# Patient Record
Sex: Male | Born: 1945 | Race: Black or African American | Hispanic: No | Marital: Married | Smoking: Never smoker
Health system: Southern US, Community
[De-identification: ages and names within clinical notes are randomized; demographics above are authoritative.]

## PROBLEM LIST (undated history)

## (undated) DIAGNOSIS — E119 Type 2 diabetes mellitus without complications: Secondary | ICD-10-CM

## (undated) DIAGNOSIS — I10 Essential (primary) hypertension: Secondary | ICD-10-CM

---

## 2005-08-11 ENCOUNTER — Inpatient Hospital Stay (HOSPITAL_COMMUNITY): Admission: EM | Admit: 2005-08-11 | Discharge: 2005-08-15 | Payer: Self-pay | Admitting: Emergency Medicine

## 2006-04-10 ENCOUNTER — Emergency Department (HOSPITAL_COMMUNITY): Admission: EM | Admit: 2006-04-10 | Discharge: 2006-04-10 | Payer: Self-pay | Admitting: Emergency Medicine

## 2006-05-31 IMAGING — CR DG CHEST 2V
2 series · 2 of 2 positions shown · non-contrast
Comparison: No available priors.

CLINICAL DATA: Weight loss, weakness, and productive cough.  
CHEST - 2 VIEW:

[view not recorded (1 of 2)]
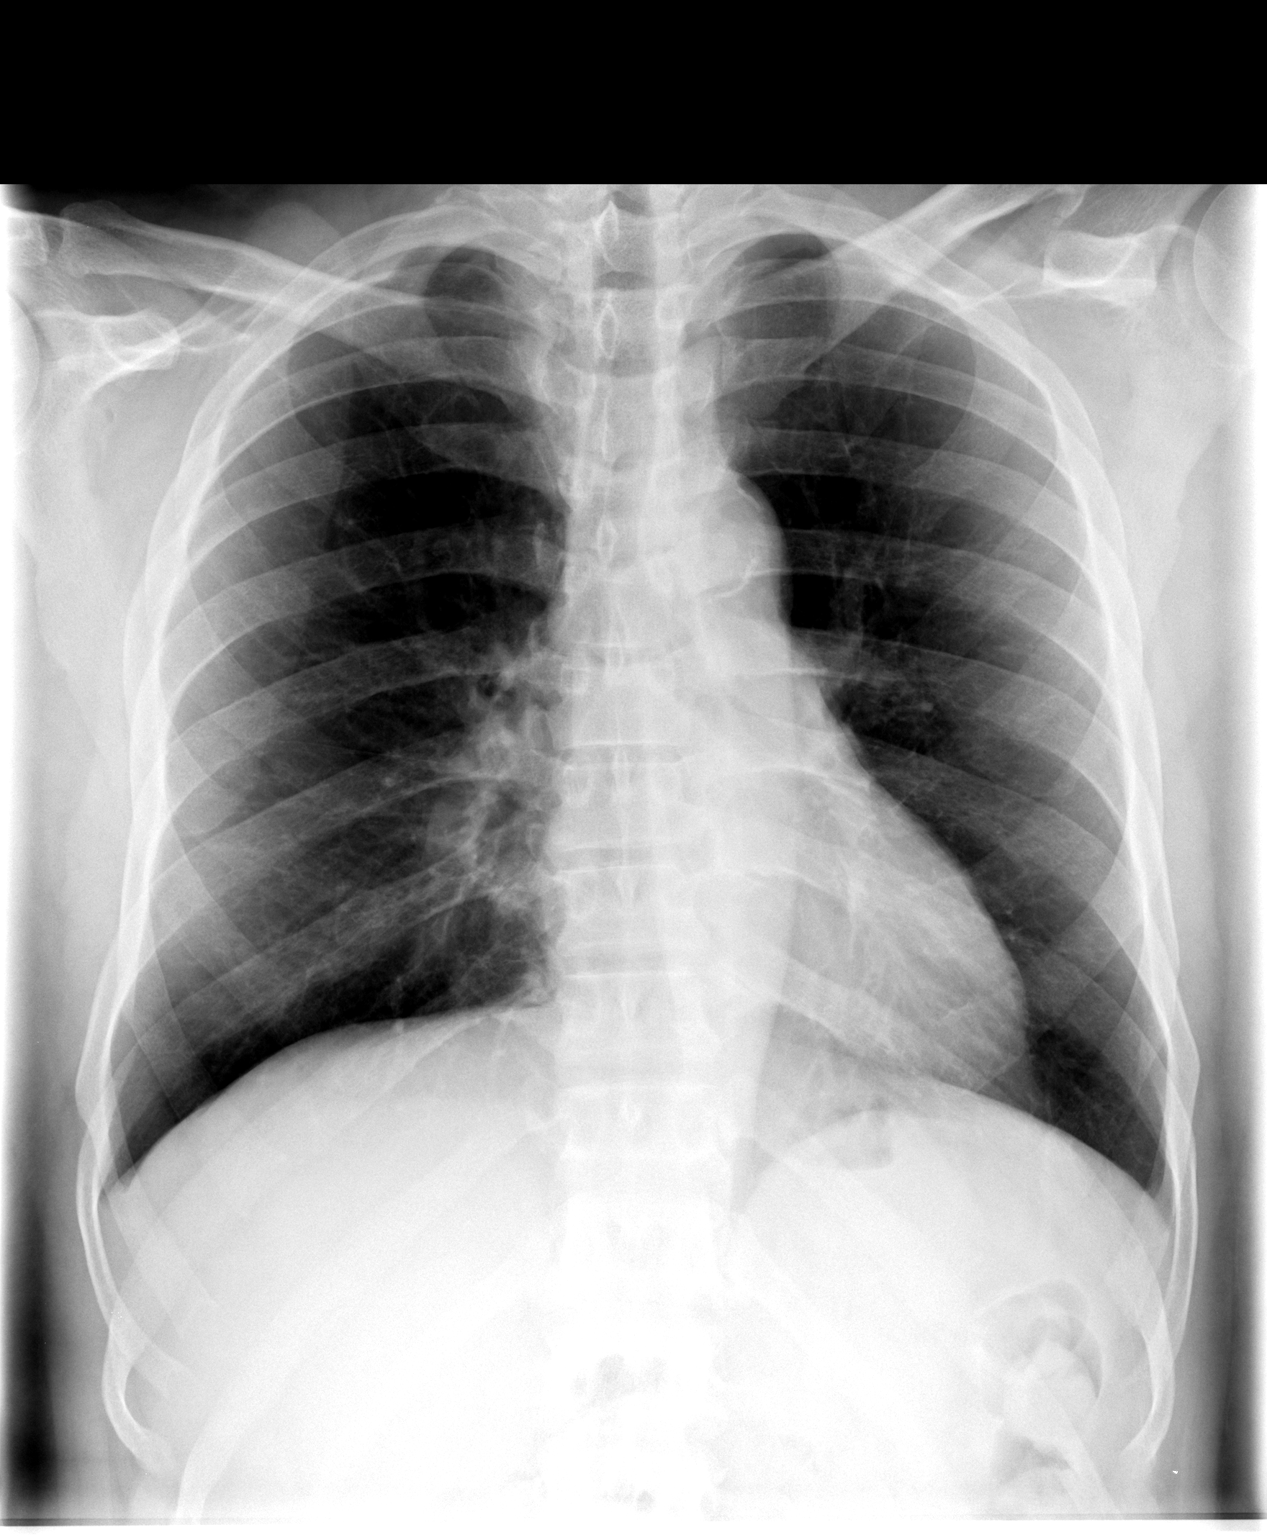

[view not recorded (2 of 2)]
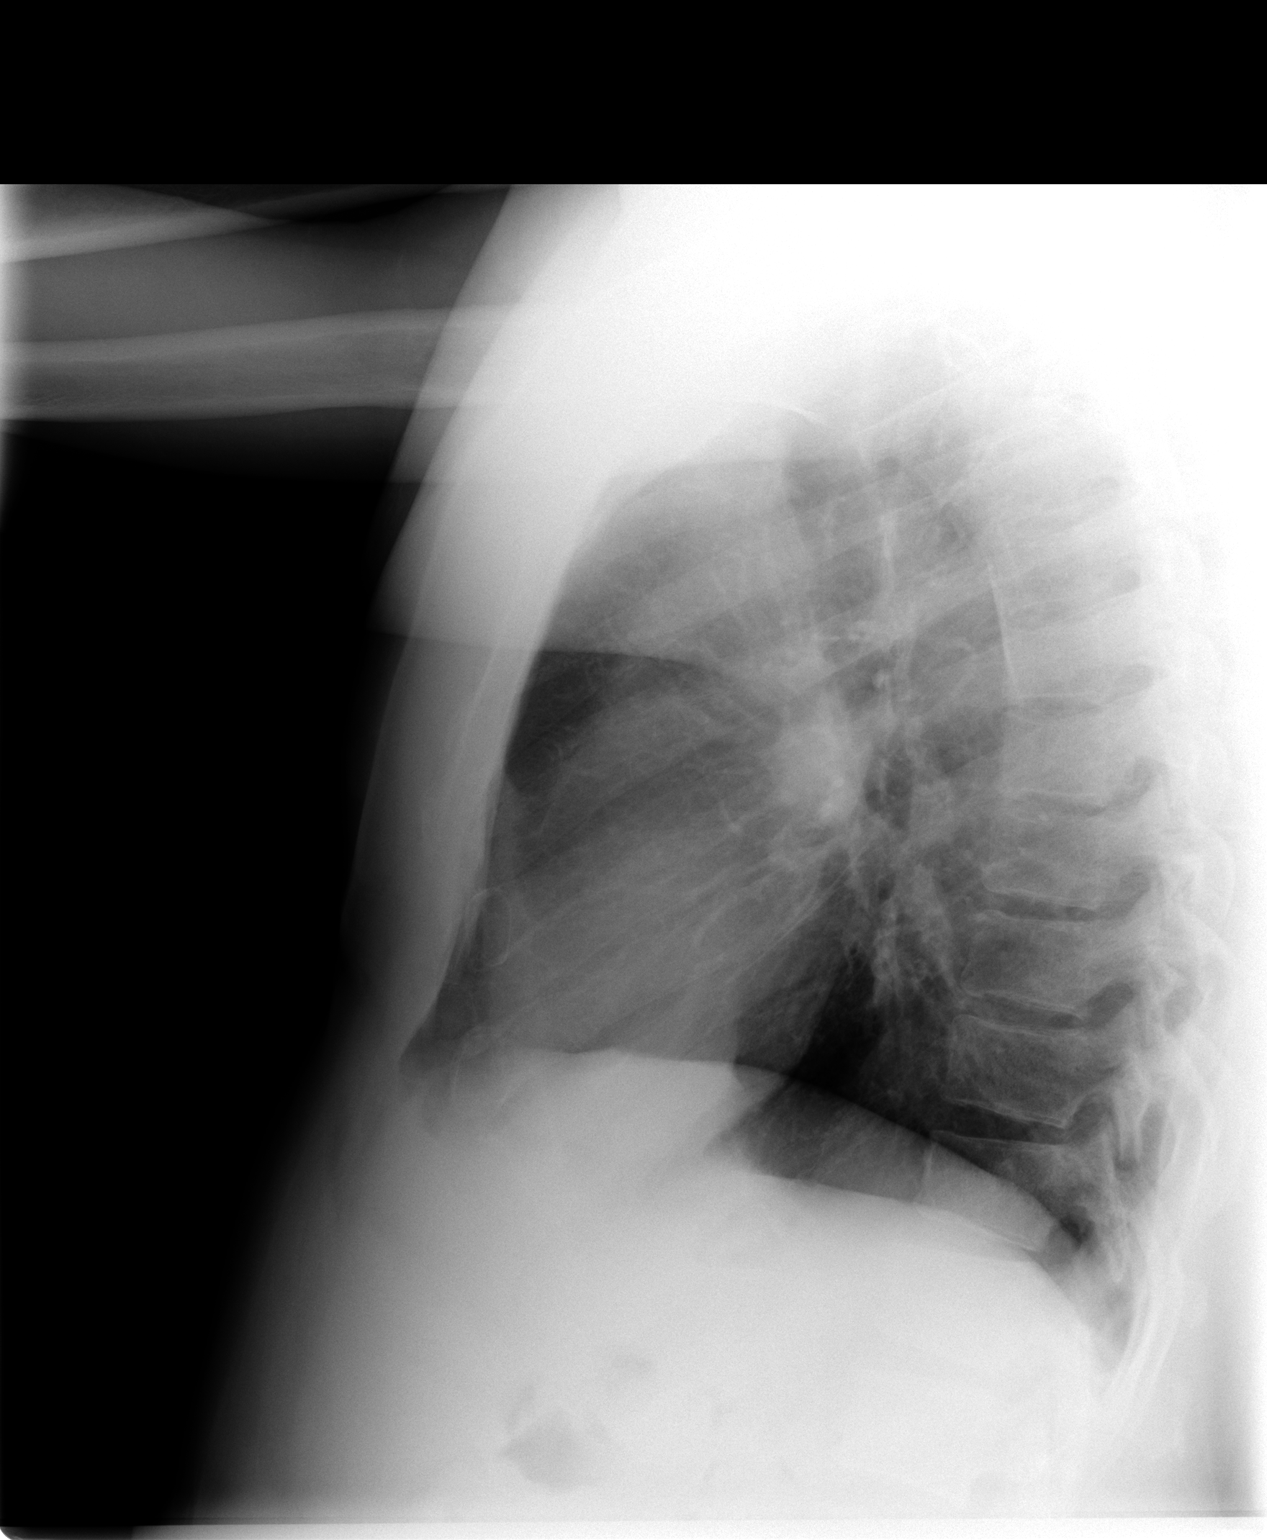

[2 of 2 positions shown; findings below may reference images not displayed]

FINDINGS: Trachea is midline.  Heart size is within normal limits.  Lungs are clear.  No pleural fluid.
IMPRESSION: No acute cardiopulmonary process.

## 2021-10-05 ENCOUNTER — Emergency Department (HOSPITAL_COMMUNITY)
Admission: EM | Admit: 2021-10-05 | Discharge: 2021-10-05 | Disposition: A | Discharge status: Home / Self Care | Payer: Medicare PPO | Attending: Emergency Medicine | Admitting: Emergency Medicine

## 2021-10-05 ENCOUNTER — Encounter (HOSPITAL_COMMUNITY): Payer: Self-pay | Admitting: *Deleted

## 2021-10-05 DIAGNOSIS — I10 Essential (primary) hypertension: Secondary | ICD-10-CM | POA: Diagnosis not present

## 2021-10-05 DIAGNOSIS — R2232 Localized swelling, mass and lump, left upper limb: Secondary | ICD-10-CM | POA: Insufficient documentation

## 2021-10-05 DIAGNOSIS — R030 Elevated blood-pressure reading, without diagnosis of hypertension: Secondary | ICD-10-CM | POA: Insufficient documentation

## 2021-10-05 HISTORY — DX: Essential (primary) hypertension: I10

## 2021-10-05 LAB — CBC WITH DIFFERENTIAL/PLATELET
Abs Immature Granulocytes: 0.04 10*3/uL (ref 0.00–0.07)
Basophils Absolute: 0 10*3/uL (ref 0.0–0.1)
Basophils Relative: 0 %
Eosinophils Absolute: 0.3 10*3/uL (ref 0.0–0.5)
Eosinophils Relative: 3 %
HCT: 36.1 % — ABNORMAL LOW (ref 39.0–52.0)
Hemoglobin: 11.8 g/dL — ABNORMAL LOW (ref 13.0–17.0)
Immature Granulocytes: 0 %
Lymphocytes Relative: 18 %
Lymphs Abs: 1.7 10*3/uL (ref 0.7–4.0)
MCH: 30.1 pg (ref 26.0–34.0)
MCHC: 32.7 g/dL (ref 30.0–36.0)
MCV: 92.1 fL (ref 80.0–100.0)
Monocytes Absolute: 0.9 10*3/uL (ref 0.1–1.0)
Monocytes Relative: 10 %
Neutro Abs: 6.8 10*3/uL (ref 1.7–7.7)
Neutrophils Relative %: 69 %
Platelets: 230 10*3/uL (ref 150–400)
RBC: 3.92 MIL/uL — ABNORMAL LOW (ref 4.22–5.81)
RDW: 14.4 % (ref 11.5–15.5)
WBC: 9.8 10*3/uL (ref 4.0–10.5)
nRBC: 0 % (ref 0.0–0.2)

## 2021-10-05 LAB — BASIC METABOLIC PANEL
Anion gap: 7 (ref 5–15)
BUN: 24 mg/dL — ABNORMAL HIGH (ref 8–23)
CO2: 26 mmol/L (ref 22–32)
Calcium: 9.8 mg/dL (ref 8.9–10.3)
Chloride: 103 mmol/L (ref 98–111)
Creatinine, Ser: 1.52 mg/dL — ABNORMAL HIGH (ref 0.61–1.24)
GFR, Estimated: 47 mL/min — ABNORMAL LOW (ref >60)
Glucose, Bld: 119 mg/dL — ABNORMAL HIGH (ref 70–99)
Potassium: 3.7 mmol/L (ref 3.5–5.1)
Sodium: 136 mmol/L (ref 135–145)

## 2021-10-05 MED ORDER — TRIAMCINOLONE ACETONIDE 0.025 % EX OINT: TOPICAL_OINTMENT | Freq: Two times a day (BID) | CUTANEOUS | 0 refills | Status: DC | PRN

## 2021-10-05 NOTE — ED Triage Notes (Signed)
States he had his blood pressure checked at work and it was up, states he does not have a lot of energy

## 2021-10-05 NOTE — Discharge Instructions (Addendum)
Exam was reassuring, your creatinine was slightly elevated, recommend continued stay hydrated, please continue with all medication as prescribed to you.  I have given a steroid cream please use as needed for the itchiness on your hand.  You may follow-up with your PCP for further evaluation of your blood pressure as well as your creatinine recommend follow-up in next 3 weeks time.  Come back to the emergency department if you develop chest pain, shortness of breath, severe abdominal pain, uncontrolled nausea, vomiting, diarrhea.

## 2021-10-05 NOTE — ED Triage Notes (Signed)
Patient has his blood pressure checked at the [employer] where he works and states it has been high at intervals. Recent change to blood pressure medication by PCP
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
## 2023-10-09 ENCOUNTER — Emergency Department (HOSPITAL_COMMUNITY): Payer: Medicare PPO

## 2023-10-09 ENCOUNTER — Other Ambulatory Visit: Payer: Self-pay

## 2023-10-09 ENCOUNTER — Emergency Department (HOSPITAL_COMMUNITY)
Admission: EM | Admit: 2023-10-09 | Discharge: 2023-10-09 | Disposition: A | Discharge status: Home / Self Care | Payer: Medicare PPO | Attending: Emergency Medicine | Admitting: Emergency Medicine

## 2023-10-09 ENCOUNTER — Encounter (HOSPITAL_COMMUNITY): Payer: Self-pay

## 2023-10-09 DIAGNOSIS — M79604 Pain in right leg: Secondary | ICD-10-CM | POA: Diagnosis present

## 2023-10-09 MED ORDER — IBUPROFEN 800 MG PO TABS: 800.0000 mg | ORAL_TABLET | Freq: Three times a day (TID) | ORAL | 0 refills | Status: DC | PRN

## 2023-10-09 MED ORDER — IBUPROFEN 800 MG PO TABS
800.0000 mg | ORAL_TABLET | Freq: Once | ORAL | Status: AC
  Administered 2023-10-09: 800 mg via ORAL
  Filled 2023-10-09: qty 1

## 2023-10-09 NOTE — ED Provider Notes (Signed)
EMERGENCY DEPARTMENT AT [facility] Provider Note   CSN: [id number] Arrival date & time: 10/09/23  0848     History  Chief Complaint  Patient presents with   Leg Pain    Manuel Winters is a 77 y.o. male.  Patient complains of right lower leg pain for the last couple weeks.  Patient has no medical problems  The history is provided by the patient and medical records.  Leg Pain Location:  Leg Injury: no   Leg location:  R leg Pain details:    Quality:  Aching   Radiates to:  Does not radiate   Severity:  Moderate   Onset quality:  Sudden   Timing:  Constant   Progression:  Worsening Chronicity:  New Foreign body present:  No foreign bodies Associated symptoms: no back pain and no fatigue        Home Medications Prior to Admission medications   Medication Sig Start Date End Date Taking? Authorizing Provider  ibuprofen (ADVIL) 800 MG tablet Take 1 tablet (800 mg total) by mouth every 8 (eight) hours as needed. 10/09/23  Yes Bethann Berkshire, MD  triamcinolone oint 0.025%-Cerave equivalent cream 1:1 mixture Apply topically 2 (two) times daily as needed for itching. 10/05/21   Carroll Sage, PA-C      Allergies    Patient has no known allergies.    Review of Systems   Review of Systems  Constitutional:  Negative for appetite change and fatigue.  HENT:  Negative for congestion, ear discharge and sinus pressure.   Eyes:  Negative for discharge.  Respiratory:  Negative for cough.   Cardiovascular:  Negative for chest pain.  Gastrointestinal:  Negative for abdominal pain and diarrhea.  Genitourinary:  Negative for frequency and hematuria.  Musculoskeletal:  Negative for back pain.       Right lower leg pain  Skin:  Negative for rash.  Neurological:  Negative for seizures and headaches.  Psychiatric/Behavioral:  Negative for hallucinations.     Physical Exam Updated Vital Signs BP (!) 164/83   Pulse 68   Temp 98.3 F (36.8 C) (Oral)    Resp 18   Ht 5\' 8"  (1.727 m)   Wt 73.5 kg   SpO2 99%   BMI 24.63 kg/m  Physical Exam Vitals and nursing note reviewed.  Constitutional:      Appearance: He is well-developed.  HENT:     Head: Normocephalic.     Nose: Nose normal.  Eyes:     General: No scleral icterus.    Conjunctiva/sclera: Conjunctivae normal.  Neck:     Thyroid: No thyromegaly.  Cardiovascular:     Rate and Rhythm: Normal rate and regular rhythm.     Heart sounds: No murmur heard.    No friction rub. No gallop.  Pulmonary:     Breath sounds: No stridor. No wheezing or rales.  Chest:     Chest wall: No tenderness.  Abdominal:     General: There is no distension.     Tenderness: There is no abdominal tenderness. There is no rebound.  Musculoskeletal:        General: Normal range of motion.     Cervical back: Neck supple.     Comments: Tender right calf  Lymphadenopathy:     Cervical: No cervical adenopathy.  Skin:    Findings: No erythema or rash.  Neurological:     Mental Status: He is alert and oriented to person, place, and time.  Motor: No abnormal muscle tone.     Coordination: Coordination normal.  Psychiatric:        Behavior: Behavior normal.     ED Results / Procedures / Treatments   Labs (all labs ordered are listed, but only abnormal results are displayed) Labs Reviewed - No data to display  EKG None  Radiology US Venous Img Lower Unilateral Right Result Date: 10/09/2023 CLINICAL DATA:  Right lower leg pain. EXAM: RIGHT LOWER EXTREMITY VENOUS DOPPLER ULTRASOUND TECHNIQUE: Gray-scale sonography with compression, as well as color and duplex ultrasound, were performed to evaluate the deep venous system(s) from the level of the common femoral vein through the popliteal and proximal calf veins. COMPARISON:  None Available. FINDINGS: VENOUS Normal compressibility of the common femoral, superficial femoral, and popliteal veins, as well as the visualized calf veins. Visualized  portions of profunda femoral vein and great saphenous vein unremarkable. No filling defects to suggest DVT on grayscale or color Doppler imaging. Doppler waveforms show normal direction of venous flow, normal respiratory plasticity and response to augmentation. Limited views of the contralateral common femoral vein are unremarkable. OTHER None. Limitations: None. IMPRESSION: Negative. Electronically Signed   By: Obie Dredge M.D.   On: 10/09/2023 11:30   DG Tibia/Fibula Right Result Date: 10/09/2023 CLINICAL DATA:  Pain EXAM: RIGHT TIBIA AND FIBULA - 2 VIEW COMPARISON:  None Available. FINDINGS: No fracture or dislocation. Preserved joint spaces and bone mineralization. There is a tiny radiopaque density seen in the soft tissues posterior to the distal fibula. Please correlate for a foreign body. IMPRESSION: No acute osseous abnormality. Possible radiopaque foreign body in the soft tissues posterior to the distal fibula. Electronically Signed   By: Karen Kays M.D.   On: 10/09/2023 10:37    Procedures Procedures    Medications Ordered in ED Medications  ibuprofen (ADVIL) tablet 800 mg (has no administration in time range)    ED Course/ Medical Decision Making/ A&P                                 Medical Decision Making Amount and/or Complexity of Data Reviewed Radiology: ordered.  Risk Prescription drug management.   Patient with painful right lower leg.  DVT ruled out.  Patient given Motrin and will follow-up with PCP        Final Clinical Impression(s) / ED Diagnoses Final diagnoses:  Right leg pain    Rx / DC Orders ED Discharge Orders          Ordered    ibuprofen (ADVIL) 800 MG tablet  Every 8 hours PRN        10/09/23 1359              Bethann Berkshire, MD 10/09/23 1727
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
## 2023-10-09 NOTE — Discharge Instructions (Signed)
Follow-up with your family doctor in 1 to 2 weeks for recheck 

## 2023-11-05 ENCOUNTER — Inpatient Hospital Stay (HOSPITAL_COMMUNITY)
Admission: EM | Admit: 2023-11-05 | Discharge: 2023-11-17 | DRG: 253 | Disposition: A | Discharge status: Home Health | Payer: Medicare PPO | Attending: Internal Medicine | Admitting: Internal Medicine

## 2023-11-05 ENCOUNTER — Emergency Department (HOSPITAL_COMMUNITY): Payer: Medicare PPO

## 2023-11-05 ENCOUNTER — Other Ambulatory Visit: Payer: Self-pay

## 2023-11-05 ENCOUNTER — Encounter (HOSPITAL_COMMUNITY): Payer: Self-pay

## 2023-11-05 DIAGNOSIS — I82441 Acute embolism and thrombosis of right tibial vein: Secondary | ICD-10-CM | POA: Diagnosis present

## 2023-11-05 DIAGNOSIS — E1122 Type 2 diabetes mellitus with diabetic chronic kidney disease: Secondary | ICD-10-CM | POA: Diagnosis present

## 2023-11-05 DIAGNOSIS — Z7901 Long term (current) use of anticoagulants: Secondary | ICD-10-CM

## 2023-11-05 DIAGNOSIS — E1151 Type 2 diabetes mellitus with diabetic peripheral angiopathy without gangrene: Secondary | ICD-10-CM | POA: Diagnosis present

## 2023-11-05 DIAGNOSIS — K219 Gastro-esophageal reflux disease without esophagitis: Secondary | ICD-10-CM | POA: Diagnosis present

## 2023-11-05 DIAGNOSIS — R509 Fever, unspecified: Secondary | ICD-10-CM | POA: Diagnosis not present

## 2023-11-05 DIAGNOSIS — N179 Acute kidney failure, unspecified: Secondary | ICD-10-CM | POA: Diagnosis present

## 2023-11-05 DIAGNOSIS — E785 Hyperlipidemia, unspecified: Secondary | ICD-10-CM | POA: Diagnosis present

## 2023-11-05 DIAGNOSIS — I129 Hypertensive chronic kidney disease with stage 1 through stage 4 chronic kidney disease, or unspecified chronic kidney disease: Secondary | ICD-10-CM | POA: Diagnosis present

## 2023-11-05 DIAGNOSIS — I480 Paroxysmal atrial fibrillation: Secondary | ICD-10-CM | POA: Diagnosis not present

## 2023-11-05 DIAGNOSIS — I5A Non-ischemic myocardial injury (non-traumatic): Secondary | ICD-10-CM | POA: Diagnosis not present

## 2023-11-05 DIAGNOSIS — I70221 Atherosclerosis of native arteries of extremities with rest pain, right leg: Principal | ICD-10-CM | POA: Diagnosis present

## 2023-11-05 DIAGNOSIS — I70209 Unspecified atherosclerosis of native arteries of extremities, unspecified extremity: Secondary | ICD-10-CM | POA: Diagnosis present

## 2023-11-05 DIAGNOSIS — M6282 Rhabdomyolysis: Secondary | ICD-10-CM | POA: Diagnosis present

## 2023-11-05 DIAGNOSIS — Z79899 Other long term (current) drug therapy: Secondary | ICD-10-CM | POA: Diagnosis not present

## 2023-11-05 DIAGNOSIS — I82431 Acute embolism and thrombosis of right popliteal vein: Secondary | ICD-10-CM | POA: Diagnosis present

## 2023-11-05 DIAGNOSIS — I4892 Unspecified atrial flutter: Secondary | ICD-10-CM | POA: Diagnosis present

## 2023-11-05 DIAGNOSIS — K5900 Constipation, unspecified: Secondary | ICD-10-CM | POA: Diagnosis not present

## 2023-11-05 DIAGNOSIS — I82411 Acute embolism and thrombosis of right femoral vein: Secondary | ICD-10-CM | POA: Diagnosis present

## 2023-11-05 DIAGNOSIS — N1831 Chronic kidney disease, stage 3a: Secondary | ICD-10-CM | POA: Diagnosis present

## 2023-11-05 DIAGNOSIS — M48061 Spinal stenosis, lumbar region without neurogenic claudication: Secondary | ICD-10-CM | POA: Diagnosis present

## 2023-11-05 DIAGNOSIS — E8809 Other disorders of plasma-protein metabolism, not elsewhere classified: Secondary | ICD-10-CM | POA: Diagnosis present

## 2023-11-05 DIAGNOSIS — R7989 Other specified abnormal findings of blood chemistry: Secondary | ICD-10-CM | POA: Diagnosis not present

## 2023-11-05 DIAGNOSIS — I10 Essential (primary) hypertension: Secondary | ICD-10-CM | POA: Diagnosis not present

## 2023-11-05 DIAGNOSIS — I4891 Unspecified atrial fibrillation: Secondary | ICD-10-CM | POA: Diagnosis present

## 2023-11-05 DIAGNOSIS — D62 Acute posthemorrhagic anemia: Secondary | ICD-10-CM | POA: Diagnosis not present

## 2023-11-05 DIAGNOSIS — N170 Acute kidney failure with tubular necrosis: Secondary | ICD-10-CM | POA: Diagnosis present

## 2023-11-05 DIAGNOSIS — I743 Embolism and thrombosis of arteries of the lower extremities: Secondary | ICD-10-CM | POA: Diagnosis not present

## 2023-11-05 DIAGNOSIS — M21371 Foot drop, right foot: Secondary | ICD-10-CM | POA: Diagnosis present

## 2023-11-05 DIAGNOSIS — I998 Other disorder of circulatory system: Secondary | ICD-10-CM | POA: Diagnosis not present

## 2023-11-05 DIAGNOSIS — E876 Hypokalemia: Secondary | ICD-10-CM | POA: Diagnosis present

## 2023-11-05 DIAGNOSIS — E872 Acidosis, unspecified: Secondary | ICD-10-CM | POA: Diagnosis present

## 2023-11-05 DIAGNOSIS — E119 Type 2 diabetes mellitus without complications: Secondary | ICD-10-CM | POA: Diagnosis not present

## 2023-11-05 DIAGNOSIS — H409 Unspecified glaucoma: Secondary | ICD-10-CM | POA: Diagnosis present

## 2023-11-05 HISTORY — DX: Type 2 diabetes mellitus without complications: E11.9

## 2023-11-05 LAB — CBC WITH DIFFERENTIAL/PLATELET
Abs Immature Granulocytes: 0.04 10*3/uL (ref 0.00–0.07)
Basophils Absolute: 0 10*3/uL (ref 0.0–0.1)
Basophils Relative: 0 %
Eosinophils Absolute: 0 10*3/uL (ref 0.0–0.5)
Eosinophils Relative: 0 %
HCT: 39.5 % (ref 39.0–52.0)
Hemoglobin: 13.3 g/dL (ref 13.0–17.0)
Immature Granulocytes: 0 %
Lymphocytes Relative: 15 %
Lymphs Abs: 1.5 10*3/uL (ref 0.7–4.0)
MCH: 29.5 pg (ref 26.0–34.0)
MCHC: 33.7 g/dL (ref 30.0–36.0)
MCV: 87.6 fL (ref 80.0–100.0)
Monocytes Absolute: 0.7 10*3/uL (ref 0.1–1.0)
Monocytes Relative: 6 %
Neutro Abs: 8 10*3/uL — ABNORMAL HIGH (ref 1.7–7.7)
Neutrophils Relative %: 79 %
Platelets: 244 10*3/uL (ref 150–400)
RBC: 4.51 MIL/uL (ref 4.22–5.81)
RDW: 13.2 % (ref 11.5–15.5)
WBC: 10.2 10*3/uL (ref 4.0–10.5)
nRBC: 0 % (ref 0.0–0.2)

## 2023-11-05 LAB — BASIC METABOLIC PANEL
Anion gap: 14 (ref 5–15)
BUN: 43 mg/dL — ABNORMAL HIGH (ref 8–23)
CO2: 18 mmol/L — ABNORMAL LOW (ref 22–32)
Calcium: 10.4 mg/dL — ABNORMAL HIGH (ref 8.9–10.3)
Chloride: 102 mmol/L (ref 98–111)
Creatinine, Ser: 2.19 mg/dL — ABNORMAL HIGH (ref 0.61–1.24)
GFR, Estimated: 30 mL/min — ABNORMAL LOW (ref >60)
Glucose, Bld: 151 mg/dL — ABNORMAL HIGH (ref 70–99)
Potassium: 3.9 mmol/L (ref 3.5–5.1)
Sodium: 134 mmol/L — ABNORMAL LOW (ref 135–145)

## 2023-11-05 LAB — LACTIC ACID, PLASMA
Lactic Acid, Venous: 1.9 mmol/L (ref 0.5–1.9)
Lactic Acid, Venous: 2.1 mmol/L (ref 0.5–1.9)

## 2023-11-05 LAB — HEPATIC FUNCTION PANEL
ALT: 19 U/L (ref 0–44)
AST: 114 U/L — ABNORMAL HIGH (ref 15–41)
Albumin: 3.7 g/dL (ref 3.5–5.0)
Alkaline Phosphatase: 60 U/L (ref 38–126)
Bilirubin, Direct: 0.1 mg/dL (ref 0.0–0.2)
Indirect Bilirubin: 0.5 mg/dL (ref 0.3–0.9)
Total Bilirubin: 0.6 mg/dL (ref 0.0–1.2)
Total Protein: 8.3 g/dL — ABNORMAL HIGH (ref 6.5–8.1)

## 2023-11-05 LAB — MAGNESIUM: Magnesium: 1.8 mg/dL (ref 1.7–2.4)

## 2023-11-05 LAB — TROPONIN I (HIGH SENSITIVITY)
Troponin I (High Sensitivity): 22 ng/L — ABNORMAL HIGH (ref <18)
Troponin I (High Sensitivity): 26 ng/L — ABNORMAL HIGH (ref <18)

## 2023-11-05 LAB — PROTIME-INR
INR: 1.1 (ref 0.8–1.2)
Prothrombin Time: 14.2 s (ref 11.4–15.2)

## 2023-11-05 MED ORDER — SODIUM CHLORIDE 0.9 % IV BOLUS
500.0000 mL | Freq: Once | INTRAVENOUS | Status: AC
  Administered 2023-11-05: 500 mL via INTRAVENOUS

## 2023-11-05 MED ORDER — FENTANYL CITRATE PF 50 MCG/ML IJ SOSY
50.0000 ug | PREFILLED_SYRINGE | Freq: Once | INTRAMUSCULAR | Status: AC
  Administered 2023-11-05: 50 ug via INTRAVENOUS
  Filled 2023-11-05: qty 1

## 2023-11-05 MED ORDER — LACTATED RINGERS IV BOLUS: 1000.0000 mL | Freq: Once | INTRAVENOUS | Status: DC

## 2023-11-05 MED ORDER — HYDROMORPHONE HCL 1 MG/ML IJ SOLN
0.5000 mg | INTRAMUSCULAR | Status: DC | PRN
  Administered 2023-11-06 – 2023-11-15 (×20): 0.5 mg via INTRAVENOUS
  Filled 2023-11-05 (×20): qty 0.5

## 2023-11-05 MED ORDER — ACETAMINOPHEN 500 MG PO TABS
1000.0000 mg | ORAL_TABLET | Freq: Four times a day (QID) | ORAL | Status: DC | PRN
  Administered 2023-11-12 – 2023-11-13 (×3): 1000 mg via ORAL
  Filled 2023-11-05 (×3): qty 2

## 2023-11-05 MED ORDER — DILTIAZEM HCL-DEXTROSE 125-5 MG/125ML-% IV SOLN (PREMIX)
5.0000 mg/h | INTRAVENOUS | Status: AC
  Administered 2023-11-05: 5 mg/h via INTRAVENOUS
  Filled 2023-11-05: qty 125

## 2023-11-05 MED ORDER — LATANOPROST 0.005 % OP SOLN
1.0000 [drp] | Freq: Every day | OPHTHALMIC | Status: DC
  Administered 2023-11-05 – 2023-11-16 (×11): 1 [drp] via OPHTHALMIC
  Filled 2023-11-05 (×2): qty 2.5

## 2023-11-05 MED ORDER — SODIUM CHLORIDE 0.9 % IV SOLN: INTRAVENOUS | Status: DC

## 2023-11-05 MED ORDER — DILTIAZEM HCL 30 MG PO TABS
60.0000 mg | ORAL_TABLET | Freq: Four times a day (QID) | ORAL | Status: DC
  Administered 2023-11-05 – 2023-11-07 (×3): 60 mg via ORAL
  Filled 2023-11-05: qty 1
  Filled 2023-11-05: qty 2
  Filled 2023-11-05: qty 1
  Filled 2023-11-05: qty 2

## 2023-11-05 MED ORDER — OXYCODONE HCL 5 MG PO TABS
5.0000 mg | ORAL_TABLET | ORAL | Status: DC | PRN
  Administered 2023-11-06 – 2023-11-08 (×4): 5 mg via ORAL
  Filled 2023-11-05 (×3): qty 1

## 2023-11-05 MED ORDER — HEPARIN (PORCINE) 25000 UT/250ML-% IV SOLN
1300.0000 [IU]/h | INTRAVENOUS | Status: DC
  Administered 2023-11-05: 1250 [IU]/h via INTRAVENOUS
  Administered 2023-11-07: 1200 [IU]/h via INTRAVENOUS
  Administered 2023-11-08: 1050 [IU]/h via INTRAVENOUS
  Administered 2023-11-09: 1100 [IU]/h via INTRAVENOUS
  Administered 2023-11-11 – 2023-11-12 (×2): 1300 [IU]/h via INTRAVENOUS
  Filled 2023-11-05 (×9): qty 250

## 2023-11-05 MED ORDER — HYDROMORPHONE HCL 1 MG/ML IJ SOLN
1.0000 mg | Freq: Once | INTRAMUSCULAR | Status: AC
  Administered 2023-11-05: 1 mg via INTRAVENOUS
  Filled 2023-11-05: qty 1

## 2023-11-05 MED ORDER — HYDROMORPHONE HCL 1 MG/ML IJ SOLN
0.5000 mg | Freq: Once | INTRAMUSCULAR | Status: AC
  Administered 2023-11-05: 0.5 mg via INTRAVENOUS
  Filled 2023-11-05: qty 0.5

## 2023-11-05 MED ORDER — IOHEXOL 350 MG/ML SOLN
80.0000 mL | Freq: Once | INTRAVENOUS | Status: AC | PRN
  Administered 2023-11-05: 100 mL via INTRAVENOUS

## 2023-11-05 MED ORDER — ROSUVASTATIN CALCIUM 5 MG PO TABS
10.0000 mg | ORAL_TABLET | Freq: Every day | ORAL | Status: DC
Start: 2023-11-05 — End: 2023-11-07
  Administered 2023-11-05 – 2023-11-06 (×2): 10 mg via ORAL
  Filled 2023-11-05: qty 2
  Filled 2023-11-05: qty 1

## 2023-11-05 MED ORDER — SODIUM CHLORIDE 0.9% FLUSH
3.0000 mL | Freq: Two times a day (BID) | INTRAVENOUS | Status: DC
  Administered 2023-11-05 – 2023-11-17 (×19): 3 mL via INTRAVENOUS

## 2023-11-05 MED ORDER — OXYCODONE HCL 5 MG PO TABS
2.5000 mg | ORAL_TABLET | ORAL | Status: DC | PRN
  Administered 2023-11-07: 2.5 mg via ORAL
  Filled 2023-11-05 (×2): qty 1

## 2023-11-05 MED ORDER — MELATONIN 3 MG PO TABS
6.0000 mg | ORAL_TABLET | Freq: Every evening | ORAL | Status: DC | PRN
  Administered 2023-11-09 – 2023-11-14 (×4): 6 mg via ORAL
  Filled 2023-11-05 (×4): qty 2

## 2023-11-05 MED ORDER — ONDANSETRON HCL 4 MG/2ML IJ SOLN
4.0000 mg | Freq: Once | INTRAMUSCULAR | Status: AC
  Administered 2023-11-05: 4 mg via INTRAVENOUS
  Filled 2023-11-05: qty 2

## 2023-11-05 MED ORDER — SODIUM CHLORIDE 0.9 % IV BOLUS
1000.0000 mL | Freq: Once | INTRAVENOUS | Status: AC
  Administered 2023-11-05: 1000 mL via INTRAVENOUS

## 2023-11-05 MED ORDER — HEPARIN BOLUS VIA INFUSION
5100.0000 [IU] | Freq: Once | INTRAVENOUS | Status: AC
  Administered 2023-11-05: 5100 [IU] via INTRAVENOUS

## 2023-11-05 MED ORDER — PANTOPRAZOLE SODIUM 40 MG PO TBEC
40.0000 mg | DELAYED_RELEASE_TABLET | Freq: Every day | ORAL | Status: DC
Start: 2023-11-06 — End: 2023-11-17
  Administered 2023-11-07 – 2023-11-17 (×11): 40 mg via ORAL
  Filled 2023-11-05 (×11): qty 1

## 2023-11-05 MED ORDER — ONDANSETRON HCL 4 MG/2ML IJ SOLN: 4.0000 mg | Freq: Four times a day (QID) | INTRAMUSCULAR | Status: DC | PRN

## 2023-11-05 MED ORDER — POLYETHYLENE GLYCOL 3350 17 G PO PACK
17.0000 g | PACK | Freq: Every day | ORAL | Status: DC | PRN
  Administered 2023-11-08 – 2023-11-11 (×4): 17 g via ORAL
  Filled 2023-11-05 (×4): qty 1

## 2023-11-05 MED ORDER — DILTIAZEM LOAD VIA INFUSION
10.0000 mg | Freq: Once | INTRAVENOUS | Status: AC
Start: 2023-11-05 — End: 2023-11-05
  Administered 2023-11-05: 10 mg via INTRAVENOUS
  Filled 2023-11-05: qty 10

## 2023-11-05 NOTE — ED Provider Notes (Signed)
[place] EMERGENCY DEPARTMENT AT [facility] Provider Note   CSN: [id number] Arrival date & time: 11/05/23  1701     History  Chief Complaint  Patient presents with   Foot Pain    Manuel Winters is a 78 y.o. male.  He is in the ER for severe right leg pain, has been ongoing for weeks but worse the past several days, he has been to multiple [facility] having x-rays, ultrasound of which has been normal, his daughter is at bedside, she states he had an MRI of his lumbar spine yesterday, they got the results today and have follow-up with back specialist tomorrow, MRI report shows L2-L3 disc bulge with indentation of the ventral thecal sac, lateral recess stenosis with mild abutment of the bilateral L3 nerve roots, L3-L4 disc bulge with biforaminal endplate hypertrophy, moderate left and moderate right foraminal stenosis with abutment of exiting L3 nerve roots; 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  He is having pain all the way down his leg into his foot.  No saddle esthesia or paresthesia, no bowel or bladder incontinence but not able to walk, has been taking NSAIDs and tramadol with no relief.  He has history of diabetes   Foot Pain       Home Medications Prior to Admission medications   Medication Sig Start Date End Date Taking? Authorizing Provider  ibuprofen (ADVIL) 800 MG tablet Take 1 tablet (800 mg total) by mouth every 8 (eight) hours as needed. 10/09/23   Bethann Berkshire, MD  triamcinolone oint 0.025%-Cerave equivalent cream 1:1 mixture Apply topically 2 (two) times daily as needed for itching. 10/05/21   Carroll Sage, PA-C      Allergies    Patient has no known allergies.    Review of Systems   Review of Systems  Physical Exam Updated Vital Signs BP (!) 155/90   Pulse (!) 137   Temp 98.1 F (36.7 C) (Oral)   Resp 14   Ht 5\' 8"   (1.727 m)   Wt 73.4 kg   BMI 24.60 kg/m  Physical Exam Vitals and nursing note reviewed.  Constitutional:      General: He is not in acute distress.    Appearance: He is well-developed.     Comments: Patient is obviously very uncomfortable in obvious pain and somewhat agitated  HENT:     Head: Normocephalic and atraumatic.     Mouth/Throat:     Mouth: Mucous membranes are moist.  Eyes:     Conjunctiva/sclera: Conjunctivae normal.  Cardiovascular:     Rate and Rhythm: Normal rate and regular rhythm.     Heart sounds: No murmur heard. Pulmonary:     Effort: Pulmonary effort is normal. No respiratory distress.     Breath sounds: Normal breath sounds.  Abdominal:     Palpations: Abdomen is soft.     Tenderness: There is no abdominal tenderness.  Musculoskeletal:        General: No swelling.     Cervical back: Neck supple.     Comments: Swelling or tenderness to bilateral lower extremities, DP and PT pulses intact bilaterally.  Skin:    General: Skin is warm and dry.     Capillary Refill: Capillary refill takes less than 2 seconds.     Comments: Bilateral feet are cool, right foot slightly more pale, left  Neurological:  General: No focal deficit present.     Mental Status: He is alert and oriented to person, place, and time.  Psychiatric:        Mood and Affect: Mood normal.     ED Results / Procedures / Treatments   Labs (all labs ordered are listed, but only abnormal results are displayed) Labs Reviewed  CBC WITH DIFFERENTIAL/PLATELET - Abnormal; Notable for the following components:      Result Value   Neutro Abs 8.0 (*)    All other components within normal limits  BASIC METABOLIC PANEL - Abnormal; Notable for the following components:   Sodium 134 (*)    CO2 18 (*)    Glucose, Bld 151 (*)    BUN 43 (*)    Creatinine, Ser 2.19 (*)    Calcium 10.4 (*)    GFR, Estimated 30 (*)    All other components within normal limits  TROPONIN I (HIGH SENSITIVITY) -  Abnormal; Notable for the following components:   Troponin I (High Sensitivity) 22 (*)    All other components within normal limits  MAGNESIUM  LACTIC ACID, PLASMA  LACTIC ACID, PLASMA    EKG EKG Interpretation Date/Time:  Thursday November 05 2023 18:23:51 EST Ventricular Rate:  145 PR Interval:    QRS Duration:  80 QT Interval:  294 QTC Calculation: 465 R Axis:   45  Text Interpretation: Atrial fibrillation with rapid V-rate Minimal ST depression, inferior leads Confirmed by Bethann Berkshire [phone]) on 11/05/2023 6:29:38 PM  Radiology No results found.  Procedures Procedures    Medications Ordered in ED Medications  diltiazem (CARDIZEM) 1 mg/mL load via infusion 10 mg (10 mg Intravenous Bolus from Bag 11/05/23 1851)    And  diltiazem (CARDIZEM) 125 mg in dextrose 5% 125 mL (1 mg/mL) infusion (5 mg/hr Intravenous New Bag/Given 11/05/23 1852)  sodium chloride 0.9 % bolus 500 mL (has no administration in time range)  iohexol (OMNIPAQUE) 350 MG/ML injection 80 mL (has no administration in time range)  lactated ringers bolus 1,000 mL (has no administration in time range)  ondansetron (ZOFRAN) injection 4 mg (4 mg Intravenous Given 11/05/23 1758)  fentaNYL (SUBLIMAZE) injection 50 mcg (50 mcg Intravenous Given 11/05/23 1757)  HYDROmorphone (DILAUDID) injection 0.5 mg (0.5 mg Intravenous Given 11/05/23 1848)    ED Course/ Medical Decision Making/ A&P                                 Medical Decision Making Diagnosis includes sciatica, vascular catastrophe, DVT, fracture, other ED course: Patient with severe right leg pain ongoing for weeks worse in the past several days.  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  He states doctor at home yesterday there was no sign of blood clot, no sign of arterial occlusion, this been ongoing for 2 and half weeks since a fall.  He  states he has felt like his foot has been cool since the fall.  On my initial evaluation patient in obvious pain, felt like I was able to palpate pulses but on reevaluation after pain control I am not able to palpate or Doppler pulses he is definitely having pain out of proportion to exam so we will get CTA for possible occlusion.  Signed out to [employer] PA-C at this time.  Dr. Estell Harpin  is going to evaluate patient as well.  Vascular has been consulted.  Patient also noted to have increase in his baseline creatinine, daughter states he has not been eating or drinking well.  IV fluids ordered.  Patient also noted to be in new onset A-fib, rate controlled with Cardizem at this time     Amount and/or Complexity of Data Reviewed Labs: ordered. Radiology: ordered.  Risk Prescription drug management.           Final Clinical Impression(s) / ED Diagnoses Final diagnoses:  None    Rx / DC Orders ED Discharge Orders     None         Josem Kaufmann 11/05/23 Janeth Rase, MD 11/07/23 1144

## 2023-11-05 NOTE — ED Provider Notes (Signed)
Patient is a handoff from [place], [place]. In short, patient here with right lower leg/foot pain for about 3 weeks. Has been evaluated several times without remarkable workup. Patient noted to be severely uncomfortable. Initially, CT angio of the legs not obtained as patient reportedly had pulses but on repeat evaluation, pulses not felt and unable to be found using doppler. Concern for arterial occlusion so CT angio ordered and currently pending.  Physical Exam  BP 126/73   Pulse (!) 118   Temp 98.1 F (36.7 C) (Oral)   Resp 16   Ht 5\' 8"  (1.727 m)   Wt 73.4 kg   SpO2 95%   BMI 24.60 kg/m   Physical Exam Vitals and nursing note reviewed.  Constitutional:      General: He is in acute distress.     Appearance: He is well-developed. He is not ill-appearing.     Comments: Patient is obviously very uncomfortable in obvious pain and somewhat agitated  HENT:     Head: Normocephalic and atraumatic.     Mouth/Throat:     Mouth: Mucous membranes are moist.  Eyes:     Conjunctiva/sclera: Conjunctivae normal.  Cardiovascular:     Rate and Rhythm: Normal rate and regular rhythm.     Pulses:          Dorsalis pedis pulses are 0 on the right side and 1+ on the left side.       Posterior tibial pulses are 0 on the right side and 1+ on the left side.     Heart sounds: No murmur heard.    Comments: Unable to palpate or doppler pulses in RLE. Pulmonary:     Effort: Pulmonary effort is normal. No respiratory distress.     Breath sounds: Normal breath sounds.  Abdominal:     Palpations: Abdomen is soft.     Tenderness: There is no abdominal tenderness.  Musculoskeletal:        General: No swelling.     Cervical back: Neck supple.  Skin:    General: Skin is warm and dry.     Capillary Refill: Capillary refill takes less than 2 seconds.     Comments: Bilateral feet are cool, right foot slightly more pale, left  Neurological:     General: No focal deficit present.     Mental Status: He is alert  and oriented to person, place, and time.  Psychiatric:        Mood and Affect: Mood normal.     Procedures  .Critical Care  Performed by: Smitty Knudsen, PA-C Authorized by: Smitty Knudsen, PA-C   Critical care provider statement:    Critical care time (minutes):  38   Critical care start time:  11/05/2023 9:30 PM   Critical care end time:  11/05/2023 10:08 PM   Critical care time was exclusive of:  Separately billable procedures and treating other patients   Critical care was necessary to treat or prevent imminent or life-threatening deterioration of the following conditions:  Circulatory failure (limb ischemia)   Critical care was time spent personally by me on the following activities:  Development of treatment plan with patient or surrogate, discussions with consultants, evaluation of patient's response to treatment, examination of patient, re-evaluation of patient's condition, ordering and review of radiographic studies and ordering and review of laboratory studies   I assumed direction of critical care for this patient from another provider in my specialty: no     Care discussed with: admitting  provider     ED Course / MDM    Medical Decision Making Amount and/or Complexity of Data Reviewed Labs: ordered. Radiology: ordered.  Risk Prescription drug management. Decision regarding hospitalization.   Patient is a handoff from [place], [place]. Pleas see her note for full HPI and physical exam findings. In short, patient here with right lower leg/foot pain for about 3 weeks. Has been evaluated several times without remarkable workup. Patient noted to be severely uncomfortable. Initially, CT angio of the legs not obtained as patient reportedly had pulses but on repeat evaluation, pulses not felt and unable to be found using doppler. Concern for arterial occlusion so CT angio ordered and currently pending.   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 Will consult vascular.  Spoke with Dr. Lenell Antu, vascular surgery, who advised having patient started on heparin and admitted to medicine at [facility] for vascular evaluation there.  Heparin initiated.  Spoke with Dr. Lazarus Salines, hospitalist, who will admit patient.        Smitty Knudsen, PA-C 11/05/23 2213    Bethann Berkshire, MD 11/07/23 1102

## 2023-11-05 NOTE — ED Triage Notes (Signed)
Pt reports he has been having pain in his right foot for about 3 weeks and has had a negative [place] and xray and was given an MRI yesterday and was told a nerve was pressing on something.

## 2023-11-05 NOTE — Progress Notes (Signed)
PHARMACY - ANTICOAGULATION CONSULT NOTE  Pharmacy Consult for Heparin Infusion Indication:  arterial occlusion  No Known Allergies  Patient Measurements: Height: 5\' 8"  (172.7 cm) Weight: 73.4 kg (161 lb 13.1 oz) IBW/kg (Calculated) : 68.4 Heparin Dosing Weight: 73.4 kg  Vital Signs: Temp: 98.1 F (36.7 C) (01/16 1719) Temp Source: Oral (01/16 1719) BP: 126/73 (01/16 2115) Pulse Rate: 118 (01/16 1915)  Labs: Recent Labs    11/05/23 1748 11/05/23 1831 11/05/23 1834 11/05/23 2020  HGB 13.3  --   --   --   HCT 39.5  --   --   --   PLT 244  --   --   --   CREATININE  --  2.19*  --   --   TROPONINIHS  --   --  22* 26*    Estimated Creatinine Clearance: 26.9 mL/min (A) (by C-G formula based on SCr of 2.19 mg/dL (H)).   Medical History: Past Medical History:  Diagnosis Date   Hypertension    Assessment: Patient is a 78yo male presenting with severe right leg pain. CT angiogram shows arterial occlusion. Reviewed home meds and no anticoagulants noted.Pharmacy consulted for Heparin dosing.  Goal of Therapy:  Heparin level 0.3-0.7 units/ml Monitor platelets by anticoagulation protocol: Yes   Plan:  Give 5100 units bolus x 1 Start heparin infusion at 1250 units/hr Check anti-Xa level in 8 hours and daily while on heparin Continue to monitor H&H and platelets  Clovia Cuff, PharmD, BCPS 11/05/2023 9:36 PM

## 2023-11-05 NOTE — ED Notes (Signed)
Critical Lactic 2.1 called by lab at 2232  Hospitalist phoned and no answer  EDP Zammit made aware

## 2023-11-06 ENCOUNTER — Other Ambulatory Visit (HOSPITAL_COMMUNITY): Payer: Medicare PPO

## 2023-11-06 ENCOUNTER — Inpatient Hospital Stay (HOSPITAL_COMMUNITY): Payer: Medicare PPO

## 2023-11-06 ENCOUNTER — Inpatient Hospital Stay (HOSPITAL_COMMUNITY): Payer: Medicare PPO | Admitting: Anesthesiology

## 2023-11-06 ENCOUNTER — Other Ambulatory Visit: Payer: Self-pay

## 2023-11-06 ENCOUNTER — Encounter (HOSPITAL_COMMUNITY): Payer: Self-pay | Admitting: Internal Medicine

## 2023-11-06 ENCOUNTER — Encounter (HOSPITAL_COMMUNITY)
Admission: EM | Disposition: A | Discharge status: Home Health | Payer: Self-pay | Source: Home / Self Care | Attending: Internal Medicine

## 2023-11-06 DIAGNOSIS — I4892 Unspecified atrial flutter: Secondary | ICD-10-CM | POA: Diagnosis not present

## 2023-11-06 DIAGNOSIS — I10 Essential (primary) hypertension: Secondary | ICD-10-CM

## 2023-11-06 DIAGNOSIS — I998 Other disorder of circulatory system: Secondary | ICD-10-CM

## 2023-11-06 DIAGNOSIS — I743 Embolism and thrombosis of arteries of the lower extremities: Secondary | ICD-10-CM | POA: Diagnosis not present

## 2023-11-06 DIAGNOSIS — I70209 Unspecified atherosclerosis of native arteries of extremities, unspecified extremity: Secondary | ICD-10-CM | POA: Diagnosis not present

## 2023-11-06 DIAGNOSIS — E119 Type 2 diabetes mellitus without complications: Secondary | ICD-10-CM | POA: Diagnosis not present

## 2023-11-06 HISTORY — PX: ENDARTERECTOMY FEMORAL: SHX5804

## 2023-11-06 HISTORY — PX: THROMBECTOMY FEMORAL ARTERY: SHX6406

## 2023-11-06 HISTORY — PX: THIGH FASCIOTOMY: SHX6718

## 2023-11-06 LAB — URINALYSIS, ROUTINE W REFLEX MICROSCOPIC
Bacteria, UA: NONE SEEN
Bilirubin Urine: NEGATIVE
Glucose, UA: NEGATIVE mg/dL
Ketones, ur: NEGATIVE mg/dL
Leukocytes,Ua: NEGATIVE
Nitrite: NEGATIVE
Protein, ur: NEGATIVE mg/dL
Specific Gravity, Urine: 1.029 (ref 1.005–1.030)
pH: 5 (ref 5.0–8.0)

## 2023-11-06 LAB — HIV ANTIBODY (ROUTINE TESTING W REFLEX): HIV Screen 4th Generation wRfx: NONREACTIVE

## 2023-11-06 LAB — CK: Total CK: 5733 U/L — ABNORMAL HIGH (ref 49–397)

## 2023-11-06 LAB — BASIC METABOLIC PANEL
Anion gap: 8 (ref 5–15)
BUN: 35 mg/dL — ABNORMAL HIGH (ref 8–23)
CO2: 19 mmol/L — ABNORMAL LOW (ref 22–32)
Calcium: 9.5 mg/dL (ref 8.9–10.3)
Chloride: 108 mmol/L (ref 98–111)
Creatinine, Ser: 1.59 mg/dL — ABNORMAL HIGH (ref 0.61–1.24)
GFR, Estimated: 44 mL/min — ABNORMAL LOW (ref >60)
Glucose, Bld: 134 mg/dL — ABNORMAL HIGH (ref 70–99)
Potassium: 4 mmol/L (ref 3.5–5.1)
Sodium: 135 mmol/L (ref 135–145)

## 2023-11-06 LAB — MAGNESIUM: Magnesium: 1.8 mg/dL (ref 1.7–2.4)

## 2023-11-06 LAB — POCT I-STAT 7, (LYTES, BLD GAS, ICA,H+H)
Acid-base deficit: 5 mmol/L — ABNORMAL HIGH (ref 0.0–2.0)
Bicarbonate: 19.2 mmol/L — ABNORMAL LOW (ref 20.0–28.0)
Calcium, Ion: 1.24 mmol/L (ref 1.15–1.40)
HCT: 26 % — ABNORMAL LOW (ref 39.0–52.0)
Hemoglobin: 8.8 g/dL — ABNORMAL LOW (ref 13.0–17.0)
O2 Saturation: 100 %
Patient temperature: 36.2
Potassium: 4 mmol/L (ref 3.5–5.1)
Sodium: 140 mmol/L (ref 135–145)
TCO2: 20 mmol/L — ABNORMAL LOW (ref 22–32)
pCO2 arterial: 31.5 mm[Hg] — ABNORMAL LOW (ref 32–48)
pH, Arterial: 7.388 (ref 7.35–7.45)
pO2, Arterial: 264 mm[Hg] — ABNORMAL HIGH (ref 83–108)

## 2023-11-06 LAB — CBC
HCT: 35.6 % — ABNORMAL LOW (ref 39.0–52.0)
Hemoglobin: 11.9 g/dL — ABNORMAL LOW (ref 13.0–17.0)
MCH: 29.5 pg (ref 26.0–34.0)
MCHC: 33.4 g/dL (ref 30.0–36.0)
MCV: 88.3 fL (ref 80.0–100.0)
Platelets: 229 K/uL (ref 150–400)
RBC: 4.03 MIL/uL — ABNORMAL LOW (ref 4.22–5.81)
RDW: 13.2 % (ref 11.5–15.5)
WBC: 9.1 K/uL (ref 4.0–10.5)
nRBC: 0 % (ref 0.0–0.2)

## 2023-11-06 LAB — ECHOCARDIOGRAM COMPLETE
AR max vel: 3 cm2
AV Area VTI: 3.26 cm2
AV Area mean vel: 2.82 cm2
AV Mean grad: 2.3 mm[Hg]
AV Peak grad: 4.4 mm[Hg]
Ao pk vel: 1.04 m/s
Area-P 1/2: 5.7 cm2
Height: 68 in
S' Lateral: 3.1 cm
Weight: 2560 [oz_av]

## 2023-11-06 LAB — PROTIME-INR
INR: 1.1 (ref 0.8–1.2)
Prothrombin Time: 14.5 s (ref 11.4–15.2)

## 2023-11-06 LAB — HEMOGLOBIN A1C
Hgb A1c MFr Bld: 7.2 % — ABNORMAL HIGH (ref 4.8–5.6)
Mean Plasma Glucose: 159.94 mg/dL

## 2023-11-06 LAB — LIPID PANEL
Cholesterol: 90 mg/dL (ref 0–200)
HDL: 40 mg/dL — ABNORMAL LOW (ref >40)
LDL Cholesterol: 40 mg/dL (ref 0–99)
Total CHOL/HDL Ratio: 2.3 {ratio}
Triglycerides: 50 mg/dL (ref <150)
VLDL: 10 mg/dL (ref 0–40)

## 2023-11-06 LAB — HEPARIN LEVEL (UNFRACTIONATED)
Heparin Unfractionated: 0.78 [IU]/mL — ABNORMAL HIGH (ref 0.30–0.70)
Heparin Unfractionated: >1.1 [IU]/mL — ABNORMAL HIGH (ref 0.30–0.70)

## 2023-11-06 LAB — SYPHILIS: RPR W/REFLEX TO RPR TITER AND TREPONEMAL ANTIBODIES, TRADITIONAL SCREENING AND DIAGNOSIS ALGORITHM: RPR Ser Ql: NONREACTIVE

## 2023-11-06 LAB — ABO/RH: ABO/RH(D): O POS

## 2023-11-06 LAB — POCT ACTIVATED CLOTTING TIME
Activated Clotting Time: 187 s
Activated Clotting Time: 233 s
Activated Clotting Time: 216 s

## 2023-11-06 LAB — GLUCOSE, CAPILLARY
Glucose-Capillary: 112 mg/dL — ABNORMAL HIGH (ref 70–99)
Glucose-Capillary: 213 mg/dL — ABNORMAL HIGH (ref 70–99)

## 2023-11-06 LAB — CBG MONITORING, ED: Glucose-Capillary: 119 mg/dL — ABNORMAL HIGH (ref 70–99)

## 2023-11-06 LAB — TYPE AND SCREEN
ABO/RH(D): O POS
Antibody Screen: NEGATIVE

## 2023-11-06 LAB — SURGICAL PCR SCREEN
MRSA, PCR: NEGATIVE
Staphylococcus aureus: POSITIVE — AB

## 2023-11-06 LAB — TSH: TSH: 1.15 u[IU]/mL (ref 0.350–4.500)

## 2023-11-06 LAB — PHOSPHORUS: Phosphorus: 3.1 mg/dL (ref 2.5–4.6)

## 2023-11-06 LAB — APTT: aPTT: 113 s — ABNORMAL HIGH (ref 24–36)

## 2023-11-06 SURGERY — THROMBECTOMY, ARTERY, FEMORAL
Anesthesia: General | Site: Leg Upper | Laterality: Right

## 2023-11-06 MED ORDER — LIDOCAINE 2% (20 MG/ML) 5 ML SYRINGE
INTRAMUSCULAR | Status: DC | PRN
  Administered 2023-11-06: 100 mg via INTRAVENOUS

## 2023-11-06 MED ORDER — PROPOFOL 10 MG/ML IV BOLUS
INTRAVENOUS | Status: AC
  Filled 2023-11-06: qty 20

## 2023-11-06 MED ORDER — FENTANYL CITRATE (PF) 250 MCG/5ML IJ SOLN
INTRAMUSCULAR | Status: DC | PRN
  Administered 2023-11-06: 25 ug via INTRAVENOUS
  Administered 2023-11-06 (×3): 50 ug via INTRAVENOUS
  Administered 2023-11-06: 25 ug via INTRAVENOUS
  Administered 2023-11-06: 50 ug via INTRAVENOUS

## 2023-11-06 MED ORDER — 0.9 % SODIUM CHLORIDE (POUR BTL) OPTIME
TOPICAL | Status: DC | PRN
  Administered 2023-11-06: 2000 mL

## 2023-11-06 MED ORDER — ONDANSETRON HCL 4 MG/2ML IJ SOLN: 4.0000 mg | Freq: Once | INTRAMUSCULAR | Status: DC | PRN

## 2023-11-06 MED ORDER — CHLORHEXIDINE GLUCONATE 0.12 % MT SOLN
OROMUCOSAL | Status: AC
  Administered 2023-11-06: 15 mL via OROMUCOSAL
  Filled 2023-11-06: qty 15

## 2023-11-06 MED ORDER — LACTATED RINGERS IV SOLN: INTRAVENOUS | Status: DC

## 2023-11-06 MED ORDER — FENTANYL CITRATE (PF) 250 MCG/5ML IJ SOLN
INTRAMUSCULAR | Status: AC
  Filled 2023-11-06: qty 5

## 2023-11-06 MED ORDER — ACETAMINOPHEN 10 MG/ML IV SOLN
1000.0000 mg | Freq: Once | INTRAVENOUS | Status: DC | PRN
  Administered 2023-11-06: 1000 mg via INTRAVENOUS

## 2023-11-06 MED ORDER — ROCURONIUM BROMIDE 10 MG/ML (PF) SYRINGE
PREFILLED_SYRINGE | INTRAVENOUS | Status: DC | PRN
  Administered 2023-11-06: 50 mg via INTRAVENOUS
  Administered 2023-11-06: 20 mg via INTRAVENOUS
  Administered 2023-11-06: 10 mg via INTRAVENOUS

## 2023-11-06 MED ORDER — CHLORHEXIDINE GLUCONATE 0.12 % MT SOLN: 15.0000 mL | Freq: Once | OROMUCOSAL | Status: AC

## 2023-11-06 MED ORDER — HYDROMORPHONE HCL 1 MG/ML IJ SOLN
0.2500 mg | INTRAMUSCULAR | Status: DC | PRN
  Administered 2023-11-06: 0.5 mg via INTRAVENOUS

## 2023-11-06 MED ORDER — ALBUMIN HUMAN 5 % IV SOLN: INTRAVENOUS | Status: DC | PRN

## 2023-11-06 MED ORDER — OXYCODONE HCL 5 MG/5ML PO SOLN: 5.0000 mg | Freq: Once | ORAL | Status: DC | PRN

## 2023-11-06 MED ORDER — ORAL CARE MOUTH RINSE: 15.0000 mL | Freq: Once | OROMUCOSAL | Status: AC

## 2023-11-06 MED ORDER — HYDROMORPHONE HCL 1 MG/ML IJ SOLN
INTRAMUSCULAR | Status: AC
  Filled 2023-11-06: qty 1

## 2023-11-06 MED ORDER — CEFTRIAXONE SODIUM 500 MG IJ SOLR: 500.0000 mg | Freq: Once | INTRAMUSCULAR | Status: DC

## 2023-11-06 MED ORDER — HEPARIN 6000 UNIT IRRIGATION SOLUTION
Status: AC
  Filled 2023-11-06: qty 500

## 2023-11-06 MED ORDER — SUGAMMADEX SODIUM 200 MG/2ML IV SOLN: INTRAVENOUS | Status: DC | PRN

## 2023-11-06 MED ORDER — HEPARIN SODIUM (PORCINE) 1000 UNIT/ML IJ SOLN
INTRAMUSCULAR | Status: DC | PRN
  Administered 2023-11-06: 3000 [IU] via INTRAVENOUS
  Administered 2023-11-06: 4000 [IU] via INTRAVENOUS
  Administered 2023-11-06: 5000 [IU] via INTRAVENOUS
  Administered 2023-11-06: 2000 [IU] via INTRAVENOUS

## 2023-11-06 MED ORDER — ONDANSETRON HCL 4 MG/2ML IJ SOLN
INTRAMUSCULAR | Status: DC | PRN
  Administered 2023-11-06: 4 mg via INTRAVENOUS

## 2023-11-06 MED ORDER — HEPARIN (PORCINE) 25000 UT/250ML-% IV SOLN
INTRAVENOUS | Status: AC
  Filled 2023-11-06: qty 250

## 2023-11-06 MED ORDER — OXYCODONE HCL 5 MG PO TABS: 5.0000 mg | ORAL_TABLET | Freq: Once | ORAL | Status: DC | PRN

## 2023-11-06 MED ORDER — INSULIN ASPART 100 UNIT/ML IJ SOLN
0.0000 [IU] | Freq: Three times a day (TID) | INTRAMUSCULAR | Status: DC
  Administered 2023-11-07 – 2023-11-14 (×8): 1 [IU] via SUBCUTANEOUS

## 2023-11-06 MED ORDER — HEPARIN SODIUM (PORCINE) 1000 UNIT/ML IJ SOLN
INTRAMUSCULAR | Status: AC
  Filled 2023-11-06: qty 10

## 2023-11-06 MED ORDER — PENICILLIN G BENZATHINE 1200000 UNIT/2ML IM SUSY: 2.4000 10*6.[IU] | PREFILLED_SYRINGE | Freq: Once | INTRAMUSCULAR | Status: DC

## 2023-11-06 MED ORDER — SUGAMMADEX SODIUM 200 MG/2ML IV SOLN
INTRAVENOUS | Status: DC | PRN
  Administered 2023-11-06: 145.2 mg via INTRAVENOUS

## 2023-11-06 MED ORDER — DEXAMETHASONE SODIUM PHOSPHATE 10 MG/ML IJ SOLN
INTRAMUSCULAR | Status: DC | PRN
  Administered 2023-11-06: 6 mg via INTRAVENOUS

## 2023-11-06 MED ORDER — CEFAZOLIN SODIUM-DEXTROSE 2-3 GM-%(50ML) IV SOLR
INTRAVENOUS | Status: DC | PRN
  Administered 2023-11-06: 2 g via INTRAVENOUS

## 2023-11-06 MED ORDER — PHENYLEPHRINE HCL-NACL 20-0.9 MG/250ML-% IV SOLN
INTRAVENOUS | Status: DC | PRN
  Administered 2023-11-06: 50 ug/min via INTRAVENOUS

## 2023-11-06 MED ORDER — ACETAMINOPHEN 10 MG/ML IV SOLN
INTRAVENOUS | Status: AC
  Filled 2023-11-06: qty 100

## 2023-11-06 MED ORDER — HEPARIN 6000 UNIT IRRIGATION SOLUTION
Status: DC | PRN
  Administered 2023-11-06: 1

## 2023-11-06 MED ORDER — PROPOFOL 10 MG/ML IV BOLUS
INTRAVENOUS | Status: DC | PRN
  Administered 2023-11-06: 110 mg via INTRAVENOUS

## 2023-11-06 MED ORDER — PHENYLEPHRINE 80 MCG/ML (10ML) SYRINGE FOR IV PUSH (FOR BLOOD PRESSURE SUPPORT)
PREFILLED_SYRINGE | INTRAVENOUS | Status: DC | PRN
  Administered 2023-11-06 (×3): 160 ug via INTRAVENOUS
  Administered 2023-11-06: 80 ug via INTRAVENOUS
  Administered 2023-11-06: 120 ug via INTRAVENOUS

## 2023-11-06 MED ORDER — DILTIAZEM HCL-DEXTROSE 125-5 MG/125ML-% IV SOLN (PREMIX)
5.0000 mg/h | INTRAVENOUS | Status: AC
Start: 2023-11-06 — End: 2023-11-08
  Administered 2023-11-06: 5 mg/h via INTRAVENOUS
  Administered 2023-11-07: 2.5 mg/h via INTRAVENOUS
  Administered 2023-11-07: 5 mg/h via INTRAVENOUS
  Administered 2023-11-07: 7.5 mg/h via INTRAVENOUS
  Filled 2023-11-06 (×2): qty 125

## 2023-11-06 SURGICAL SUPPLY — 50 items
BAG COUNTER SPONGE SURGICOUNT (BAG) ×3 IMPLANT
BAG ISOLATION DRAPE 18X18 (DRAPES) IMPLANT
CANISTER SUCT 3000ML PPV (MISCELLANEOUS) ×3 IMPLANT
CATH EMB 3FR 40 (CATHETERS) IMPLANT
CATH EMB 4FR 80 (CATHETERS) IMPLANT
CLIP TI MEDIUM 24 (CLIP) ×3 IMPLANT
CLIP TI WIDE RED SMALL 24 (CLIP) ×3 IMPLANT
COVER PROBE W GEL 5X96 (DRAPES) ×3 IMPLANT
DERMABOND ADVANCED .7 DNX6 (GAUZE/BANDAGES/DRESSINGS) IMPLANT
DRAIN CHANNEL 15F RND FF W/TCR (WOUND CARE) IMPLANT
DRAPE C-ARM 42X72 X-RAY (DRAPES) ×3 IMPLANT
ELECT REM PT RETURN 9FT ADLT (ELECTROSURGICAL) ×3
ELECTRODE REM PT RTRN 9FT ADLT (ELECTROSURGICAL) ×3 IMPLANT
EVACUATOR SILICONE 100CC (DRAIN) IMPLANT
GAUZE PAD ABD 8X10 STRL (GAUZE/BANDAGES/DRESSINGS) IMPLANT
GAUZE SPONGE 4X4 12PLY STRL (GAUZE/BANDAGES/DRESSINGS) IMPLANT
GLOVE BIO SURGEON STRL SZ 6.5 (GLOVE) IMPLANT
GLOVE BIO SURGEON STRL SZ7.5 (GLOVE) ×3 IMPLANT
GLOVE BIOGEL PI IND STRL 8 (GLOVE) ×3 IMPLANT
GOWN STRL REUS W/ TWL LRG LVL3 (GOWN DISPOSABLE) ×6 IMPLANT
GOWN STRL REUS W/ TWL XL LVL3 (GOWN DISPOSABLE) ×6 IMPLANT
HEMOSTAT SPONGE AVITENE ULTRA (HEMOSTASIS) IMPLANT
INSERT FOGARTY SM (MISCELLANEOUS) IMPLANT
KIT BASIN OR (CUSTOM PROCEDURE TRAY) ×3 IMPLANT
KIT TURNOVER KIT B (KITS) ×3 IMPLANT
NS IRRIG 1000ML POUR BTL (IV SOLUTION) ×6 IMPLANT
PACK PERIPHERAL VASCULAR (CUSTOM PROCEDURE TRAY) ×3 IMPLANT
PAD ARMBOARD 7.5X6 YLW CONV (MISCELLANEOUS) ×6 IMPLANT
PATCH VASC XENOSURE 1X6 (Vascular Products) IMPLANT
SPONGE T-LAP 18X18 ~~LOC~~+RFID (SPONGE) IMPLANT
STAPLER SKIN 35 WIDE (STAPLE) IMPLANT
STAPLER VISISTAT 35W (STAPLE) IMPLANT
STOPCOCK 4 WAY LG BORE MALE ST (IV SETS) IMPLANT
SUT ETHILON 3 0 PS 1 (SUTURE) IMPLANT
SUT GORETEX 5 0 TT13 24 (SUTURE) IMPLANT
SUT GORETEX 6.0 TT13 (SUTURE) IMPLANT
SUT MNCRL AB 4-0 PS2 18 (SUTURE) ×9 IMPLANT
SUT PROLENE 5 0 C 1 24 (SUTURE) ×3 IMPLANT
SUT PROLENE 6 0 BV (SUTURE) ×3 IMPLANT
SUT PROLENE 7 0 BV 1 (SUTURE) IMPLANT
SUT SILK 2 0 PERMA HAND 18 BK (SUTURE) IMPLANT
SUT SILK 3-0 18XBRD TIE 12 (SUTURE) IMPLANT
SUT VIC AB 2-0 CT1 TAPERPNT 27 (SUTURE) ×6 IMPLANT
SUT VIC AB 3-0 SH 27X BRD (SUTURE) ×9 IMPLANT
TAPE CLOTH SURG 4X10 WHT LF (GAUZE/BANDAGES/DRESSINGS) IMPLANT
TOWEL GREEN STERILE (TOWEL DISPOSABLE) ×3 IMPLANT
TRAY FOLEY MTR SLVR 16FR STAT (SET/KITS/TRAYS/PACK) ×3 IMPLANT
TUBING EXTENTION W/L.L. (IV SETS) IMPLANT
UNDERPAD 30X36 HEAVY ABSORB (UNDERPADS AND DIAPERS) ×3 IMPLANT
WATER STERILE IRR 1000ML POUR (IV SOLUTION) ×3 IMPLANT

## 2023-11-06 NOTE — Consult Note (Addendum)
[facility] [place]  ASSESSMENT / PLAN: 78 y.o. male with subacute right lower extremity ischemia for the past 3 weeks. He has sensation in the foot, but he has tenderness in the calf and he has limited motor function in the foot. We will plan to proceed to the OR urgently for right femoral thromboendarterectomy and calf fasciotomies.   CHIEF COMPLAINT: Right leg pain for 3 weeks  HISTORY OF PRESENT ILLNESS: Manuel Winters is a 78 y.o. male who presents to [facility], ER as a transfer from [facility] for right lower extremity pain.  A CT angiogram performed at [facility] showed bilateral common femoral artery occlusions as well as a right superficial femoral artery occlusion.  There is poor opacification of the arterial tree from the popliteal artery distally in the right leg.  There is no Doppler flow in the [facility], ER on the ER provider's exam.  The case was discussed with me, and the patient was found to have sensation and motor function in the foot.  I recommended transfer to [facility] and initiation of a heparin drip.  On evaluation, the patient has limited ability to dorsi and plantarflex the toes.  He has significant pain in his foot.  He reports sensation over the foot to me.  I counseled him about the limb threatening nature of these findings.  I counseled him we should go to the operating room urgently.  Past Medical History:  Diagnosis Date   Hypertension    History reviewed. No pertinent surgical history.  History reviewed. No pertinent family history.  Social History   Socioeconomic History   Marital status: Married    Spouse name: Not on file   Number of children: Not on file   Years of education: Not on file   Highest education level: Not on file  Occupational History   Not on file  Tobacco Use   Smoking status: Never   Smokeless tobacco: Never  Vaping Use   Vaping status: Never Used  Substance and Sexual  Activity   Alcohol use: Yes    Comment: occasonal   Drug use: Never   Sexual activity: Not Currently  Other Topics Concern   Not on file  Social History Narrative   Not on file   Social Drivers of Health   Financial Resource Strain: Not on file  Food Insecurity: No Food Insecurity (06/09/2023)   Received from [facility]   Hunger Vital Sign    Worried About Running Out of Food in the Last Year: Never true    Ran Out of Food in the Last Year: Never true  Transportation Needs: Not on file  Physical Activity: Not on file  Stress: Not on file  Social Connections: Not on file  Intimate Partner Violence: Not on file    No Known Allergies  Current Facility-Administered Medications  Medication Dose Route Frequency Provider Last Rate Last Admin   0.9 %  sodium chloride infusion   Intravenous Continuous Dolly Rias, MD 100 mL/hr at 11/06/23 0012 New Bag at 11/06/23 0012   acetaminophen (TYLENOL) tablet 1,000 mg  1,000 mg Oral Q6H PRN Segars, Christiane Ha, MD       diltiazem (CARDIZEM) tablet 60 mg  60 mg Oral Q6H Segars, Christiane Ha, MD   60 mg at 11/05/23 2328   heparin ADULT infusion 100 units/mL (25000 units/288mL)  1,200 Units/hr Intravenous Continuous Arabella Merles, RPH 12 mL/hr at 11/06/23 0651 1,200 Units/hr at 11/06/23  5176   HYDROmorphone (DILAUDID) injection 0.5 mg  0.5 mg Intravenous Q4H PRN Dolly Rias, MD   0.5 mg at 11/06/23 0231   insulin aspart (novoLOG) injection 0-6 Units  0-6 Units Subcutaneous TID WC Segars, Christiane Ha, MD       latanoprost (XALATAN) 0.005 % ophthalmic solution 1 drop  1 drop Both Eyes QHS Dolly Rias, MD   1 drop at 11/05/23 2336   melatonin tablet 6 mg  6 mg Oral QHS PRN Dolly Rias, MD       ondansetron [facility]) injection 4 mg  4 mg Intravenous Q6H PRN Dolly Rias, MD       oxyCODONE (Oxy IR/ROXICODONE) immediate release tablet 2.5 mg  2.5 mg Oral Q4H PRN Dolly Rias, MD       Or   oxyCODONE (Oxy IR/ROXICODONE) immediate  release tablet 5 mg  5 mg Oral Q4H PRN Dolly Rias, MD   5 mg at 11/06/23 0123   pantoprazole (PROTONIX) EC tablet 40 mg  40 mg Oral Daily Segars, Christiane Ha, MD       polyethylene glycol (MIRALAX / GLYCOLAX) packet 17 g  17 g Oral Daily PRN Segars, Christiane Ha, MD       rosuvastatin (CRESTOR) tablet 10 mg  10 mg Oral QHS Dolly Rias, MD   10 mg at 11/05/23 2328   sodium chloride flush (NS) 0.9 % injection 3 mL  3 mL Intravenous Q12H Dolly Rias, MD   3 mL at 11/05/23 2219   Current Outpatient Medications  Medication Sig Dispense Refill   chlorthalidone (HYGROTON) 25 MG tablet Take 25 mg by mouth every morning.     cholecalciferol (VITAMIN D3) 25 MCG (1000 UNIT) tablet Take 1,000 Units by mouth daily.     felodipine (PLENDIL) 10 MG 24 hr tablet Take 10 mg by mouth daily.     latanoprost (XALATAN) 0.005 % ophthalmic solution Place 1 drop into both eyes at bedtime.     losartan (COZAAR) 100 MG tablet Take 100 mg by mouth daily.     methylPREDNISolone (MEDROL DOSEPAK) 4 MG TBPK tablet Take 4 mg by mouth as directed.     omeprazole (PRILOSEC) 40 MG capsule Take 40 mg by mouth daily.     rosuvastatin (CRESTOR) 10 MG tablet Take 10 mg by mouth at bedtime.     traMADol (ULTRAM) 50 MG tablet Take 50 mg by mouth every 6 (six) hours as needed for moderate pain (pain score 4-6).     tiZANidine (ZANAFLEX) 4 MG tablet Take 4 mg by mouth 3 (three) times daily.      PHYSICAL EXAM Vitals:   11/06/23 0330 11/06/23 0415 11/06/23 0430 11/06/23 0543  BP: (!) 143/120 (!) 151/93 (!) 142/75 (!) 145/92  Pulse: 99 84 76 89  Resp: (!) 22 15 20 18   Temp:    97.8 F (36.6 C)  TempSrc:    Oral  SpO2: 96% 100% 100% 100%  Weight:      Height:       Elderly man in no acute distress Regular rate and rhythm Unlabored breathing Doppler flow in left dorsalis pedis artery  No Doppler flow in the right lower extremity Patient able to feel light touch over the foot Limited motor function in the right toes  and ankle Tender right calf  PERTINENT LABORATORY AND RADIOLOGIC DATA  Most recent CBC    Latest Ref Rng & Units 11/06/2023    4:11 AM 11/05/2023    5:48 PM 10/05/2021    1:32  PM  CBC  WBC 4.0 - 10.5 K/uL 9.1  10.2  9.8   Hemoglobin 13.0 - 17.0 g/dL 74.9  35.5  21.7   Hematocrit 39.0 - 52.0 % 35.6  39.5  36.1   Platelets 150 - 400 K/uL 229  244  230      Most recent CMP    Latest Ref Rng & Units 11/06/2023    4:11 AM 11/05/2023    9:35 PM 11/05/2023    6:31 PM  CMP  Glucose 70 - 99 mg/dL 471   595   BUN 8 - 23 mg/dL 35   43   Creatinine 3.96 - 1.24 mg/dL 7.28   9.79   Sodium 150 - 145 mmol/L 135   134   Potassium 3.5 - 5.1 mmol/L 4.0   3.9   Chloride 98 - 111 mmol/L 108   102   CO2 22 - 32 mmol/L 19   18   Calcium 8.9 - 10.3 mg/dL 9.5   41.3   Total Protein 6.5 - 8.1 g/dL  8.3    Total Bilirubin 0.0 - 1.2 mg/dL  0.6    Alkaline Phos 38 - 126 U/L  60    AST 15 - 41 U/L  114    ALT 0 - 44 U/L  19      Renal function Estimated Creatinine Clearance: 37 mL/min (A) (by C-G formula based on SCr of 1.59 mg/dL (H)).  No results found for: "HGBA1C"  LDL Cholesterol  Date Value Ref Range Status  11/06/2023 40 0 - 99 mg/dL Final    Comment:           Total Cholesterol/HDL:CHD Risk Coronary Heart Disease Risk Table                     Men   Women  1/2 Average Risk   3.4   3.3  Average Risk       5.0   4.4  2 X Average Risk   9.6   7.1  3 X Average Risk  23.4   11.0        Use the calculated Patient Ratio above and the CHD Risk Table to determine the patient's CHD Risk.        ATP III CLASSIFICATION (LDL):  <100     mg/dL   Optimal  643-837  mg/dL   Near or Above                    Optimal  130-159  mg/dL   Borderline  793-968  mg/dL   High  >864     mg/dL   Very High Performed at [facility], [address], [place], [place] [address]     CT angiogram shows bilateral common femoral artery occlusions.  Right superficial femoral artery is occluded.  There is no  flow in the popliteal artery and distally in the right lower extremity.  Rande Brunt. Lenell Antu, MD FACS [facility] Phone Number: [phone] 11/06/2023 7:14 AM   Total time spent on preparing this encounter including chart review, data review, collecting history, examining the patient, coordinating care for this new patient, 60 minutes.  Portions of this report may have been transcribed using voice recognition software.  Every effort has been made to ensure accuracy; however, inadvertent computerized transcription errors may still be present.

## 2023-11-06 NOTE — ED Notes (Signed)
Patient placed on hospital bed for comfort °

## 2023-11-06 NOTE — Progress Notes (Signed)
*  PRELIMINARY RESULTS* Echocardiogram 2D Echocardiogram has been performed.  Manuel Winters 11/06/2023, 3:23 PM

## 2023-11-06 NOTE — Op Note (Signed)
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  Right common femoral endarterectomy with bovine patch angioplasty 2.  Right lower extremity thrombectomy from a right common femoral approach 3.  Right popliteal cutdown with tibial thrombectomy including the anterior tibial, posterior tibial and peroneal artery 4.  Right lower extremity 4 compartment fasciotomies  Surgeon: Dr. Cephus Shelling, MD  Assistant: Dr. Elna Breslow, MD  Indications: 78 year old male transferred from [facility] with 2 weeks of right lower pain.  CTA showed occluded right common femoral artery with occlusion in the proximal SFA.  There is also no flow distal to the popliteal vessels.  The tibials also appeared occluded.  He presents for thrombectomy after risks benefits discussed.  An assistant was needed given the complexity of the case and also for multiple thrombectomies and fasciotomies.  Findings: Transverse incision in the right groin.  Dissected out the common femoral artery as well as the SFA and profunda.  This was chronically diseased and had subacute to chronic appearing thrombus.  I was able to pass a Fogarty down the SFA all the way into the foot and got multiple plugs of acute appearing thrombus.  The disease in the common femoral was then endarterectomized onto the proximal SFA  A bovine patch was sewn.  We then performed 4 compartment fasciotomies in the right lower extremity.  The deep posterior compartment and the lateral compartment muscle looked questionable.  We lost a signal in the foot and then cut down on the below-knee popliteal artery performed individual thrombectomies of the anterior tibial, peroneal, and posterior tibial artery.  Brisk PT signal at completion.  Anesthesia: General  Details: Patient was taken to the operating room after informed consent was obtained.  Placed on operative  table in the supine position.  General endotracheal anesthesia was induced.  The right groin and right leg were prepped and draped standard sterile fashion.  Antibiotics were given and timeout performed.  I made a transverse incision in the right groin dissected down through the subcutaneous tissue and then dissected out the common femoral artery as well as the SFA and profunda as well as the distal external iliac placed Vesseloops.  Common femoral artery was calcified and diseased.  We gave additional 4,000 units IV heparin checked ACT to maintain greater than 250.  I then pulled up on the Vesseloops on the SFA profunda and used a Henley clamp on the distal external iliac artery.  We made a longitudinal arteriotomy in the right common femoral artery with 11 blade scalpel Potts scissors.  There was significant calcified disease with subacute to chronic appearing thrombus.  Distally I was able to get a #4 Fogarty in the SFA to go all the way into the foot and got multiple plugs of acute appearing thrombus.  We had good backbleeding from the SFA and profunda.  I then used a Runner, broadcasting/film/video and performed extensive endarterectomy of the common femoral all the way down to the proximal SFA with eversion endarterectomy the profunda.  A bovine patch was then sewn on this arteriotomy and we de-aired this prior to completion with 5-0 Prolene parachute technique with the help of Dr. Lenell Antu.  We then turned our attention to the right calf where we made 4 compartment fasciotomies using a longitudinal medial and lateral incision on the right calf.  We initially open the posterior compartments including taking down the soleus to get in the  deep compartment.  I then opened the anterior and lateral compartments with incision and identified the septum to ensure both fascial compartments were open.  The muscle appeared marginal in the deep posterior and lateral compartments.  We lost the signal in the right PT at this time so Dr.  Lenell Antu cut down on the below-knee popliteal artery and controlled this with Vesseloops.  A transverse arteriotomy was made and then we got out the anterior tibial as well as the TP trunk including the posterior tibial and peroneal arteries.  Vesseloops were used to control these vessels.  We then passed #3 Fogarty down the anterior tibial as well as the posterior tibial and peroneal and chronic appearing thrombus was retrieved from all vessels.  We had backbleeding from all the vessels.  The arteriotomy was closed with 6-0 Prolene.  Patient now has a brisk PT signal.  Bilateral fasciotomies were washed out and closed the lateral calf incision with staples the medial fasciotomy was partially closed with staples.  The leg was wrapped with a surgical dressing.  The right groin was then irrigated out and we had good hemostasis in the patch.  This was closed in multiple layers of 2-0 Vicryl, 3-0 Vicryl, 4-0 Monocryl and Dermabond.  Complication: None  Condition: Stable  Cephus Shelling, MD [facility] [place] Office: [phone]   Cephus Shelling

## 2023-11-06 NOTE — Anesthesia Preprocedure Evaluation (Addendum)
Anesthesia Evaluation  Patient identified by MRN, date of birth, ID band Patient awake    Reviewed: Allergy & Precautions, NPO status , Patient's Chart, lab work & pertinent test results  Airway Mallampati: I  TM Distance: >3 FB Neck ROM: Full    Dental no notable dental hx. (+) Edentulous Upper, Edentulous Lower   Pulmonary    Pulmonary exam normal breath sounds clear to auscultation       Cardiovascular Exercise Tolerance: Good hypertension, Pt. on medications + Peripheral Vascular Disease (R Popliteal occlusion)  Normal cardiovascular exam+ dysrhythmias (now on diltiazem and heparin) Atrial Fibrillation  Rhythm:Regular Rate:Normal  Echo rpt png   Neuro/Psych    GI/Hepatic ,GERD  Medicated and Controlled,,  Endo/Other  diabetes, Type 2    Renal/GU Renal InsufficiencyRenal diseaseLab Results      Component                Value               Date                       K                        4.0                 11/06/2023                CO2                      19 (L)              11/06/2023                BUN                      35 (H)              11/06/2023                CREATININE               1.59 (H)            11/06/2023                GFRNONAA                 44 (L)              11/06/2023                    GLUCOSE                  134 (H)             11/06/2023                Musculoskeletal   Abdominal   Peds  Hematology On Heprin Lab Results      Component                Value               Date                      WBC                      9.1  11/06/2023                HGB                      11.9 (L)            11/06/2023                HCT                      35.6 (L)            11/06/2023                MCV                      88.3                11/06/2023                PLT                      229                 11/06/2023              Anesthesia Other Findings    Reproductive/Obstetrics                              Anesthesia Physical Anesthesia Plan  ASA: 3 and emergent  Anesthesia Plan: General   Post-op Pain Management:    Induction: Intravenous  PONV Risk Score and Plan: 3 and Treatment may vary due to age or medical condition and Ondansetron  Airway Management Planned: Oral ETT  Additional Equipment: Arterial line  Intra-op Plan:   Post-operative Plan: Extubation in OR  Informed Consent: I have reviewed the patients History and Physical, chart, labs and discussed the procedure including the risks, benefits and alternatives for the proposed anesthesia with the patient or authorized representative who has indicated his/her understanding and acceptance.     Dental advisory given  Plan Discussed with: Anesthesiologist and CRNA  Anesthesia Plan Comments:          Anesthesia Quick Evaluation

## 2023-11-06 NOTE — Anesthesia Procedure Notes (Signed)
Procedure Name: Intubation Date/Time: 11/06/2023 10:15 AM  Performed by: Marena Chancy, CRNAPre-anesthesia Checklist: Patient identified, Emergency Drugs available, Suction available and Patient being monitored Patient Re-evaluated:Patient Re-evaluated prior to induction Oxygen Delivery Method: Circle System Utilized Preoxygenation: Pre-oxygenation with 100% oxygen Induction Type: IV induction Ventilation: Mask ventilation without difficulty Laryngoscope Size: Mac and 4 Grade View: Grade II Tube type: Oral Tube size: 8.0 mm Number of attempts: 1 Airway Equipment and Method: Stylet and Oral airway Placement Confirmation: ETT inserted through vocal cords under direct vision, positive ETCO2 and breath sounds checked- equal and bilateral Tube secured with: Tape Dental Injury: Teeth and Oropharynx as per pre-operative assessment

## 2023-11-06 NOTE — Transfer of Care (Signed)
Immediate Anesthesia Transfer of Care Note  Patient: Manuel Winters  Procedure(s) Performed: RIGHT THROMBECTOMY FEMORAL ARTERY (Right) FASCIOTOMY (Right: Leg Upper) ENDARTERECTOMY RIGHT COMMON FEMORAL ARTERY WITH BOVINE PATCH POPLITEAL  CUTDOWN WITH RIGHT TIBIAL THROMBECTOMY  Patient Location: PACU  Anesthesia Type:General  Level of Consciousness: awake, alert , and oriented  Airway & Oxygen Therapy: Patient Spontanous Breathing and Patient connected to nasal cannula oxygen  Post-op Assessment: Report given to RN, Post -op Vital signs reviewed and stable, and Patient moving all extremities X 4  Post vital signs: Reviewed and stable  Last Vitals:  Vitals Value Taken Time  BP 139/73 11/06/23 1245  Temp 36.6 C 11/06/23 1245  Pulse 99 11/06/23 1252  Resp 20 11/06/23 1252  SpO2 100 % 11/06/23 1252  Vitals shown include unfiled device data.  Last Pain:  Vitals:   11/06/23 0921  TempSrc:   PainSc: 0-No pain         Complications: No notable events documented.

## 2023-11-06 NOTE — ED Provider Notes (Signed)
  Provider Note MRN:  [id number]  Arrival date & time: 11/06/23    ED Course and Medical Decision Making  Assumed care of patient at sign-out or upon transfer.  Transfer from [facility] emergency department, concern for arterial insufficiency of the right foot.  Full popliteal occlusion seen on CT imaging.  On heparin drip.  Was admitted to the hospitalist service however with transfer wait times it was determined that patient should be transferred to the emergency department to receive more prompt evaluation by vascular surgery.  On my assessment patient is well-appearing.  His right foot is cold and has significant decreased dexterity compared to the left foot.  However he explains that his foot has been like this for 2 or 3 weeks.  I reached out to Dr. Ivette Loyal and a vascular surgery to discuss the case and the CT imaging performed at [facility], he will come to evaluate the patient this morning however given the duration of symptoms patient does not need emergent rapid transfer to the OR at this time.  .Critical Care  Performed by: Sabas Sous, MD Authorized by: Sabas Sous, MD   Critical care provider statement:    Critical care time (minutes):  32   Critical care was necessary to treat or prevent imminent or life-threatening deterioration of the following conditions: Limb ischemia.   Critical care was time spent personally by me on the following activities:  Development of treatment plan with patient or surrogate, discussions with consultants, evaluation of patient's response to treatment, examination of patient, ordering and review of laboratory studies, ordering and review of radiographic studies, ordering and performing treatments and interventions, pulse oximetry, re-evaluation of patient's condition and review of old charts   Final Clinical Impressions(s) / ED Diagnoses     ICD-10-CM   1. Arterial occlusion, lower extremity (HCC)  I70.209     2. Atrial fibrillation, unspecified  type [facility])  I48.91       ED Discharge Orders     None       Discharge Instructions   None     Elmer Sow. Pilar Plate, MD [facility] [email or web address]    Sabas Sous, MD 11/06/23 [phone]

## 2023-11-06 NOTE — Progress Notes (Signed)
PHARMACY - ANTICOAGULATION CONSULT NOTE  Pharmacy Consult for Heparin Infusion Indication:  arterial occlusion  No Known Allergies  Patient Measurements: Height: 5\' 8"  (172.7 cm) Weight: 73.4 kg (161 lb 13.1 oz) IBW/kg (Calculated) : 68.4 Heparin Dosing Weight: 73.4 kg  Vital Signs: Temp: 98.1 F (36.7 C) (01/17 0130) Temp Source: Oral (01/17 0130) BP: 142/75 (01/17 0430) Pulse Rate: 76 (01/17 0430)  Labs: Recent Labs    11/05/23 1748 11/05/23 1831 11/05/23 1834 11/05/23 2020 11/05/23 2202 11/05/23 2355 11/06/23 0411  HGB 13.3  --   --   --   --   --  11.9*  HCT 39.5  --   --   --   --   --  35.6*  PLT 244  --   --   --   --   --  229  APTT  --   --   --   --   --  113*  --   LABPROT  --   --   --   --  14.2  --  14.5  INR  --   --   --   --  1.1  --  1.1  HEPARINUNFRC  --   --   --   --   --   --  0.78*  CREATININE  --  2.19*  --   --   --   --  1.59*  CKTOTAL  --   --   --   --   --  5,733*  --   TROPONINIHS  --   --  22* 26*  --   --   --     Estimated Creatinine Clearance: 37 mL/min (A) (by C-G formula based on SCr of 1.59 mg/dL (H)).   Medical History: Past Medical History:  Diagnosis Date   Hypertension    Assessment: Patient is a 78yo male presenting with severe right leg pain. CT angiogram shows arterial occlusion. Reviewed home meds and no anticoagulants noted.Pharmacy consulted for Heparin dosing.  1/17 AM: heparin level returned at 0.78 on 1250 units/hr (supra-therapeutic, but drawn ~2h early). Per RN, no issues with the heparin infusion running continuously or signs/symptoms of bleeding. CBC shows Hgb drop from 13.3 > 11.9 and plts 244 > 229.  Goal of Therapy:  Heparin level 0.3-0.7 units/ml Monitor platelets by anticoagulation protocol: Yes   Plan:  Decrease heparin infusion to 1200 units/hr (slight decrease in rate given level drawn early, may still be seeing bolus) Check anti-Xa level in 8 hours and daily while on heparin Continue to  monitor H&H and platelets  Arabella Merles, PharmD. Clinical Pharmacist 11/06/2023 5:23 AM

## 2023-11-06 NOTE — Progress Notes (Signed)
PROGRESS NOTE    Manuel Winters  ZOX:[id number] DOB: 09-01-1946 DOA: 11/05/2023 PCP: Oletha Blend, MD   Brief Narrative:  This 78 yrs old Male with PMH significant for hypertension, diabetes type 2, hyperlipidemia, CKD 3, recent ED and outpatient visits for right leg pain, who presents due to persistent symptoms.  Initially reports that his pain has been ongoing for about 1 month. He is adamant that he had no symptoms of claudication or leg pain prior to a fall that occurred about a month ago.  He was chasing a truck and tripped and fell, reports about a week later developed pain from below the knee down to the foot which is intermittent and severe.  He denies any numbness or weakness in the foot but does have deficits on exam.  Right leg has been cool.  Pain has been progressive especially in the past week, has been trying to ambulate with the use of crutches but in the past few days not able to get up much at all.   Had no known history of PAD or other ASCVD prior, not taking antiplatelets or anticoagulants prior to this admission.  No history of bleeding issues.  He was admitted for further evaluation.  Assessment & Plan:   Principal Problem:   Arterial occlusion, lower extremity (HCC)   Right popliteal artery occlusion suspect subacute: Right foot drop, Severe ischemic injury. PAD with bilateral SFA occlusions: Patient has chronic PAD now presented with worsening right leg symptoms. Etiology of this possibly atherothrombotic /embolic from underlying PAD v less likely cardioembolic with newly identified Aflutter.  RLE below the knee is cold with no pedal pulses on the Right, there is numbness in stocking distribution below the ankle on the R side, and strength is 3/5 with dorsiflexion at the ankle, no tissue loss.  CTA with complete occlusion at R popliteal with no runoff to the foot, additional occlusions R SFA origin + prox SFA with reconstitution, and L CFA with reconstitution and 3  vessel runoff on the left.  Vascular surgery Dr. Lenell Antu recommends for transfer to [facility] and initiation of heparin drip.  Continue rosuvastatin 10 mg, currently maxed for renal function Serial neurovascular checks of the right lower extremity Adequate pain control. Patient is scheduled for right femoral thromboendarterectomy and calf fasciotomies on 11/06/23.  New onset atrial flutter: Heart rate now controlled.  Initially required Cardizem infusion. Continue Cardizem 30 mg every 6 hours. Continue IV heparin. TTE to eval for thrombus given arterial occlusion, will also eval for underlying substrate for afib   Lactic acidosis: Suspect combined hypovolemic and ischemic from arterial occlusion. Status post IV fluid resuscitation.  Trend lactic acid.   Rhabdomyolysis: Isolated elevation in AST, myoglobinuria. CK 5733 Continue IV fluid resuscitation.  Trend CK level   Acute myocardial injury: No history of chest pain.  High-sensitivity troponin 22 -> 26.   EKG with a flutter with no acute ischemic changes. Likely demand event in the setting of arrhythmia, management directed at underlying A-fib above.   AKI on CKD stage IIIa: Baseline creatinine approximately 1.22, elevated to 2.19 on admission.   Suspect mixed prerenal and intrinsic injury possibly from rhabdomyolysis. Continue IV fluids. Trend lactic acid and CK level Hold losartan, chlorthalidone  Mild hypercalcemia: Resolved with IV hydration.   Hypertension: Hold losartan and chlorthalidone in the setting of AKI.  Stop felodipine since starting on diltiazem.  Diabetes type 2: Diet controlled: Check A1c, SSI while inpatient.  HLD: Crestor 10 mg daily  GERD: Sub home PPI  Glaucoma: Continue home latanoprost drops     DVT prophylaxis: IV heparin Code Status: Full code Family Communication: No family at bedside Disposition Plan:     Status is: Inpatient Remains inpatient appropriate because: Admitted for right  popliteal artery occlusion   Consultants:  Vascular Surgery  Procedures:  Antimicrobials:  Anti-infectives (From admission, onward)    Start     Dose/Rate Route Frequency Ordered Stop   11/06/23 0600  penicillin g benzathine (BICILLIN LA) 1200000 UNIT/2ML injection 2.4 Million Units  Status:  Discontinued        2.4 Million Units Intramuscular  Once 11/06/23 0551 11/06/23 0551   11/06/23 0600  cefTRIAXone (ROCEPHIN) injection 500 mg  Status:  Discontinued        500 mg Intramuscular  Once 11/06/23 0551 11/06/23 0551      Subjective: Patient was seen and examined in the PACU.  Patient is status post vascular procedure. Patient reports having a lot of pain in the right leg.  Patient has good pulses in the right foot.  Objective: Vitals:   11/06/23 1240 11/06/23 1245 11/06/23 1300 11/06/23 1315  BP: (!) 158/86 139/73 (!) 141/93 (!) 154/74  Pulse: 94 (!) 101 (!) 121 (!) 118  Resp: 19 19 18 16   Temp:  97.9 F (36.6 C)    TempSrc:      SpO2:  92% 100% 99%  Weight:      Height:        Intake/Output Summary (Last 24 hours) at 11/06/2023 1326 Last data filed at 11/06/2023 1229 Gross per 24 hour  Intake 2341.85 ml  Output 650 ml  Net 1691.85 ml   Filed Weights   11/05/23 1731 11/06/23 0915  Weight: 73.4 kg 72.6 kg    Examination:  General exam: Appears calm and comfortable , deconditioned, not in any acute distress. Respiratory system: Clear to auscultation. Respiratory effort normal.  RR 16 Cardiovascular system: S1 & S2 heard, RRR. No JVD, murmurs, rubs, gallops or clicks.  Gastrointestinal system: Abdomen is nondistended, soft and nontender.  Normal bowel sounds heard. Central nervous system: Alert and oriented x 2. No focal neurological deficits. Extremities: No edema, no cyanosis, good pulses in the right foot. Skin: No rashes, lesions or ulcers Psychiatry: Judgement and insight appear normal. Mood & affect appropriate.     Data Reviewed: I have personally  reviewed following labs and imaging studies  CBC: Recent Labs  Lab 11/05/23 1748 11/06/23 0411 11/06/23 1207  WBC 10.2 9.1  --   NEUTROABS 8.0*  --   --   HGB 13.3 11.9* 8.8*  HCT 39.5 35.6* 26.0*  MCV 87.6 88.3  --   PLT 244 229  --    Basic Metabolic Panel: Recent Labs  Lab 11/05/23 1831 11/05/23 1834 11/06/23 0411 11/06/23 1207  NA 134*  --  135 140  K 3.9  --  4.0 4.0  CL 102  --  108  --   CO2 18*  --  19*  --   GLUCOSE 151*  --  134*  --   BUN 43*  --  35*  --   CREATININE 2.19*  --  1.59*  --   CALCIUM 10.4*  --  9.5  --   MG  --  1.8 1.8  --   PHOS  --   --  3.1  --    GFR: Estimated Creatinine Clearance: 37 mL/min (A) (by C-G formula based on SCr of 1.59 mg/dL (  H)). Liver Function Tests: Recent Labs  Lab 11/05/23 2135  AST 114*  ALT 19  ALKPHOS 60  BILITOT 0.6  PROT 8.3*  ALBUMIN 3.7   No results for input(s): "LIPASE", "AMYLASE" in the last 168 hours. No results for input(s): "AMMONIA" in the last 168 hours. Coagulation Profile: Recent Labs  Lab 11/05/23 2202 11/06/23 0411  INR 1.1 1.1   Cardiac Enzymes: Recent Labs  Lab 11/05/23 2355  CKTOTAL 5,733*   BNP (last 3 results) No results for input(s): "PROBNP" in the last 8760 hours. HbA1C: Recent Labs    11/05/23 1748  HGBA1C 7.2*   CBG: Recent Labs  Lab 11/06/23 0804 11/06/23 1259  GLUCAP 119* 112*   Lipid Profile: Recent Labs    11/06/23 0411  CHOL 90  HDL 40*  LDLCALC 40  TRIG 50  CHOLHDL 2.3   Thyroid Function Tests: Recent Labs    11/06/23 0411  TSH 1.150   Anemia Panel: No results for input(s): "VITAMINB12", "FOLATE", "FERRITIN", "TIBC", "IRON", "RETICCTPCT" in the last 72 hours. Sepsis Labs: Recent Labs  Lab 11/05/23 1952 11/05/23 2135  LATICACIDVEN 1.9 2.1*    Recent Results (from the past 240 hours)  Surgical pcr screen     Status: Abnormal   Collection Time: 11/06/23  8:44 AM   Specimen: Nasal Mucosa; Nasal Swab  Result Value Ref Range Status    MRSA, PCR NEGATIVE NEGATIVE Final   Staphylococcus aureus POSITIVE (A) NEGATIVE Final    Comment: (NOTE) The Xpert SA Assay (FDA approved for NASAL specimens in patients 63 years of age and older), is one component of a comprehensive surveillance program. It is not intended to diagnose infection nor to guide or monitor treatment. Performed at [facility], [address], [place], [place] [address]     Radiology Studies: DG Chest Port 1 View Result Date: 11/05/2023 CLINICAL DATA:  AFib. EXAM: PORTABLE CHEST 1 VIEW COMPARISON:  08/11/2005. FINDINGS: The heart size and mediastinal contours are within normal limits. There is atherosclerotic calcification of the aorta. No consolidation, effusion, or pneumothorax is seen. No acute osseous abnormality. IMPRESSION: No active disease. Electronically Signed   By: Thornell Sartorius M.D.   On: 11/05/2023 21:58   CT Angio Aortobifemoral W and/or Wo Contrast Result Date: 11/05/2023 CLINICAL DATA:  Claudication or leg ischemia Pt reports he has been having pain in his right foot for about 3 weeks and has had a negative [place] and xray and was given an MRI yesterday and was told a nerve was pressing on something EXAM: CT ANGIOGRAPHY OF ABDOMINAL AORTA WITH ILIOFEMORAL RUNOFF TECHNIQUE: Multidetector CT imaging of the abdomen, pelvis and lower extremities was performed using the standard protocol during bolus administration of intravenous contrast. Multiplanar CT image reconstructions and MIPs were obtained to evaluate the vascular anatomy. RADIATION DOSE REDUCTION: This exam was performed according to the departmental dose-optimization program which includes automated exposure control, adjustment of the mA and/or kV according to patient size and/or use of iterative reconstruction technique. CONTRAST:  OMNIPAQUE IOHEXOL 350 MG/ML SOLN COMPARISON:  None Available. FINDINGS: VASCULAR Aorta: Severe atherosclerotic plaque. Normal caliber aorta without aneurysm,  dissection, vasculitis or significant stenosis. Celiac: Patent without evidence of aneurysm, dissection, vasculitis or significant stenosis. SMA: Patent without evidence of aneurysm, dissection, vasculitis or significant stenosis. Renals: Both renal arteries are patent without evidence of aneurysm, dissection, vasculitis, fibromuscular dysplasia or significant stenosis. IMA: Patent without evidence of aneurysm, dissection, vasculitis or significant stenosis. RIGHT Lower Extremity Severe atherosclerotic  plaque. Short segment severe narrowing of the left external iliac artery (4:103). 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 A 2 cm almost complete occlusion of the origin of the RIGHT superficial femoral artery. Re-opacification distally. A 4 cm almost complete occlusion of the RIGHT proximal superficial femoral artery. Reopacification distally. LEFT Lower Extremity Severe atherosclerotic plaque. A 3 cm almost complete occlusion of the LEFT common femoral artery. Re-opacification distally with three-vessel runoff. VEINS: No obvious venous abnormality within the limitations of this arterial phase study. Review of the MIP images confirms the above findings. NON-VASCULAR Lower chest: No acute abnormality. Hepatobiliary: No focal liver abnormality. No gallstones, gallbladder wall thickening, or pericholecystic fluid. No biliary dilatation. Pancreas: No focal lesion. Normal pancreatic contour. No surrounding inflammatory changes. No main pancreatic ductal dilatation. Spleen: Normal in size without focal abnormality. Adrenals/Urinary Tract: No adrenal nodule bilaterally. Bilateral kidneys enhance symmetrically. Fluid density lesions of the left kidney likely represent simple renal cysts. Simple renal cysts, in the absence of clinically indicated signs/symptoms, require no independent follow-up. No hydronephrosis. No hydroureter. The urinary bladder is unremarkable.  Stomach/Bowel: Stomach is within normal limits. No evidence of bowel wall thickening or dilatation. Appendix appears normal. Lymphatic: No lymphadenopathy. Reproductive: Prostate is unremarkable. Other: No intraperitoneal free fluid. No intraperitoneal free gas. No organized fluid collection. Musculoskeletal: No abdominal wall hernia or abnormality. No suspicious lytic or blastic osseous lesions. No acute displaced fracture. Multilevel degenerative changes of the spine. No evidence of fracture, dislocation, or joint effusion of either lower extremity. No evidence of severe arthropathy and no aggressive appearing focal bone abnormality a either lower extremities. Soft tissues are unremarkable. IMPRESSION: VASCULAR 1.  Aortic Atherosclerosis (ICD10-I70.0)-severe. 2. 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 3. A 2 cm almost complete occlusion of the origin of the RIGHT superficial femoral artery. Re-opacification distally. 4. A 4 cm almost complete occlusion of the RIGHT proximal superficial femoral artery. Reopacification distally. 5. A 3 cm almost complete occlusion of the LEFT common femoral artery. Re-opacification distally with three-vessel runoff. NON-VASCULAR 1. No acute intra-abdominal or intrapelvic abnormality. 2. No acute abnormality of the bilateral lower extremities. These results were called by telephone at the time of interpretation on 11/05/2023 at 9:00 pm to provider Ariel PA, who verbally acknowledged these results. Electronically Signed   By: Tish Frederickson M.D.   On: 11/05/2023 21:07    Scheduled Meds:  [MAR Hold] diltiazem  60 mg Oral Q6H   [MAR Hold] insulin aspart  0-6 Units Subcutaneous TID WC   [MAR Hold] latanoprost  1 drop Both Eyes QHS   [MAR Hold] pantoprazole  40 mg Oral Daily   [MAR Hold] rosuvastatin  10 mg Oral QHS   [MAR Hold] sodium chloride flush  3 mL Intravenous Q12H   Continuous Infusions:  sodium chloride 100  mL/hr at 11/06/23 0012   acetaminophen     diltiazem (CARDIZEM) infusion 5 mg/hr (11/06/23 1314)   heparin 1,200 Units/hr (11/06/23 1132)   lactated ringers 10 mL/hr at 11/06/23 1002     LOS: 1 day    Time spent: 50 mins    Willeen Niece, MD [facility]   If 7PM-7AM, please contact night-coverage

## 2023-11-06 NOTE — ED Notes (Signed)
Oxycodone was pulled from pyxis under the 2.5 mg order but patient was given full 5 mg dose for pain scale of 10/10. 5mg  dose scanned in MAR. Pharmacy notified and witnessed.

## 2023-11-06 NOTE — Anesthesia Postprocedure Evaluation (Signed)
Anesthesia Post Note  Patient: Manuel Winters  Procedure(s) Performed: RIGHT THROMBECTOMY FEMORAL ARTERY (Right) FASCIOTOMY (Right: Leg Upper) ENDARTERECTOMY RIGHT COMMON FEMORAL ARTERY WITH BOVINE PATCH POPLITEAL  CUTDOWN WITH RIGHT TIBIAL THROMBECTOMY     Patient location during evaluation: PACU Anesthesia Type: General Level of consciousness: awake and alert Pain management: pain level controlled Vital Signs Assessment: post-procedure vital signs reviewed and stable Respiratory status: spontaneous breathing, nonlabored ventilation, respiratory function stable and patient connected to nasal cannula oxygen Cardiovascular status: blood pressure returned to baseline and stable Postop Assessment: no apparent nausea or vomiting Anesthetic complications: no   No notable events documented.  Last Vitals:  Vitals:   11/06/23 1330 11/06/23 1345  BP: 135/73 (!) 143/80  Pulse: (!) 101 (!) 103  Resp: 20 20  Temp:    SpO2: 99% 98%    Last Pain:  Vitals:   11/06/23 1315  TempSrc:   PainSc: 0-No pain   Pain Goal:                   Trevor Iha

## 2023-11-06 NOTE — Progress Notes (Signed)
Patient has a dense motor deficit to the right lower extremity consistent with [place] 2B ischemia.  Had several weeks of symptoms.  Will be high risk for limb loss.  Discussed right lower extremity thrombectomy and fasciotomies.  Calf is very tender.  Cephus Shelling, MD [facility] Office: [phone]   Cephus Shelling

## 2023-11-06 NOTE — H&P (Addendum)
History and Physical    HAKI PASSLEY Winters:[id number] DOB: 1946-09-19 DOA: 11/05/2023  PCP: Oletha Blend, MD   Patient coming from: Home   Chief Complaint:  Chief Complaint  Patient presents with   Foot Pain    HPI:  Manuel Winters is a 78 y.o. male with hx of hypertension, diabetes type 2, hyperlipidemia, CKD 3, recent ED and outpatient visits for right leg pain, who presents due to persistent symptoms.  Initially reports that his pain has been ongoing for about 2-1/2 weeks in his right leg, however per chart review appears with seen in the ED on 12/20 for similar complaints.  After discussing this as likely has been about 1 month.  He is adamant that he had no symptoms of claudication or leg pain prior to a fall that occurred about a month ago.  He was chasing a truck and tripped and fell, reports about a week later developed pain from below the knee down to the foot which is intermittent and severe.  He denies any numbness or weakness in the foot but does have deficits on exam.  Right leg has been cool.  Pain has been progressive especially in the past week, has been trying to ambulate with the use of crutches but in the past few days not able to get up much at all.  Previously did not require any DME. no symptoms involving the left lower extremity.  Had no known history of PAD or other ASCVD prior, not taking antiplatelets or anticoagulants prior to this admission.  No history of bleeding issues.  Not able to sense any fast heart rates for palpitations, no known history of arrhythmia in the past.  No history of exertional chest pain or shortness of breath.   Review of Systems:  ROS complete and negative except as marked above   No Known Allergies  Prior to Admission medications   Medication Sig Start Date End Date Taking? Authorizing Provider  chlorthalidone (HYGROTON) 25 MG tablet Take 25 mg by mouth every morning.   Yes [provider]  cholecalciferol (VITAMIN D3) 25 MCG  (1000 UNIT) tablet Take 1,000 Units by mouth daily.   Yes [provider]  felodipine (PLENDIL) 10 MG 24 hr tablet Take 10 mg by mouth daily.   Yes [provider]  latanoprost (XALATAN) 0.005 % ophthalmic solution Place 1 drop into both eyes at bedtime.   Yes [provider]  losartan (COZAAR) 100 MG tablet Take 100 mg by mouth daily.   Yes [provider]  methylPREDNISolone (MEDROL DOSEPAK) 4 MG TBPK tablet Take 4 mg by mouth as directed. 11/03/23  Yes [provider]  omeprazole (PRILOSEC) 40 MG capsule Take 40 mg by mouth daily.   Yes [provider]  rosuvastatin (CRESTOR) 10 MG tablet Take 10 mg by mouth at bedtime.   Yes [provider]  traMADol (ULTRAM) 50 MG tablet Take 50 mg by mouth every 6 (six) hours as needed for moderate pain (pain score 4-6). 11/03/23  Yes [provider]  tiZANidine (ZANAFLEX) 4 MG tablet Take 4 mg by mouth 3 (three) times daily. 10/28/23   [provider]    Past Medical History:  Diagnosis Date   Hypertension     History reviewed. No pertinent surgical history.   reports that he has never smoked. He has never used smokeless tobacco. He reports current alcohol use. He reports that he does not use drugs.  History reviewed. No pertinent family history.  Physical Exam: Vitals:   11/06/23 0000 11/06/23 0030 11/06/23 0100 11/06/23 0130  BP: 129/82 129/76 (!) 115/98 (!) 135/104  Pulse: 99 80 89 97  Resp: 20 20 (!) 24 19  Temp:      TempSrc:      SpO2: 99% 97% 95% 98%  Weight:      Height:        Gen: Awake, alert, NAD   CV: Tachycardic, regular, normal S1, S2, no murmurs.  Radial pulses are 2+, femoral pulses 2+ on the right, 1+ on the left, popliteal nonpalpable on the right, 1+ on the left, unable to palpate pedal pulses on the right, left PT is 1+, unable to palpate DP on the left.   Resp: Normal WOB, CTAB  Abd: Flat, normoactive, nontender MSK: Symmetric, possibly  some mild atrophy in the lower legs. Skin: On the palms and soles of the feet there are scaled hyperpigmented macular lesions, hyperpigmentary changes over the hands.  The right lower leg is cool below the knee. neuro: Alert and interactive, on the right there is sensory loss to fine touch below the ankle, intact distally on the left.  Motor in RLE is 5 out of 5 with hip flexion extension, 5 out of 5 with knee flexion and extension, 3 out of 5 with dorsiflexion and 4 out of 5 with plantarflexion on the right.  5 out of 5 throughout on the left.  psych: euthymic, appropriate    Data review:   Labs reviewed, notable for:   Lactate 1.9 -> 2.1 Bicarb 18, anion gap 14 Creatinine 2.19, baseline 1.22 AST 114, other LFT normal -> UA positive hemoglobin but no RBCs High-sensitivity troponin 22 -> 26 Blood counts normal, coags pending   Micro:  No results found for this or any previous visit.  Imaging reviewed:  DG Chest Port 1 View Result Date: 11/05/2023 CLINICAL DATA:  AFib. EXAM: PORTABLE CHEST 1 VIEW COMPARISON:  08/11/2005. FINDINGS: The heart size and mediastinal contours are within normal limits. There is atherosclerotic calcification of the aorta. No consolidation, effusion, or pneumothorax is seen. No acute osseous abnormality. IMPRESSION: No active disease. Electronically Signed   By: Thornell Sartorius M.D.   On: 11/05/2023 21:58   CT Angio Aortobifemoral W and/or Wo Contrast Result Date: 11/05/2023 CLINICAL DATA:  Claudication or leg ischemia Pt reports he has been having pain in his right foot for about 3 weeks and has had a negative [place] and xray and was given an MRI yesterday and was told a nerve was pressing on something EXAM: CT ANGIOGRAPHY OF ABDOMINAL AORTA WITH ILIOFEMORAL RUNOFF TECHNIQUE: Multidetector CT imaging of the abdomen, pelvis and lower extremities was performed using the standard protocol during bolus administration of intravenous contrast. Multiplanar CT image  reconstructions and MIPs were obtained to evaluate the vascular anatomy. RADIATION DOSE REDUCTION: This exam was performed according to the departmental dose-optimization program which includes automated exposure control, adjustment of the mA and/or kV according to patient size and/or use of iterative reconstruction technique. CONTRAST:  OMNIPAQUE IOHEXOL 350 MG/ML SOLN COMPARISON:  None Available. FINDINGS: VASCULAR Aorta: Severe atherosclerotic plaque. Normal caliber aorta without aneurysm, dissection, vasculitis or significant stenosis. Celiac: Patent without evidence of aneurysm, dissection, vasculitis or significant stenosis. SMA: Patent without evidence of aneurysm, dissection, vasculitis or significant stenosis. Renals: Both renal arteries are patent without evidence of aneurysm, dissection, vasculitis, fibromuscular dysplasia or significant stenosis. IMA: Patent without evidence of aneurysm, dissection, vasculitis or significant stenosis. RIGHT Lower Extremity Severe  atherosclerotic plaque. Short segment severe narrowing of the left external iliac artery (4:103). 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 A 2 cm almost complete occlusion of the origin of the RIGHT superficial femoral artery. Re-opacification distally. A 4 cm almost complete occlusion of the RIGHT proximal superficial femoral artery. Reopacification distally. LEFT Lower Extremity Severe atherosclerotic plaque. A 3 cm almost complete occlusion of the LEFT common femoral artery. Re-opacification distally with three-vessel runoff. VEINS: No obvious venous abnormality within the limitations of this arterial phase study. Review of the MIP images confirms the above findings. NON-VASCULAR Lower chest: No acute abnormality. Hepatobiliary: No focal liver abnormality. No gallstones, gallbladder wall thickening, or pericholecystic fluid. No biliary dilatation. Pancreas: No focal lesion. Normal  pancreatic contour. No surrounding inflammatory changes. No main pancreatic ductal dilatation. Spleen: Normal in size without focal abnormality. Adrenals/Urinary Tract: No adrenal nodule bilaterally. Bilateral kidneys enhance symmetrically. Fluid density lesions of the left kidney likely represent simple renal cysts. Simple renal cysts, in the absence of clinically indicated signs/symptoms, require no independent follow-up. No hydronephrosis. No hydroureter. The urinary bladder is unremarkable. Stomach/Bowel: Stomach is within normal limits. No evidence of bowel wall thickening or dilatation. Appendix appears normal. Lymphatic: No lymphadenopathy. Reproductive: Prostate is unremarkable. Other: No intraperitoneal free fluid. No intraperitoneal free gas. No organized fluid collection. Musculoskeletal: No abdominal wall hernia or abnormality. No suspicious lytic or blastic osseous lesions. No acute displaced fracture. Multilevel degenerative changes of the spine. No evidence of fracture, dislocation, or joint effusion of either lower extremity. No evidence of severe arthropathy and no aggressive appearing focal bone abnormality a either lower extremities. Soft tissues are unremarkable. IMPRESSION: VASCULAR 1.  Aortic Atherosclerosis (ICD10-I70.0)-severe. 2. 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 3. A 2 cm almost complete occlusion of the origin of the RIGHT superficial femoral artery. Re-opacification distally. 4. A 4 cm almost complete occlusion of the RIGHT proximal superficial femoral artery. Reopacification distally. 5. A 3 cm almost complete occlusion of the LEFT common femoral artery. Re-opacification distally with three-vessel runoff. NON-VASCULAR 1. No acute intra-abdominal or intrapelvic abnormality. 2. No acute abnormality of the bilateral lower extremities. These results were called by telephone at the time of interpretation on 11/05/2023 at 9:00 pm to  provider Ariel PA, who verbally acknowledged these results. Electronically Signed   By: Tish Frederickson M.D.   On: 11/05/2023 21:07   Data review: Recent workup of right leg pain:   MRI 1/15 per ED note:  "MRI report shows L2-L3 disc bulge with indentation of the ventral thecal sac, lateral recess stenosis with mild abutment of the bilateral L3 nerve roots, L3-L4 disc bulge with biforaminal endplate hypertrophy, moderate left and moderate right foraminal stenosis with abutment of exiting L3 nerve roots; 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"   1/8 XR R foot FINDINGS:  No acute fracture. Normal joint alignment. Mild first MTP joint space narrowing and osteophytes. Enthesopathic changes at the fifth metatarsal base. Retained metallic foreign body overlying the distal aspect of the lateral leg.  IMPRESSION:  No acute osseous abnormality.  Mild first MTP joint osteoarthrosis.   1/8 XR R ankle  FINDINGS:  Small retained metallic foreign body overlying the lateral distal leg. No evidence for acute fracture. Joint alignment is normal. Fifth metatarsal base enthesophyte. Mild degenerative osseous overgrowth  of the dorsal midfoot. No focal soft tissue swelling.  IMPRESSION:  No acute osseous abnormality.  Metallic foreign body overlying the lateral distal leg.    12/20: Venous duplex:  FINDINGS: VENOUS Normal compressibility of the common femoral, superficial femoral, and popliteal veins, as well as the visualized calf veins. Visualized portions of profunda femoral vein and great saphenous vein unremarkable. No filling defects to suggest DVT on grayscale or color Doppler imaging. Doppler waveforms show normal direction of venous flow, normal respiratory plasticity and response to augmentation. Limited views of the contralateral common femoral vein  are unremarkable. OTHER None. Limitations: None. IMPRESSION: Negative.  12/20: XR Tib fib R  FINDINGS: No fracture or dislocation. Preserved joint spaces and bone mineralization. There is a tiny radiopaque density seen in the soft tissues posterior to the distal fibula. Please correlate for a foreign body. IMPRESSION: No acute osseous abnormality. Possible radiopaque foreign body in the soft tissues posterior to the distal fibula.   EKG: personally reviewed A flutter with variable conduction, rate 105, RSR prime in V1, poor R wave progression, no acute ischemic changes.  ED Course:  Imaging obtained above demonstrating right popliteal occlusion and no runoff to the foot.  His case was discussed with vascular surgery Dr. Lenell Antu recommends for transfer to [facility] and initiation of heparin drip.  Otherwise in the ED found to have a flutter with RVR and started on a diltiazem drip with adequate rate control.  Otherwise treated with 1.5 L IV fluid, fentanyl, Dilaudid, Zofran.   Assessment/Plan:  78 y.o. male with hx hypertension, diabetes type 2, hyperlipidemia, CKD 3, recent ED and outpatient visits for right leg pain, who presents due to persistent symptoms.  Found to have likely subacute arterial occlusion at right popliteal artery with no runoff to the foot, as well as chronic findings of PAD.   Right popliteal artery occlusion, suspect subacute  Associated sensory loss in the right foot and right foot drop, threatened limb  PAD with bilateral SFA occlusions with reconstitution distally Likely chronic PAD with his worsening R leg symptoms representing now subacute occlusion at R popliteal artery (onset of symptoms before 12/20). Etiology of this possibly atherothrombotic/embolic from underlying PAD v less likely cardioembolic with newly identified Aflutter. On exam cool RLE below the knee with no pedal pulses on the R, there is numbness in stocking distribution below the ankle on  the R side, and strength is 3/5 with dorsiflexion at the ankle, no tissue loss. Labs with lactate 1.9 -> 2.1, suspect possible rhabdo with CK pending. CTA with complete occlusion at R popliteal with no runoff to the foot, additional occlusions R SFA origin + prox SFA with reconstitution, and L CFA with reconstitution and 3 vessel runoff on the left.  -EDP has discussed with vascular surgery Dr. Lenell Antu recommends for transfer to [facility] and initiation of heparin drip.  -Heparin drip pharmacy to dose -Check lipid panel and A1c -Continue rosuvastatin 10 mg, currently maxed for renal function -Serial neurovascular checks of the right lower extremity -Symptomatic management: Tylenol as needed mild, oxycodone 2.5/5 mg for moderate/severe, Dilaudid 0.5 mg IV every 4 hours for breakthrough. -N.p.o. after midnight in case needs intervention in the a.m.  If no beds available may need to send ED to ED to expedite  A-flutter, new onset Initially in the ED RVR into the 150s, placed on a diltiazem drip with appropriate rate control. -Has stabilized on 10 mg/h on diltiazem drip, transition to p.o. starting at 60 mg  p.o. every 6 hour.  Overlap with drip x 1 to 2 hours then stop.  Can reinitiate drip if worsening rate control. -On heparin per above will also cover for A-fib, will need to address long-term anticoagulation following interventions above.  CHA2DS2-VASc is 7.  -TTE to eval for thrombus given arterial occlusion, will also eval for underlying substrate for afib  -Check TSH  Lactic acidosis Suspect combined hypovolemic and ischemic from arterial occlusion -Status post 1.5 L IV fluid.  Given additional 500 cc and started on maintenance IV fluid normal saline at 100 cc/h  Likely rhabdomyolysis Isolated elevation in AST, myoglobinuria -Check CK, if elevated will trend - On IV fluids per above can adjust as needed  Acute myocardial injury No history of chest pain.  High-sensitivity troponin 22 ->  26.  EKG with a flutter with no acute ischemic changes. - Likely demand event in the setting of arrhythmia, management directed at underlying A-fib above.  Acute kidney injury stage I Background CKD stage IIIa Baseline creatinine approximately 1.22, elevated to 2.19 on admission.  Suspect mixed prerenal and intrinsic injury possibly from rhabdomyolysis. -IV fluids per above -Evaluation of rhabdo per above -Hold osm losartan, chlorthalidone -Check PVR  Mild hypercalcemia - Trend with IV fluids, if remains elevated can evaluate further.  Incidental findings on recent evaluation for leg pain: Lumbar spinal stenosis, neuroforaminal narrowing multilevel: Do not feel this is cause of his neurologic deficit per above, outpatient f/u.   Chronic medical problems Hypertension: Holding his home losartan and chlorthalidone in the setting of AKI.  Stop felodipine since starting on diltiazem. Diabetes type 2: Diet controlled: Check A1c, SSI while inpatient HLD: Check LDL. On rosuvastatin 10 mg max renal dose for now.  May be able to escalate as renal function improves or switch to atorvastatin. GERD: Sub home PPI Glaucoma: Continue home latanoprost drops  Body mass index is 24.6 kg/m.    DVT prophylaxis:  IV heparin gtts Code Status:  Full Code Diet:  Diet Orders (From admission, onward)     Start     Ordered   11/06/23 0001  Diet NPO time specified Except for: Sips with Meds  Diet effective midnight       Question:  Except for  Answer:  Clearance Coots with Meds   11/05/23 2159           Family Communication: Yes discussed with his daughter at the bedside Consults: Vascular surgery Admission status:   Inpatient, Step Down Unit -> [place]  Severity of Illness: The appropriate patient status for this patient is INPATIENT. Inpatient status is judged to be reasonable and necessary in order to provide the required intensity of service to ensure the patient's safety. The patient's presenting  symptoms, physical exam findings, and initial radiographic and laboratory data in the context of their chronic comorbidities is felt to place them at high risk for further clinical deterioration. Furthermore, it is not anticipated that the patient will be medically stable for discharge from the hospital within 2 midnights of admission.   * I certify that at the point of admission it is my clinical judgment that the patient will require inpatient hospital care spanning beyond 2 midnights from the point of admission due to high intensity of service, high risk for further deterioration and high frequency of surveillance required.*   Dolly Rias, MD [facility]  How to contact the [facility] Attending or Consulting provider 7A - 7P or covering provider during after hours 7P -7A, for this  patient.  Check the care team in [facility] and look for a) attending/consulting [facility] provider listed and b) the [facility] team listed Log into [email or web address] and use [place]'s universal password to access. If you do not have the password, please contact the hospital operator. Locate the [facility] provider you are looking for under [facility] and page to a number that you can be directly reached. If you still have difficulty reaching the provider, please page the [facility] (Director on Call) for the Hospitalists listed on amion for assistance.  11/06/2023, 1:58 AM

## 2023-11-07 DIAGNOSIS — I70209 Unspecified atherosclerosis of native arteries of extremities, unspecified extremity: Secondary | ICD-10-CM | POA: Diagnosis not present

## 2023-11-07 DIAGNOSIS — I4891 Unspecified atrial fibrillation: Secondary | ICD-10-CM | POA: Diagnosis not present

## 2023-11-07 DIAGNOSIS — I10 Essential (primary) hypertension: Secondary | ICD-10-CM | POA: Diagnosis not present

## 2023-11-07 LAB — PHOSPHORUS: Phosphorus: 2.7 mg/dL (ref 2.5–4.6)

## 2023-11-07 LAB — COMPREHENSIVE METABOLIC PANEL
ALT: 21 U/L (ref 0–44)
AST: 183 U/L — ABNORMAL HIGH (ref 15–41)
Albumin: 3.3 g/dL — ABNORMAL LOW (ref 3.5–5.0)
Alkaline Phosphatase: 48 U/L (ref 38–126)
Anion gap: 10 (ref 5–15)
BUN: 29 mg/dL — ABNORMAL HIGH (ref 8–23)
CO2: 19 mmol/L — ABNORMAL LOW (ref 22–32)
Calcium: 9.1 mg/dL (ref 8.9–10.3)
Chloride: 105 mmol/L (ref 98–111)
Creatinine, Ser: 1.5 mg/dL — ABNORMAL HIGH (ref 0.61–1.24)
GFR, Estimated: 47 mL/min — ABNORMAL LOW (ref >60)
Glucose, Bld: 156 mg/dL — ABNORMAL HIGH (ref 70–99)
Potassium: 4.1 mmol/L (ref 3.5–5.1)
Sodium: 134 mmol/L — ABNORMAL LOW (ref 135–145)
Total Bilirubin: 0.8 mg/dL (ref 0.0–1.2)
Total Protein: 7 g/dL (ref 6.5–8.1)

## 2023-11-07 LAB — CK: Total CK: 12526 U/L — ABNORMAL HIGH (ref 49–397)

## 2023-11-07 LAB — CBC
HCT: 26 % — ABNORMAL LOW (ref 39.0–52.0)
Hemoglobin: 8.9 g/dL — ABNORMAL LOW (ref 13.0–17.0)
MCH: 29.8 pg (ref 26.0–34.0)
MCHC: 34.2 g/dL (ref 30.0–36.0)
MCV: 87 fL (ref 80.0–100.0)
Platelets: 182 10*3/uL (ref 150–400)
RBC: 2.99 MIL/uL — ABNORMAL LOW (ref 4.22–5.81)
RDW: 13.4 % (ref 11.5–15.5)
WBC: 9.4 10*3/uL (ref 4.0–10.5)
nRBC: 0 % (ref 0.0–0.2)

## 2023-11-07 LAB — GLUCOSE, CAPILLARY
Glucose-Capillary: 153 mg/dL — ABNORMAL HIGH (ref 70–99)
Glucose-Capillary: 145 mg/dL — ABNORMAL HIGH (ref 70–99)
Glucose-Capillary: 152 mg/dL — ABNORMAL HIGH (ref 70–99)
Glucose-Capillary: 132 mg/dL — ABNORMAL HIGH (ref 70–99)

## 2023-11-07 LAB — MAGNESIUM: Magnesium: 1.7 mg/dL (ref 1.7–2.4)

## 2023-11-07 LAB — HEPARIN LEVEL (UNFRACTIONATED): Heparin Unfractionated: 0.85 [IU]/mL — ABNORMAL HIGH (ref 0.30–0.70)

## 2023-11-07 MED ORDER — CHLORHEXIDINE GLUCONATE CLOTH 2 % EX PADS
6.0000 | MEDICATED_PAD | Freq: Every day | CUTANEOUS | Status: AC
  Administered 2023-11-07 – 2023-11-11 (×5): 6 via TOPICAL

## 2023-11-07 MED ORDER — LACTATED RINGERS IV BOLUS
500.0000 mL | Freq: Once | INTRAVENOUS | Status: AC
  Administered 2023-11-07: 500 mL via INTRAVENOUS

## 2023-11-07 MED ORDER — DILTIAZEM HCL 30 MG PO TABS
60.0000 mg | ORAL_TABLET | Freq: Four times a day (QID) | ORAL | Status: DC
  Administered 2023-11-07 – 2023-11-08 (×3): 60 mg via ORAL
  Filled 2023-11-07 (×3): qty 2

## 2023-11-07 MED ORDER — MUPIROCIN 2 % EX OINT
1.0000 | TOPICAL_OINTMENT | Freq: Two times a day (BID) | CUTANEOUS | Status: AC
  Administered 2023-11-07 – 2023-11-11 (×9): 1 via NASAL
  Filled 2023-11-07 (×3): qty 22

## 2023-11-07 NOTE — Plan of Care (Signed)
  Problem: Fluid Volume: Goal: Ability to maintain a balanced intake and output will improve Outcome: Progressing   Problem: Coping: Goal: Ability to adjust to condition or change in health will improve Outcome: Progressing   Problem: Metabolic: Goal: Ability to maintain appropriate glucose levels will improve Outcome: Progressing   Problem: Nutritional: Goal: Maintenance of adequate nutrition will improve Outcome: Progressing Goal: Progress toward achieving an optimal weight will improve Outcome: Progressing   Problem: Health Behavior/Discharge Planning: Goal: Ability to manage health-related needs will improve Outcome: Progressing   Problem: Health Behavior/Discharge Planning: Goal: Ability to manage health-related needs will improve Outcome: Progressing   Problem: Clinical Measurements: Goal: Ability to maintain clinical measurements within normal limits will improve Outcome: Progressing Goal: Will remain free from infection Outcome: Progressing Goal: Diagnostic test results will improve Outcome: Progressing Goal: Respiratory complications will improve Outcome: Progressing Goal: Cardiovascular complication will be avoided Outcome: Progressing

## 2023-11-07 NOTE — Progress Notes (Incomplete)
PHARMACY - ANTICOAGULATION CONSULT NOTE  Pharmacy Consult for Heparin Infusion Indication:  arterial occlusion  No Known Allergies  Patient Measurements: Height: 5\' 8"  (172.7 cm) Weight: 72.6 kg (160 lb) IBW/kg (Calculated) : 68.4 Heparin Dosing Weight: 73.4 kg  Vital Signs: Temp: 98.6 F (37 C) (01/17 2027) Temp Source: Oral (01/17 2027) BP: 114/88 (01/18 0700) Pulse Rate: 98 (01/18 0700)  Labs: Recent Labs    11/05/23 1748 11/05/23 1831 11/05/23 1834 11/05/23 2020 11/05/23 2202 11/05/23 2355 11/06/23 0411 11/06/23 1207 11/06/23 2204 11/07/23 0345  HGB 13.3  --   --   --   --   --  11.9* 8.8*  --  8.9*  HCT 39.5  --   --   --   --   --  35.6* 26.0*  --  26.0*  PLT 244  --   --   --   --   --  229  --   --  182  APTT  --   --   --   --   --  113*  --   --   --   --   LABPROT  --   --   --   --  14.2  --  14.5  --   --   --   INR  --   --   --   --  1.1  --  1.1  --   --   --   HEPARINUNFRC  --   --   --   --   --   --  0.78*  --  >1.10*  --   CREATININE  --  2.19*  --   --   --   --  1.59*  --   --  1.50*  CKTOTAL  --   --   --   --   --  5,733*  --   --   --  12,526*  TROPONINIHS  --   --  22* 26*  --   --   --   --   --   --     Estimated Creatinine Clearance: 39.3 mL/min (A) (by C-G formula based on SCr of 1.5 mg/dL (H)).   Medical History: Past Medical History:  Diagnosis Date   Hypertension    Assessment: Patient is a 78yo male presenting with severe right leg pain. CT angiogram shows arterial occlusion. Reviewed home meds and no anticoagulants noted.Pharmacy consulted for Heparin dosing.  1/18 AM: Patient is s/p endarterectomy on 1/17 PM. CBC shows Hgb drop from 13.3 > 11.9 > and plts 244 > 229 >  Goal of Therapy:  Heparin level 0.3-0.7 units/ml Monitor platelets by anticoagulation protocol: Yes   Plan:  ___ Check anti-Xa level in 8 hours and daily while on heparin Continue to monitor H&H and platelets  Blane Ohara, PharmD, BCPS  PGY2  Pharmacy Resident

## 2023-11-07 NOTE — Progress Notes (Signed)
Patient's heart rate was increased to 120s while he was talking and eating his breakfast, BP was 94/67 so didn't increased the dose of Cardizem and informed to the MD via phone call, ordered to give LR 500 bolus and to decrease cardizem to 5mg /hr. Medication titrated as per order, will continue to monitor  11/07/23 0833  Vitals  Pulse Rate (!) 126  Pulse Rate Source Monitor  ECG Heart Rate (!) 102  Resp 20  MEWS COLOR  MEWS Score Color Yellow  Oxygen Therapy  SpO2 100 %  O2 Device Room Air  MEWS Score  MEWS Temp 0  MEWS Systolic 1  MEWS Pulse 1  MEWS RR 0  MEWS LOC 0  MEWS Score 2

## 2023-11-07 NOTE — Progress Notes (Signed)
Call received from MD regarding his Cardizem drip, ordered to stop cardizem after reducing dose to 2.5mg  for an hour then to give his PO dose of cardizem.  Meds titrated as per the order.

## 2023-11-07 NOTE — Progress Notes (Signed)
Arterial line was present in left radius of patient when he arrived from previous unit to [address].  Patient's arterial line removed at this time d/t MD order. Patient dressed with 2x2 gauze and medipore tape after establishing hemostasis.  0430am: Patient continues to have no sign of bleeding at site of arterial line removal. Dressing is clean,dry, and intact.

## 2023-11-07 NOTE — Progress Notes (Incomplete)
PHARMACY - ANTICOAGULATION CONSULT NOTE  Pharmacy Consult for Heparin Infusion Indication:  arterial occlusion  No Known Allergies  Patient Measurements: Height: 5\' 8"  (172.7 cm) Weight: 72.6 kg (160 lb) IBW/kg (Calculated) : 68.4 Heparin Dosing Weight: 73.4 kg  Vital Signs: Temp: 98.6 F (37 C) (01/17 2027) Temp Source: Oral (01/17 2027) BP: 114/88 (01/18 0700) Pulse Rate: 98 (01/18 0700)  Labs: Recent Labs    11/05/23 1748 11/05/23 1831 11/05/23 1834 11/05/23 2020 11/05/23 2202 11/05/23 2355 11/06/23 0411 11/06/23 1207 11/06/23 2204 11/07/23 0345  HGB 13.3  --   --   --   --   --  11.9* 8.8*  --  8.9*  HCT 39.5  --   --   --   --   --  35.6* 26.0*  --  26.0*  PLT 244  --   --   --   --   --  229  --   --  182  APTT  --   --   --   --   --  113*  --   --   --   --   LABPROT  --   --   --   --  14.2  --  14.5  --   --   --   INR  --   --   --   --  1.1  --  1.1  --   --   --   HEPARINUNFRC  --   --   --   --   --   --  0.78*  --  >1.10*  --   CREATININE  --  2.19*  --   --   --   --  1.59*  --   --  1.50*  CKTOTAL  --   --   --   --   --  5,733*  --   --   --  12,526*  TROPONINIHS  --   --  22* 26*  --   --   --   --   --   --     Estimated Creatinine Clearance: 39.3 mL/min (A) (by C-G formula based on SCr of 1.5 mg/dL (H)).   Medical History: Past Medical History:  Diagnosis Date   Hypertension    Assessment: Patient is a 78yo male presenting with severe right leg pain. CT angiogram shows arterial occlusion. Reviewed home meds and no anticoagulants noted.Pharmacy consulted for Heparin dosing.  1/18 AM: CBC shows Hgb drop from 13.3 > 11.9 > and plts 244 > 229 >  Goal of Therapy:  Heparin level 0.3-0.7 units/ml Monitor platelets by anticoagulation protocol: Yes   Plan:  ___ Check anti-Xa level in 8 hours and daily while on heparin Continue to monitor H&H and platelets  Blane Ohara, PharmD, BCPS  PGY2 Pharmacy Resident

## 2023-11-07 NOTE — Progress Notes (Signed)
PHARMACY - ANTICOAGULATION CONSULT NOTE  Pharmacy Consult for Heparin Infusion Indication:  arterial occlusion  No Known Allergies  Patient Measurements: Height: 5\' 8"  (172.7 cm) Weight: 72.6 kg (160 lb) IBW/kg (Calculated) : 68.4 Heparin Dosing Weight: 73.4 kg  Vital Signs: Temp: 98.6 F (37 C) (01/18 1600) Temp Source: Oral (01/18 1600) BP: 117/68 (01/18 1600) Pulse Rate: 96 (01/18 1634)  Labs: Recent Labs    11/05/23 1748 11/05/23 1831 11/05/23 1834 11/05/23 2020 11/05/23 2202 11/05/23 2355 11/06/23 0411 11/06/23 1207 11/06/23 2204 11/07/23 0345 11/07/23 1539  HGB 13.3  --   --   --   --   --  11.9* 8.8*  --  8.9*  --   HCT 39.5  --   --   --   --   --  35.6* 26.0*  --  26.0*  --   PLT 244  --   --   --   --   --  229  --   --  182  --   APTT  --   --   --   --   --  113*  --   --   --   --   --   LABPROT  --   --   --   --  14.2  --  14.5  --   --   --   --   INR  --   --   --   --  1.1  --  1.1  --   --   --   --   HEPARINUNFRC  --   --   --   --   --   --  0.78*  --  >1.10*  --  0.85*  CREATININE  --  2.19*  --   --   --   --  1.59*  --   --  1.50*  --   CKTOTAL  --   --   --   --   --  5,733*  --   --   --  12,526*  --   TROPONINIHS  --   --  22* 26*  --   --   --   --   --   --   --     Estimated Creatinine Clearance: 39.3 mL/min (A) (by C-G formula based on SCr of 1.5 mg/dL (H)).   Medical History: Past Medical History:  Diagnosis Date   Hypertension    Assessment: Patient is a 78yo male presenting with severe right leg pain. CT angiogram shows arterial occlusion. Reviewed home meds and no anticoagulants noted.Pharmacy consulted for Heparin dosing.  Heparin level elevated  Goal of Therapy:  Heparin level 0.3-0.7 units/ml Monitor platelets by anticoagulation protocol: Yes   Plan:  Decrease heparin to 1050 units / hr Follow up 8 hour heparin level  Thank you Okey Regal, PharmD 11/07/2023 4:53 PM

## 2023-11-07 NOTE — Evaluation (Signed)
Occupational Therapy Evaluation Patient Details Name: Manuel Winters MRN: [id number] DOB: Mar 15, 1946 Today's Date: 11/07/2023   History of Present Illness Pt is a 78 y/o male presenting with persistent right lower extremity pain in setting of limb ischemia. Underwent R common femoral endarterectomy, thrombectomy via femoral approach, popliteal cutdown w/ tibial thrombectomy and 4 compartment fasciotomies on 1/17. PMH: HTN, DM2, HLD, CKD   Clinical Impression   PTA, pt lives with spouse and reports completely independent in all daily tasks without AD at baseline. Pt presents now with deficits in RLE pain and cardiopulmonary endurance. Pt eager for ADL session though limited to EOB due to fluctuating HR (up to 140s with EOB ADLs). Overall, pt requires Setup for UB ADLs and Min-Mod A for LB ADLs. Pt also able to demo lateral scooting along bedside fairly well. Pt's spouse works so pt will be need to be able to safely manage during the day at home alone. Will finalize DC recommendations once OOB ADLs/mobility able to be assessed pending HR control but anticipate good progress based on pt motivation.      If plan is discharge home, recommend the following: A lot of help with walking and/or transfers;A lot of help with bathing/dressing/bathroom;Assistance with cooking/housework;Assist for transportation;Help with stairs or ramp for entrance    Functional Status Assessment  Patient has had a recent decline in their functional status and demonstrates the ability to make significant improvements in function in a reasonable and predictable amount of time.  Equipment Recommendations  Other (comment);BSC/3in1 (RW; TBD)    Recommendations for Other Services       Precautions / Restrictions Precautions Precautions: Fall;Other (comment) Precaution Comments: watch HR Restrictions Weight Bearing Restrictions Per Provider Order: No      Mobility Bed Mobility Overal bed mobility: Needs Assistance Bed  Mobility: Supine to Sit, Sit to Supine, Rolling Rolling: Mod assist   Supine to sit: Min assist Sit to supine: Mod assist   General bed mobility comments: Min A for RLE support to EOB and to scoot with bed pad. good effort using bedrails. Mod A for BLE back to bed. Mod A to roll for bed linen change    Transfers Overall transfer level: Needs assistance                 General transfer comment: deferred standing due to rising HR with EOB ADLs but pt able to demo lateral scooting towards [facility] with limited assist      Balance Overall balance assessment: Needs assistance Sitting-balance support: No upper extremity supported, Feet supported Sitting balance-Leahy Scale: Fair                                     ADL either performed or assessed with clinical judgement   ADL Overall ADL's : Needs assistance/impaired Eating/Feeding: Independent   Grooming: Set up;Sitting;Wash/dry face;Oral care;Wash/dry hands;Applying deodorant Grooming Details (indicate cue type and reason): EOB Upper Body Bathing: Set up;Sitting   Lower Body Bathing: Minimal assistance;Sitting/lateral leans;Bed level Lower Body Bathing Details (indicate cue type and reason): sitting EOB, able to reach down LE fairly well, leaning back for anterior peri care and side to side to bathe bottom Upper Body Dressing : Set up;Sitting   Lower Body Dressing: Moderate assistance;Sitting/lateral leans;Bed level       Toileting- Clothing Manipulation and Hygiene: Moderate assistance;Sitting/lateral lean;Bed level         General ADL Comments:  Limited to EOB due to fluctuating HR but pt eager to complete bathing task, able to manage most of it sitting EOB with limited assist. discussed pain and HR barriers today w/ initial discussions of rehab options     Vision Ability to See in Adequate Light: 0 Adequate Patient Visual Report: No change from baseline Vision Assessment?: No apparent visual deficits      Perception         Praxis         Pertinent Vitals/Pain Pain Assessment Pain Assessment: Faces Faces Pain Scale: Hurts even more Pain Location: RLE w/ movement Pain Descriptors / Indicators: Grimacing, Guarding, Sore Pain Intervention(s): Monitored during session, Premedicated before session     Extremity/Trunk Assessment Upper Extremity Assessment Upper Extremity Assessment: Overall WFL for tasks assessed;Right hand dominant   Lower Extremity Assessment Lower Extremity Assessment: Defer to PT evaluation   Cervical / Trunk Assessment Cervical / Trunk Assessment: Normal   Communication Communication Communication: No apparent difficulties Cueing Techniques: Gestural cues;Verbal cues   Cognition Arousal: Alert Behavior During Therapy: WFL for tasks assessed/performed Overall Cognitive Status: Within Functional Limits for tasks assessed                                       General Comments  Wife at bedside    Exercises     Shoulder Instructions      Home Living Family/patient expects to be discharged to:: Private residence Living Arrangements: Spouse/significant other Available Help at Discharge: Family;Available PRN/intermittently Type of Home: Mobile home Home Access: Stairs to enter Entrance Stairs-Number of Steps: 2   Home Layout: One level     Bathroom Shower/Tub: Chief Strategy Officer: Standard     Home Equipment: Shower seat          Prior Functioning/Environment Prior Level of Function : Independent/Modified Independent;Driving             Mobility Comments: no AD typically but limited by LE pain recently ADLs Comments: Indep with ADLs, IADLs, enjoys being active        OT Problem List: Decreased activity tolerance;Impaired balance (sitting and/or standing);Cardiopulmonary status limiting activity;Decreased knowledge of use of DME or AE;Decreased knowledge of precautions;Pain      OT  Treatment/Interventions: Self-care/ADL training;Therapeutic exercise;Energy conservation;DME and/or AE instruction;Therapeutic activities;Patient/family education;Balance training    OT Goals(Current goals can be found in the care plan section) Acute Rehab OT Goals Patient Stated Goal: do whatever i need to to go home OT Goal Formulation: With patient/family Time For Goal Achievement: 11/21/23 Potential to Achieve Goals: Good ADL Goals Pt Will Perform Lower Body Bathing: with supervision;sitting/lateral leans;sit to/from stand Pt Will Perform Lower Body Dressing: with supervision;sitting/lateral leans;sit to/from stand Pt Will Transfer to Toilet: with contact guard assist;ambulating  OT Frequency: Min 1X/week    Co-evaluation              AM-PAC OT "6 Clicks" Daily Activity     Outcome Measure Help from another person eating meals?: None Help from another person taking care of personal grooming?: A Little Help from another person toileting, which includes using toliet, bedpan, or urinal?: A Lot Help from another person bathing (including washing, rinsing, drying)?: A Little Help from another person to put on and taking off regular upper body clothing?: A Little Help from another person to put on and taking off regular lower body clothing?: A Lot  6 Click Score: 17   End of Session Nurse Communication: Mobility status;Other (comment) (RN in to assess HR and assist with bed linen)  Activity Tolerance: Patient limited by pain;Treatment limited secondary to medical complications (Comment) Patient left: in bed;with call bell/phone within reach;with bed alarm set;with family/visitor present;with nursing/sitter in room  OT Visit Diagnosis: Unsteadiness on feet (R26.81);Other abnormalities of gait and mobility (R26.89);Muscle weakness (generalized) (M62.81)                Time: 1610-9604 OT Time Calculation (min): 40 min Charges:  OT General Charges $OT Visit: 1 Visit OT  Evaluation $OT Eval Moderate Complexity: 1 Mod OT Treatments $Self Care/Home Management : 23-37 mins  Bradd Canary, OTR/L Acute Rehab Services Office: [phone]   Lorre Munroe 11/07/2023, 1:48 PM

## 2023-11-07 NOTE — Progress Notes (Signed)
Informed to MD about his recent BP, MD ordered verbally to decrease the dose by 2.5mg /hr.   11/07/23 1715  Vitals  BP (!) 92/59  MAP (mmHg) 70  BP Location Right Arm  BP Method Automatic  Patient Position (if appropriate) Lying  Pulse Rate 96  Pulse Rate Source Monitor  ECG Heart Rate 92  Resp 18  Level of Consciousness  Level of Consciousness Alert  MEWS COLOR  MEWS Score Color Green  Oxygen Therapy  SpO2 100 %  O2 Device Room Air  MEWS Score  MEWS Temp 0  MEWS Systolic 1  MEWS Pulse 0  MEWS RR 0  MEWS LOC 0  MEWS Score 1

## 2023-11-07 NOTE — Progress Notes (Addendum)
  Progress Note    11/07/2023 8:24 AM 1 Day Post-Op  Subjective:  says his right foot is feeling much better  Afebrile Afib 96% RA  Vitals:   11/07/23 0700 11/07/23 0800  BP: 114/88 94/67  Pulse: 98 90  Resp: (!) 21 20  Temp:  98.6 F (37 C)  SpO2: 100% 100%    Physical Exam: General:  no distress Cardiac:  irregular Lungs:  non labored Incisions:  right groin is clean and dry; right lower medial fasciotomy appears healthy; some bloody ooze; right lateral incision looks good.   Extremities:  brisk doppler flow right DP/PT; decreased motor right foot   CBC    Component Value Date/Time   WBC 9.4 11/07/2023 0345   RBC 2.99 (L) 11/07/2023 0345   HGB 8.9 (L) 11/07/2023 0345   HCT 26.0 (L) 11/07/2023 0345   PLT 182 11/07/2023 0345   MCV 87.0 11/07/2023 0345   MCH 29.8 11/07/2023 0345   MCHC 34.2 11/07/2023 0345   RDW 13.4 11/07/2023 0345   LYMPHSABS 1.5 11/05/2023 1748   MONOABS 0.7 11/05/2023 1748   EOSABS 0.0 11/05/2023 1748   BASOSABS 0.0 11/05/2023 1748    BMET    Component Value Date/Time   NA 134 (L) 11/07/2023 0345   K 4.1 11/07/2023 0345   CL 105 11/07/2023 0345   CO2 19 (L) 11/07/2023 0345   GLUCOSE 156 (H) 11/07/2023 0345   BUN 29 (H) 11/07/2023 0345   CREATININE 1.50 (H) 11/07/2023 0345   CALCIUM 9.1 11/07/2023 0345   GFRNONAA 47 (L) 11/07/2023 0345    INR    Component Value Date/Time   INR 1.1 11/06/2023 0411     Intake/Output Summary (Last 24 hours) at 11/07/2023 0824 Last data filed at 11/07/2023 0600 Gross per 24 hour  Intake 1703 ml  Output 1860 ml  Net -157 ml      Assessment/Plan:  78 y.o. male is s/p:  Right CFA endarterectomy with bovine patch angiopasty, RLE thrombectomy, cutdown popliteal with tibial thrombectomy of the AT, PT, peroneal arteries and 4 compartment fasciotomies 11/06/2023 by Dr. Chestine Spore.   1 Day Post-Op   -brisk doppler flow right foot; still with decreased motor right foot. Will order PT/OT and needs to be  out of bed to chair tid at meal times.  -echo with EF 55-60% and no mention of thrombus -DVT prophylaxis:  heparin gtt -afib per primary team   Doreatha Massed, PA-C [facility] [phone] 11/07/2023 8:24 AM  I have seen and evaluated the patient. I agree with the PA note as documented above.  Status post right common femoral endarterectomy with right lower extremity thrombectomy including cutdown on the popliteal artery with tibial thrombectomy and 4 compartment fasciotomies.  Has brisk PT signal in the right foot.  Continue heparin.  Dressing changed at bedside and lateral fasciotomy closed with about half of the medial fasciotomy closed.  No significant swelling.  Can get out of bed and mobilize.  Still has pretty profound motor deficit in the right foot consistent with his initial presentation.  Needs to work with therapy.  Cephus Shelling, MD [facility] [place] Office: [phone]

## 2023-11-07 NOTE — Progress Notes (Signed)
Patient's arm was in flexion position, blood pressure rechecked and informed and Cardizem dose titrated as per the order.   11/07/23 1700  Vitals  BP (!) 162/120  MAP (mmHg) 134  BP Location Right Arm  BP Method Automatic  Patient Position (if appropriate) Lying  Pulse Rate 82  Resp (!) 29  MEWS COLOR  MEWS Score Color Yellow  Oxygen Therapy  SpO2 100 %  O2 Device Room Air  MEWS Score  MEWS Temp 0  MEWS Systolic 0  MEWS Pulse 0  MEWS RR 2  MEWS LOC 0  MEWS Score 2

## 2023-11-07 NOTE — Progress Notes (Signed)
Patient was working with OT, HR went up to 140s, Informed to the MD, patient was asymptomatic, cardizem restarted at 5mg /hr at 1324, as per verbal order via phone call.   11/07/23 1306  Vitals  Pulse Rate 80  Pulse Rate Source Monitor  ECG Heart Rate (!) 137  Resp (!) 28  MEWS COLOR  MEWS Score Color Red  Oxygen Therapy  SpO2 90 %  O2 Device Room Air  MEWS Score  MEWS Temp 0  MEWS Systolic 0  MEWS Pulse 3  MEWS RR 2  MEWS LOC 0  MEWS Score 5

## 2023-11-07 NOTE — Progress Notes (Signed)
[facility]                                                                               Manuel Winters, is a 78 y.o. male, DOB - 08/08/1946, MVH:[id number] Admit date - 11/05/2023    Outpatient Primary MD for the patient is Bowen, Lauren, MD  LOS - 2  days    Brief summary    78 yrs old Male with PMH significant for hypertension, diabetes type 2, hyperlipidemia, CKD 3, recent ED and outpatient visits for right leg pain, who presents due to persistent symptoms.  Initially reports that his pain has been ongoing for about 1 month. He is adamant that he had no symptoms of claudication or leg pain prior to a fall that occurred about a month ago.  He was chasing a truck and tripped and fell, reports about a week later developed pain from below the knee down to the foot which is intermittent and severe.  He denies any numbness or weakness in the foot but does have deficits on exam.  Right leg has been cool.  Pain has been progressive especially in the past week, has been trying to ambulate with the use of crutches but in the past few days not able to get up much at all.   Had no known history of PAD or other ASCVD prior, not taking antiplatelets or anticoagulants prior to this admission.  No history of bleeding issues.  He was admitted for further evaluation.     Assessment & Plan    Assessment and Plan:  Atrial flutter/ fibrillation:  On IV cardizem gtt for rate control.  On IV heparin.  Echocardiogram shows LVEF of 55 to 60% no regional wall abn. LV diastolic parameters are indeterminate .     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 PAD with bilateral SFA occlusions: Patient has chronic PAD now presented with worsening right leg symptoms. Etiology of this possibly atherothrombotic /embolic from underlying PAD v less likely cardioembolic with newly identified Aflutter.  RLE below the knee is cold with no pedal pulses on  the Right, there is numbness in stocking distribution below the ankle on the R side, and strength is 3/5 with dorsiflexion at the ankle, no tissue loss.  CTA with complete occlusion at R popliteal with no runoff to the foot, additional occlusions R SFA origin + prox SFA with reconstitution, and L CFA with reconstitution and 3 vessel runoff on the left.  Vascular surgery Dr. Lenell Antu recommends for transfer to [facility] and initiation of heparin drip.  Serial neurovascular checks of the right lower extremity Adequate pain control. Patient underwent Right CFA endarterectomy with bovine patch angiopasty, RLE thrombectomy, cutdown popliteal with tibial thrombectomy of the AT, PT, peroneal arteries and 4 compartment fasciotomies 11/06/2023 by Dr. Chestine Spore  Pain control.   Lactic acidosis:  Recheck levels in am.  Continue with IV fluids.   Rhabdomyolysis:  Elevated CKD levels. Continue with IV fluids.    Acute myocardial injury: No history of chest pain.  High-sensitivity troponin 22 -> 26.   EKG with a flutter with no acute ischemic changes. Likely demand event  in the setting of arrhythmia, management directed at underlying A-fib above.   AKI on stage  ckd 3a  Improving with IV fluids.  Holding chlorthalidone and losartan.    Hypertension BP parameters have improved.    Type 2 DM CBG (last 3)  Recent Labs    11/06/23 2139 11/07/23 0630 11/07/23 1222  GLUCAP 213* 153* 145*   Resume SSI.    GERD Continue with PPI.    Hypercalcemia Resolved with IV fluids.    Estimated body mass index is 24.33 kg/m as calculated from the following:   Height as of this encounter: 5\' 8"  (1.727 m).   Weight as of this encounter: 72.6 kg.  Code Status: full code.  DVT Prophylaxis:     Level of Care: Level of care: Progressive Family Communication: family at bedside.   Disposition Plan:     Remains inpatient appropriate:  atrial fib on IV cardizem.   Procedures:  None.   Consultants:    Vascular surgery.   Antimicrobials:   Anti-infectives (From admission, onward)    Start     Dose/Rate Route Frequency Ordered Stop   11/06/23 0600  penicillin g benzathine (BICILLIN LA) 1200000 UNIT/2ML injection 2.4 Million Units  Status:  Discontinued        2.4 Million Units Intramuscular  Once 11/06/23 0551 11/06/23 0551   11/06/23 0600  cefTRIAXone (ROCEPHIN) injection 500 mg  Status:  Discontinued        500 mg Intramuscular  Once 11/06/23 0551 11/06/23 0551        Medications  Scheduled Meds:  Chlorhexidine Gluconate Cloth  6 each Topical Q0600   diltiazem  60 mg Oral Q6H   insulin aspart  0-6 Units Subcutaneous TID WC   latanoprost  1 drop Both Eyes QHS   mupirocin ointment  1 Application Nasal BID   pantoprazole  40 mg Oral Daily   rosuvastatin  10 mg Oral QHS   sodium chloride flush  3 mL Intravenous Q12H   Continuous Infusions:  sodium chloride 100 mL/hr at 11/06/23 0012   diltiazem (CARDIZEM) infusion 5 mg/hr (11/07/23 0853)   heparin 1,200 Units/hr (11/06/23 1132)   PRN Meds:.acetaminophen, HYDROmorphone (DILAUDID) injection, melatonin, ondansetron (ZOFRAN) IV, oxyCODONE **OR** oxyCODONE, polyethylene glycol    Subjective:   Arlo Hoar was seen and examined today.  No new complaints.   Objective:   Vitals:   11/07/23 1016 11/07/23 1017 11/07/23 1100 11/07/23 1130  BP:  109/61 105/72 102/74  Pulse:  100 93 (!) 105  Resp: 19 (!) 25 20 20   Temp:    98.6 F (37 C)  TempSrc:    Oral  SpO2:  100% 100% 100%  Weight:      Height:        Intake/Output Summary (Last 24 hours) at 11/07/2023 1145 Last data filed at 11/07/2023 1100 Gross per 24 hour  Intake 1093 ml  Output 2260 ml  Net -1167 ml   Filed Weights   11/05/23 1731 11/06/23 0915  Weight: 73.4 kg 72.6 kg     Exam General: Alert and oriented x 3, NAD Cardiovascular: S1 S2 auscultated, no murmurs, irregularly irregular.  Respiratory: Clear to auscultation bilaterally, no wheezing,  rales or rhonchi Gastrointestinal: Soft, nontender, nondistended, + bowel sounds Ext: right lower extremity bandaged.  Neuro: AAOx3, Cr N's II- XII. Strength 5/5 upper and lower extremities bilaterally Skin: No rashes Psych: Normal affect and demeanor, alert and oriented x3    Data Reviewed:  I have personally  reviewed following labs and imaging studies   CBC Lab Results  Component Value Date   WBC 9.4 11/07/2023   RBC 2.99 (L) 11/07/2023   HGB 8.9 (L) 11/07/2023   HCT 26.0 (L) 11/07/2023   MCV 87.0 11/07/2023   MCH 29.8 11/07/2023   PLT 182 11/07/2023   MCHC 34.2 11/07/2023   RDW 13.4 11/07/2023   LYMPHSABS 1.5 11/05/2023   MONOABS 0.7 11/05/2023   EOSABS 0.0 11/05/2023   BASOSABS 0.0 11/05/2023     Last metabolic panel Lab Results  Component Value Date   NA 134 (L) 11/07/2023   K 4.1 11/07/2023   CL 105 11/07/2023   CO2 19 (L) 11/07/2023   BUN 29 (H) 11/07/2023   CREATININE 1.50 (H) 11/07/2023   GLUCOSE 156 (H) 11/07/2023   GFRNONAA 47 (L) 11/07/2023   CALCIUM 9.1 11/07/2023   PHOS 2.7 11/07/2023   PROT 7.0 11/07/2023   ALBUMIN 3.3 (L) 11/07/2023   BILITOT 0.8 11/07/2023   ALKPHOS 48 11/07/2023   AST 183 (H) 11/07/2023   ALT 21 11/07/2023   ANIONGAP 10 11/07/2023    CBG (last 3)  Recent Labs    11/06/23 1259 11/06/23 2139 11/07/23 0630  GLUCAP 112* 213* 153*      Coagulation Profile: Recent Labs  Lab 11/05/23 2202 11/06/23 0411  INR 1.1 1.1     Radiology Studies: ECHOCARDIOGRAM COMPLETE Result Date: 11/06/2023    ECHOCARDIOGRAM REPORT   Patient Name:   ROWLEY GROENEWEG Date of Exam: 11/06/2023 Medical Rec #:  [id number]      Height:       68.0 in Accession #:    [id number]     Weight:       160.0 lb Date of Birth:  04-02-1946      BSA:          1.859 m Patient Age:    77 years       BP:           128/68 mmHg Patient Gender: M              HR:           100 bpm. Exam Location:  Inpatient Procedure: 2D Echo, Cardiac Doppler and Color Doppler  Indications:    Atrial Flutter I48.92  History:        Patient has no prior history of Echocardiogram examinations.  Sonographer:    Dondra Prader RVT RCS Referring Phys: Dolly Rias IMPRESSIONS  1. Left ventricular ejection fraction, by estimation, is 55 to 60%. The left ventricle has normal function. The left ventricle has no regional wall motion abnormalities. Left ventricular diastolic parameters are indeterminate.  2. Right ventricular systolic function is normal. The right ventricular size is normal.  3. The mitral valve is abnormal. Mild mitral valve regurgitation. No evidence of mitral stenosis.  4. Tricuspid valve regurgitation is mild to moderate.  5. The aortic valve is tricuspid. There is mild calcification of the aortic valve. Aortic valve regurgitation is trivial. Aortic valve sclerosis is present, with no evidence of aortic valve stenosis.  6. Aortic dilatation noted. There is mild dilatation of the ascending aorta, measuring 39 mm.  7. The inferior vena cava is normal in size with greater than 50% respiratory variability, suggesting right atrial pressure of 3 mmHg. FINDINGS  Left Ventricle: Left ventricular ejection fraction, by estimation, is 55 to 60%. The left ventricle has normal function. The left ventricle has no regional wall motion abnormalities. The left  ventricular internal cavity size was normal in size. There is  no left ventricular hypertrophy. Left ventricular diastolic parameters are indeterminate. Right Ventricle: The right ventricular size is normal. No increase in right ventricular wall thickness. Right ventricular systolic function is normal. Left Atrium: Left atrial size was normal in size. Right Atrium: Right atrial size was normal in size. Pericardium: Trivial pericardial effusion is present. The pericardial effusion is posterior to the left ventricle. Mitral Valve: The mitral valve is abnormal. There is mild thickening of the mitral valve leaflet(s). Mild mitral valve  regurgitation. No evidence of mitral valve stenosis. Tricuspid Valve: The tricuspid valve is normal in structure. Tricuspid valve regurgitation is mild to moderate. No evidence of tricuspid stenosis. Aortic Valve: The aortic valve is tricuspid. There is mild calcification of the aortic valve. Aortic valve regurgitation is trivial. Aortic valve sclerosis is present, with no evidence of aortic valve stenosis. Aortic valve mean gradient measures 2.3 mmHg. Aortic valve peak gradient measures 4.4 mmHg. Aortic valve area, by VTI measures 3.26 cm. Pulmonic Valve: The pulmonic valve was normal in structure. Pulmonic valve regurgitation is not visualized. No evidence of pulmonic stenosis. Aorta: Aortic dilatation noted. There is mild dilatation of the ascending aorta, measuring 39 mm. Venous: The inferior vena cava is normal in size with greater than 50% respiratory variability, suggesting right atrial pressure of 3 mmHg. IAS/Shunts: No atrial level shunt detected by color flow Doppler.  LEFT VENTRICLE PLAX 2D LVIDd:         4.50 cm   Diastology LVIDs:         3.10 cm   LV e' medial:    8.59 cm/s LV PW:         1.10 cm   LV E/e' medial:  12.1 LV IVS:        0.80 cm   LV e' lateral:   14.90 cm/s LVOT diam:     2.00 cm   LV E/e' lateral: 7.0 LV SV:         49 LV SV Index:   26 LVOT Area:     3.14 cm  RIGHT VENTRICLE             IVC RV Basal diam:  2.90 cm     IVC diam: 1.00 cm RV Mid diam:    2.30 cm RV S prime:     14.80 cm/s TAPSE (M-mode): 1.6 cm LEFT ATRIUM             Index        RIGHT ATRIUM           Index LA diam:        3.60 cm 1.94 cm/m   RA Area:     12.20 cm LA Vol (A2C):   52.6 ml 28.30 ml/m  RA Volume:   25.30 ml  13.61 ml/m LA Vol (A4C):   43.9 ml 23.59 ml/m LA Biplane Vol: 51.4 ml 27.65 ml/m  AORTIC VALVE                    PULMONIC VALVE AV Area (Vmax):    3.00 cm     PV Vmax:       0.84 m/s AV Area (Vmean):   2.82 cm     PV Peak grad:  2.8 mmHg AV Area (VTI):     3.26 cm AV Vmax:           104.33  cm/s AV Vmean:  69.300 cm/s AV VTI:            0.149 m AV Peak Grad:      4.4 mmHg AV Mean Grad:      2.3 mmHg LVOT Vmax:         99.75 cm/s LVOT Vmean:        62.100 cm/s LVOT VTI:          0.155 m LVOT/AV VTI ratio: 1.04  AORTA Ao Root diam: 3.10 cm Ao Asc diam:  3.90 cm MITRAL VALVE                TRICUSPID VALVE MV Area (PHT): 5.70 cm     TR Peak grad:   37.5 mmHg MV Decel Time: 133 msec     TR Vmax:        306.00 cm/s MV E velocity: 103.70 cm/s MV A velocity: 40.20 cm/s   SHUNTS MV E/A ratio:  2.58         Systemic VTI:  0.16 m                             Systemic Diam: 2.00 cm Charlton Haws MD Electronically signed by Charlton Haws MD Signature Date/Time: 11/06/2023/3:53:23 PM    Final    DG Chest Port 1 View Result Date: 11/05/2023 CLINICAL DATA:  AFib. EXAM: PORTABLE CHEST 1 VIEW COMPARISON:  08/11/2005. FINDINGS: The heart size and mediastinal contours are within normal limits. There is atherosclerotic calcification of the aorta. No consolidation, effusion, or pneumothorax is seen. No acute osseous abnormality. IMPRESSION: No active disease. Electronically Signed   By: Thornell Sartorius M.D.   On: 11/05/2023 21:58   CT Angio Aortobifemoral W and/or Wo Contrast Result Date: 11/05/2023 CLINICAL DATA:  Claudication or leg ischemia Pt reports he has been having pain in his right foot for about 3 weeks and has had a negative [place] and xray and was given an MRI yesterday and was told a nerve was pressing on something EXAM: CT ANGIOGRAPHY OF ABDOMINAL AORTA WITH ILIOFEMORAL RUNOFF TECHNIQUE: Multidetector CT imaging of the abdomen, pelvis and lower extremities was performed using the standard protocol during bolus administration of intravenous contrast. Multiplanar CT image reconstructions and MIPs were obtained to evaluate the vascular anatomy. RADIATION DOSE REDUCTION: This exam was performed according to the departmental dose-optimization program which includes automated exposure control, adjustment of the  mA and/or kV according to patient size and/or use of iterative reconstruction technique. CONTRAST:  OMNIPAQUE IOHEXOL 350 MG/ML SOLN COMPARISON:  None Available. FINDINGS: VASCULAR Aorta: Severe atherosclerotic plaque. Normal caliber aorta without aneurysm, dissection, vasculitis or significant stenosis. Celiac: Patent without evidence of aneurysm, dissection, vasculitis or significant stenosis. SMA: Patent without evidence of aneurysm, dissection, vasculitis or significant stenosis. Renals: Both renal arteries are patent without evidence of aneurysm, dissection, vasculitis, fibromuscular dysplasia or significant stenosis. IMA: Patent without evidence of aneurysm, dissection, vasculitis or significant stenosis. RIGHT Lower Extremity Severe atherosclerotic plaque. Short segment severe narrowing of the left external iliac artery (4:103). 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 A 2 cm almost complete occlusion of the origin of the RIGHT superficial femoral artery. Re-opacification distally. A 4 cm almost complete occlusion of the RIGHT proximal superficial femoral artery. Reopacification distally. LEFT Lower Extremity Severe atherosclerotic plaque. A 3 cm almost complete occlusion of the LEFT common femoral artery. Re-opacification distally with three-vessel runoff. VEINS: No obvious  venous abnormality within the limitations of this arterial phase study. Review of the MIP images confirms the above findings. NON-VASCULAR Lower chest: No acute abnormality. Hepatobiliary: No focal liver abnormality. No gallstones, gallbladder wall thickening, or pericholecystic fluid. No biliary dilatation. Pancreas: No focal lesion. Normal pancreatic contour. No surrounding inflammatory changes. No main pancreatic ductal dilatation. Spleen: Normal in size without focal abnormality. Adrenals/Urinary Tract: No adrenal nodule bilaterally. Bilateral kidneys enhance symmetrically.  Fluid density lesions of the left kidney likely represent simple renal cysts. Simple renal cysts, in the absence of clinically indicated signs/symptoms, require no independent follow-up. No hydronephrosis. No hydroureter. The urinary bladder is unremarkable. Stomach/Bowel: Stomach is within normal limits. No evidence of bowel wall thickening or dilatation. Appendix appears normal. Lymphatic: No lymphadenopathy. Reproductive: Prostate is unremarkable. Other: No intraperitoneal free fluid. No intraperitoneal free gas. No organized fluid collection. Musculoskeletal: No abdominal wall hernia or abnormality. No suspicious lytic or blastic osseous lesions. No acute displaced fracture. Multilevel degenerative changes of the spine. No evidence of fracture, dislocation, or joint effusion of either lower extremity. No evidence of severe arthropathy and no aggressive appearing focal bone abnormality a either lower extremities. Soft tissues are unremarkable. IMPRESSION: VASCULAR 1.  Aortic Atherosclerosis (ICD10-I70.0)-severe. 2. 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 3. A 2 cm almost complete occlusion of the origin of the RIGHT superficial femoral artery. Re-opacification distally. 4. A 4 cm almost complete occlusion of the RIGHT proximal superficial femoral artery. Reopacification distally. 5. A 3 cm almost complete occlusion of the LEFT common femoral artery. Re-opacification distally with three-vessel runoff. NON-VASCULAR 1. No acute intra-abdominal or intrapelvic abnormality. 2. No acute abnormality of the bilateral lower extremities. These results were called by telephone at the time of interpretation on 11/05/2023 at 9:00 pm to provider Ariel PA, who verbally acknowledged these results. Electronically Signed   By: Tish Frederickson M.D.   On: 11/05/2023 21:07       Kathlen Mody M.D. [facility] 11/07/2023, 11:45 AM  Available via Epic secure chat  7am-7pm After 7 pm, please refer to night coverage provider listed on amion.

## 2023-11-07 NOTE — Progress Notes (Signed)
Patient's BP reading was 78/43 from his left hand, patient was asymptomatic for hypotension, Cardizem drip paused for few seconds and checked BP on his right hand, BP was 109/61, so Cardizem restarted with rate, will continue to monitor.   11/07/23 1000  Vitals  BP (!) 78/43  MAP (mmHg) (!) 54  BP Location Left Arm  BP Method Automatic  Patient Position (if appropriate) Lying  Pulse Rate (!) 113  Pulse Rate Source Monitor  ECG Heart Rate (!) 108  Resp 20  MEWS COLOR  MEWS Score Color Yellow  Oxygen Therapy  SpO2 100 %  O2 Device Room Air  MEWS Score  MEWS Temp 0  MEWS Systolic 2  MEWS Pulse 1  MEWS RR 0  MEWS LOC 0  MEWS Score 3

## 2023-11-08 DIAGNOSIS — I70209 Unspecified atherosclerosis of native arteries of extremities, unspecified extremity: Secondary | ICD-10-CM | POA: Diagnosis not present

## 2023-11-08 LAB — CBC
HCT: 26.9 % — ABNORMAL LOW (ref 39.0–52.0)
Hemoglobin: 9.1 g/dL — ABNORMAL LOW (ref 13.0–17.0)
MCH: 29.4 pg (ref 26.0–34.0)
MCHC: 33.8 g/dL (ref 30.0–36.0)
MCV: 87.1 fL (ref 80.0–100.0)
Platelets: 168 10*3/uL (ref 150–400)
RBC: 3.09 MIL/uL — ABNORMAL LOW (ref 4.22–5.81)
RDW: 13.3 % (ref 11.5–15.5)
WBC: 8.6 10*3/uL (ref 4.0–10.5)
nRBC: 0 % (ref 0.0–0.2)

## 2023-11-08 LAB — GLUCOSE, CAPILLARY
Glucose-Capillary: 136 mg/dL — ABNORMAL HIGH (ref 70–99)
Glucose-Capillary: 137 mg/dL — ABNORMAL HIGH (ref 70–99)
Glucose-Capillary: 153 mg/dL — ABNORMAL HIGH (ref 70–99)
Glucose-Capillary: 168 mg/dL — ABNORMAL HIGH (ref 70–99)

## 2023-11-08 LAB — BASIC METABOLIC PANEL
Anion gap: 13 (ref 5–15)
BUN: 23 mg/dL (ref 8–23)
CO2: 21 mmol/L — ABNORMAL LOW (ref 22–32)
Calcium: 9.5 mg/dL (ref 8.9–10.3)
Chloride: 103 mmol/L (ref 98–111)
Creatinine, Ser: 1.48 mg/dL — ABNORMAL HIGH (ref 0.61–1.24)
GFR, Estimated: 48 mL/min — ABNORMAL LOW (ref >60)
Glucose, Bld: 151 mg/dL — ABNORMAL HIGH (ref 70–99)
Potassium: 3.6 mmol/L (ref 3.5–5.1)
Sodium: 137 mmol/L (ref 135–145)

## 2023-11-08 LAB — HEPARIN LEVEL (UNFRACTIONATED)
Heparin Unfractionated: 0.53 [IU]/mL (ref 0.30–0.70)
Heparin Unfractionated: 0.47 [IU]/mL (ref 0.30–0.70)

## 2023-11-08 LAB — CK: Total CK: 9430 U/L — ABNORMAL HIGH (ref 49–397)

## 2023-11-08 LAB — LACTIC ACID, PLASMA: Lactic Acid, Venous: 1.1 mmol/L (ref 0.5–1.9)

## 2023-11-08 MED ORDER — METOPROLOL TARTRATE 25 MG PO TABS
25.0000 mg | ORAL_TABLET | Freq: Two times a day (BID) | ORAL | Status: DC
  Administered 2023-11-08 – 2023-11-16 (×17): 25 mg via ORAL
  Filled 2023-11-08 (×17): qty 1

## 2023-11-08 MED ORDER — OXYCODONE HCL 5 MG PO TABS
5.0000 mg | ORAL_TABLET | ORAL | Status: DC | PRN
  Administered 2023-11-10 – 2023-11-16 (×6): 5 mg via ORAL
  Filled 2023-11-08 (×5): qty 1

## 2023-11-08 MED ORDER — OXYCODONE HCL 5 MG PO TABS
10.0000 mg | ORAL_TABLET | ORAL | Status: DC | PRN
  Administered 2023-11-09 – 2023-11-17 (×32): 10 mg via ORAL
  Filled 2023-11-08 (×34): qty 2

## 2023-11-08 MED ORDER — KETOROLAC TROMETHAMINE 15 MG/ML IJ SOLN
15.0000 mg | Freq: Once | INTRAMUSCULAR | Status: AC
  Administered 2023-11-08: 15 mg via INTRAVENOUS
  Filled 2023-11-08: qty 1

## 2023-11-08 NOTE — Progress Notes (Signed)
PHARMACY - ANTICOAGULATION CONSULT NOTE  Pharmacy Consult for heparin Indication:  arterial occlusion  Labs: Recent Labs    11/05/23 1748 11/05/23 1834 11/05/23 2020 11/05/23 2202 11/05/23 2355 11/06/23 0411 11/06/23 1207 11/06/23 2204 11/07/23 0345 11/07/23 1539 11/08/23 0259  HGB  --   --   --   --   --  11.9* 8.8*  --  8.9*  --  9.1*  HCT  --   --   --   --   --  35.6* 26.0*  --  26.0*  --  26.9*  PLT  --   --   --   --   --  229  --   --  182  --  168  APTT  --   --   --   --  113*  --   --   --   --   --   --   LABPROT  --   --   --  14.2  --  14.5  --   --   --   --   --   INR  --   --   --  1.1  --  1.1  --   --   --   --   --   HEPARINUNFRC   < >  --   --   --   --  0.78*  --  >1.10*  --  0.85* 0.53  CREATININE  --   --   --   --   --  1.59*  --   --  1.50*  --  1.48*  CKTOTAL  --   --   --   --  5,733*  --   --   --  12,526*  --  9,430*  TROPONINIHS  --  22* 26*  --   --   --   --   --   --   --   --    < > = values in this interval not displayed.   Assessment/Plan:  78yo male therapeutic on heparin after rate change. Will continue infusion at current rate of 1050 units/hr and confirm stable with additional level.  Vernard Gambles, PharmD, BCPS 11/08/2023 4:38 AM

## 2023-11-08 NOTE — Plan of Care (Signed)
  Problem: Metabolic: Goal: Ability to maintain appropriate glucose levels will improve Outcome: Progressing   Problem: Nutritional: Goal: Maintenance of adequate nutrition will improve Outcome: Progressing Goal: Progress toward achieving an optimal weight will improve Outcome: Progressing   Problem: Education: Goal: Knowledge of General Education information will improve Description: Including pain rating scale, medication(s)/side effects and non-pharmacologic comfort measures Outcome: Progressing   Problem: Clinical Measurements: Goal: Will remain free from infection Outcome: Progressing   Problem: Activity: Goal: Risk for activity intolerance will decrease Outcome: Progressing   Problem: Elimination: Goal: Will not experience complications related to bowel motility Outcome: Progressing Goal: Will not experience complications related to urinary retention Outcome: Progressing

## 2023-11-08 NOTE — Progress Notes (Addendum)
Occupational Therapy Treatment Patient Details Name: Manuel Winters MRN: [id number] DOB: 1946-05-03 Today's Date: 11/08/2023   History of present illness Pt is a 78 y/o male presenting with persistent right lower extremity pain in setting of limb ischemia. Underwent R common femoral endarterectomy, thrombectomy via femoral approach, popliteal cutdown w/ tibial thrombectomy and 4 compartment fasciotomies on 1/17. PMH: HTN, DM2, HLD, CKD   OT comments  Pt. Seen for skilled OT treatment session.  Pt. Remains limited with activity secondary to fluctuations of HR.  Bed mobility with MIN/MOD A.  Emphasized rest breaks and PLB strategies for pt. After and during activities.  Session eob today with introduction/demo/review of A/E for LB ADLS.  Pt. And spouse receptive to education.  A/E handout provided so they can review with their daughter and plan for purchase.  Cont. With acute OT POC.    HR:  supine to sit 116-120 Sitting: 105-116 changing often with or without activity while seated Supine end of session: 88-92        If plan is discharge home, recommend the following:  A lot of help with walking and/or transfers;A lot of help with bathing/dressing/bathroom;Assistance with cooking/housework;Assist for transportation;Help with stairs or ramp for entrance   Equipment Recommendations  Other (comment);BSC/3in1    Recommendations for Other Services      Precautions / Restrictions Precautions Precautions: Fall;Other (comment) Precaution Comments: watch HR Restrictions Weight Bearing Restrictions Per Provider Order: No       Mobility Bed Mobility Overal bed mobility: Needs Assistance Bed Mobility: Supine to Sit, Sit to Supine     Supine to sit: Min assist Sit to supine: Mod assist   General bed mobility comments: no rails, hob lowered to simulate home env. pt. with good UB strength and able to push into long sitting, educated on guiding each LE out, pt. able some light support when  bringing trunk upright.  back to bed required mod a for guiding BLEs into bed, attempting to guide BLEs together pt. also trying to assist and showed visible grimacing during movement of his RLE.  once in bed and on pillow he felt better.    Transfers                         Balance                                           ADL either performed or assessed with clinical judgement   ADL Overall ADL's : Needs assistance/impaired             Lower Body Bathing: Sitting/lateral leans;With adaptive equipment Lower Body Bathing Details (indicate cue type and reason): introduced and educated on use fo LH sponge     Lower Body Dressing: Moderate assistance;Sitting/lateral leans;Cueing for sequencing;With adaptive equipment Lower Body Dressing Details (indicate cue type and reason): introduced and demonstrated A/E for LB dressing (sock aide and reacher) demonstrated on pt. as HR remains unstable with fluctations               General ADL Comments: limited to EOB secondary to fluctuating HR    Extremity/Trunk Assessment              Vision       Perception     Praxis      Cognition Arousal: Alert Behavior During Therapy: [facility] for  tasks assessed/performed Overall Cognitive Status: Within Functional Limits for tasks assessed                                          Exercises      Shoulder Instructions       General Comments  PLB, rest breaks reviewed with pt. And spouse.  A/E handout provided with examples of where/how to purchase    Pertinent Vitals/ Pain       Pain Assessment Pain Assessment: 0-10 Pain Score: 8  Pain Location: RLE w/ movement Pain Descriptors / Indicators: Grimacing, Guarding, Sore Pain Intervention(s): Limited activity within patient's tolerance, Monitored during session, Repositioned  Home Living                                          Prior Functioning/Environment               Frequency  Min 1X/week        Progress Toward Goals  OT Goals(current goals can now be found in the care plan section)  Progress towards OT goals: Progressing toward goals     Plan      Co-evaluation                 AM-PAC OT "6 Clicks" Daily Activity     Outcome Measure   Help from another person eating meals?: None Help from another person taking care of personal grooming?: A Little Help from another person toileting, which includes using toliet, bedpan, or urinal?: A Lot Help from another person bathing (including washing, rinsing, drying)?: A Little Help from another person to put on and taking off regular upper body clothing?: A Little Help from another person to put on and taking off regular lower body clothing?: A Lot 6 Click Score: 17    End of Session Equipment Utilized During Treatment: Other (comment) (A/E)  OT Visit Diagnosis: Unsteadiness on feet (R26.81);Other abnormalities of gait and mobility (R26.89);Muscle weakness (generalized) (M62.81)   Activity Tolerance Treatment limited secondary to medical complications (Comment) (session EOB secondary to fluctuations with HR)   Patient Left  In bed, call bell in reach, bed alarm on, family present   Nurse Communication Other (comment) (rn states ok to work with pt. wants HR to stay as stable as possible. tx. kept eob. end of session reviewed with RN fluctuations HR from 105-120 as highest recording hoovered 116 but would drop to 105 or high 90s, in bed end of session low 90s)        Time: 1610-9604 OT Time Calculation (min): 27 min  Charges: OT General Charges $OT Visit: 1 Visit OT Treatments $Self Care/Home Management : 23-37 mins  Boneta Lucks, COTA/L Acute Rehabilitation [phone]   Alessandra Bevels Lorraine-COTA/L 11/08/2023, 10:05 AM

## 2023-11-08 NOTE — Progress Notes (Addendum)
  Progress Note    11/08/2023 6:50 AM 2 Days Post-Op  Subjective:  says his foot feels warm.  Thinks he can move his toes a little better.  Dressing removed as he said it felt tight.  Foot felt better when removed.   Tm 99.1 now afebrile  Vitals:   11/07/23 2334 11/08/23 0406  BP: 110/64 121/74  Pulse: 63 97  Resp: 20 20  Temp: 99.1 F (37.3 C) 98.1 F (36.7 C)  SpO2: 99% 100%    Physical Exam: General:  no distress Cardiac:  irregular Lungs:  non labored Incisions:  right groin incision is clean Right medial wound   Right lateral incision    Extremities:  brisk doppler flow right DP/PT; sensation present in toes.  Still with dense motor defect but appears to be moving a little better  CBC    Component Value Date/Time   WBC 8.6 11/08/2023 0259   RBC 3.09 (L) 11/08/2023 0259   HGB 9.1 (L) 11/08/2023 0259   HCT 26.9 (L) 11/08/2023 0259   PLT 168 11/08/2023 0259   MCV 87.1 11/08/2023 0259   MCH 29.4 11/08/2023 0259   MCHC 33.8 11/08/2023 0259   RDW 13.3 11/08/2023 0259   LYMPHSABS 1.5 11/05/2023 1748   MONOABS 0.7 11/05/2023 1748   EOSABS 0.0 11/05/2023 1748   BASOSABS 0.0 11/05/2023 1748    BMET    Component Value Date/Time   NA 137 11/08/2023 0259   K 3.6 11/08/2023 0259   CL 103 11/08/2023 0259   CO2 21 (L) 11/08/2023 0259   GLUCOSE 151 (H) 11/08/2023 0259   BUN 23 11/08/2023 0259   CREATININE 1.48 (H) 11/08/2023 0259   CALCIUM 9.5 11/08/2023 0259   GFRNONAA 48 (L) 11/08/2023 0259    INR    Component Value Date/Time   INR 1.1 11/06/2023 0411     Intake/Output Summary (Last 24 hours) at 11/08/2023 0650 Last data filed at 11/08/2023 0600 Gross per 24 hour  Intake 26067.64 ml  Output 1950 ml  Net 24117.64 ml      Assessment/Plan:  78 y.o. male is s/p:  Right CFA endarterectomy with bovine patch angiopasty, RLE thrombectomy, cutdown popliteal with tibial thrombectomy of the AT, PT, peroneal arteries and 4 compartment fasciotomies  11/06/2023 by Dr. Chestine Spore   2 Days Post-Op   -pt with brisk doppler flow right DP/PT and right foot is warm.  Still with dense motor defect but has sensation in the toes and moving foot a little more than yesterday. -dressing changed and muscle appears healthy. -continue to mobilize out of bed and working with therapy -acute blood loss anemia-hgb stable -DVT prophylaxis:  heparin gtt -afib management per primary team   Doreatha Massed, PA-C [facility] [phone] 11/08/2023 6:50 AM  I have seen and evaluated the patient. I agree with the PA note as documented above.  78 year old male underwent right femoral endarterectomy with right lower extremity thrombectomy including popliteal cutdown with tibial thrombectomy and 4 compartment fasciotomies on Friday.  Brisk Doppler signal in the right foot.  Continue heparin.  Still has motor deficit with limited ankle flexion consistent with his initial presentation.  Keep n.p.o. after midnight and will see if we can close his fasciotomies tomorrow in the OR and will examine at bedside in the morning first.  Cephus Shelling, MD [facility]: [phone]

## 2023-11-08 NOTE — Plan of Care (Signed)
  Problem: Education: Goal: Ability to describe self-care measures that may prevent or decrease complications (Diabetes Survival Skills Education) will improve Outcome: Progressing Goal: Individualized Educational Video(s) Outcome: Progressing   Problem: Coping: Goal: Ability to adjust to condition or change in health will improve Outcome: Progressing   Problem: Skin Integrity: Goal: Risk for impaired skin integrity will decrease Outcome: Progressing   Problem: Clinical Measurements: Goal: Ability to maintain clinical measurements within normal limits will improve Outcome: Progressing Goal: Will remain free from infection Outcome: Progressing Goal: Diagnostic test results will improve Outcome: Progressing Goal: Respiratory complications will improve Outcome: Progressing Goal: Cardiovascular complication will be avoided Outcome: Progressing

## 2023-11-08 NOTE — Progress Notes (Signed)
Mobility Specialist Progress Note:    11/08/23 1216  Mobility  Activity Transferred from bed to chair  Level of Assistance Moderate assist, patient does 50-74%  Assistive Device Front wheel walker  Distance Ambulated (ft) 5 ft  Activity Response Tolerated well  Mobility Referral Yes  Mobility visit 1 Mobility  Mobility Specialist Start Time (ACUTE ONLY) 1200  Mobility Specialist Stop Time (ACUTE ONLY) 1215  Mobility Specialist Time Calculation (min) (ACUTE ONLY) 15 min   Pt received in bed, agreeable to transfer B>C d/t MD orders. RN in room, assisting. Required ModA to stand, c/o RLE pain. Tolerated well, max HR 145 bpm. Pt sitting in chair comfortably, call bell in reach, alarm on, all needs met.   Feliciana Rossetti Mobility Specialist Please contact via Special educational needs teacher or  Rehab office at [phone]

## 2023-11-08 NOTE — Progress Notes (Signed)
Patient's respiration and HR goes up with body movements and with pain, pain medication given as per patient's need. MD aware, was at bedside.   11/08/23 1250  Vitals  Pulse Rate (!) 120  Pulse Rate Source Monitor  ECG Heart Rate (!) 123  Resp (!) 25  MEWS COLOR  MEWS Score Color Yellow  Oxygen Therapy  SpO2 94 %  O2 Device Room Air  MEWS Score  MEWS Temp 0  MEWS Systolic 0  MEWS Pulse 2  MEWS RR 1  MEWS LOC 0  MEWS Score 3

## 2023-11-08 NOTE — Consult Note (Addendum)
Cardiology Consultation   Patient ID: QUAVIS BANDUCCI MRN: [id number]; DOB: [birth year]  Admit date: 11/05/2023 Date of Consult: 11/08/2023  PCP:  Oletha Blend, MD   Manuel Winters Cardiologist:  None  - New this admission   Patient Profile:   Manuel Winters is a 78 y.o. male with a hx of hypertension, type 2 DM, HLD, CKD stage III who is being seen 11/08/2023 for the evaluation of atrial flutter at the request of Dr. Blake Divine.  History of Present Illness:   Manuel Winters is a 78 year old male with above medical history. Per chart review patient does not have any past cardiac history and does not follow with a cardiologist.   Patient presented to the ED on 1/16 complaining of pain in his right foot that had been ongoing for 3 weeks-1 month. In the ED, patient was found to have right popliteal occlusion with no runoff to the foot. His case was discussed with Vascular surgery who recommended starting heparin drip and admission to [facility]. Patient was also noted to be in atrial flutter with RVR in the ED, and he was started on dilt drip with adequate rate control. He was later transitioned to PO diltiazem 30 mg q6hrs.   On 1/17, patient was taken to the OR with Dr. Chestine Spore. Underwent right common femoral endarterectomy with bovine patch angioplasty, right lower extremity thrombectomy, right popliteal cutdown with tibial thrombectomy, and right lower extremity 4 compartment fasciotomies. Echocardiogram on 1/17 showed EF 55-60%, no regional wall motion abnormalities, normal RV function, mild MR, mild dilatation of the ascending aorta measuring 39 mm. At some point that evening/overnight, he was transitioned back to IV diltiazem.   On 1/18, patient had an episode of tachycardia with HR elevated to the 120s, BP 94/67. He was given IV fluid bolus with improvement in HR. Later, patient was transitioned back to PO diltaizem. When he was working with OT, his HR again became elevated to the 140s and  he was restarted on cardizem gtt.   Today, patient is on cardizem 60 mg every 6 hours. Per telemetry, HR is ranges from 100-120 BPM. BP 125/86. He denies chest pain, palpitations, shortness of breath. Main complaint is R leg pain after his procedure. He denies syncope, near syncope, or dizziness.     Past Medical History:  Diagnosis Date   Hypertension     History reviewed. No pertinent surgical history.   Home Medications:  Prior to Admission medications   Medication Sig Start Date End Date Taking? Authorizing Provider  chlorthalidone (HYGROTON) 25 MG tablet Take 25 mg by mouth every morning.   Yes [provider]  cholecalciferol (VITAMIN D3) 25 MCG (1000 UNIT) tablet Take 1,000 Units by mouth daily.   Yes [provider]  felodipine (PLENDIL) 10 MG 24 hr tablet Take 10 mg by mouth daily.   Yes [provider]  latanoprost (XALATAN) 0.005 % ophthalmic solution Place 1 drop into both eyes at bedtime.   Yes [provider]  losartan (COZAAR) 100 MG tablet Take 100 mg by mouth daily.   Yes [provider]  methylPREDNISolone (MEDROL DOSEPAK) 4 MG TBPK tablet Take 4 mg by mouth as directed. 11/03/23  Yes [provider]  omeprazole (PRILOSEC) 40 MG capsule Take 40 mg by mouth daily.   Yes [provider]  rosuvastatin (CRESTOR) 10 MG tablet Take 10 mg by mouth at bedtime.   Yes [provider]  traMADol (ULTRAM) 50 MG tablet  Take 50 mg by mouth every 6 (six) hours as needed for moderate pain (pain score 4-6). 11/03/23  Yes [provider]  tiZANidine (ZANAFLEX) 4 MG tablet Take 4 mg by mouth 3 (three) times daily. 10/28/23   [provider]    Inpatient Medications: Scheduled Meds:  Chlorhexidine Gluconate Cloth  6 each Topical Q0600   diltiazem  60 mg Oral Q6H   insulin aspart  0-6 Units Subcutaneous TID WC   latanoprost  1 drop Both Eyes QHS   mupirocin ointment  1 Application Nasal BID    pantoprazole  40 mg Oral Daily   sodium chloride flush  3 mL Intravenous Q12H   Continuous Infusions:  sodium chloride 100 mL/hr at 11/06/23 0012   heparin 1,050 Units/hr (11/07/23 1713)   PRN Meds: acetaminophen, HYDROmorphone (DILAUDID) injection, melatonin, ondansetron (ZOFRAN) IV, oxyCODONE **OR** oxyCODONE, polyethylene glycol  Allergies:   No Known Allergies  Social History:   Social History   Socioeconomic History   Marital status: Married    Spouse name: Not on file   Number of children: Not on file   Years of education: Not on file   Highest education level: Not on file  Occupational History   Not on file  Tobacco Use   Smoking status: Never   Smokeless tobacco: Never  Vaping Use   Vaping status: Never Used  Substance and Sexual Activity   Alcohol use: Yes    Comment: occasonal   Drug use: Never   Sexual activity: Not Currently  Other Topics Concern   Not on file  Social History Narrative   Not on file   Social Drivers of Health   Financial Resource Strain: Not on file  Food Insecurity: No Food Insecurity (11/07/2023)   Hunger Vital Sign    Worried About Running Out of Food in the Last Year: Never true    Ran Out of Food in the Last Year: Never true  Transportation Needs: No Transportation Needs (11/07/2023)   PRAPARE - Administrator, Civil Service (Medical): No    Lack of Transportation (Non-Medical): No  Physical Activity: Not on file  Stress: Not on file  Social Connections: Unknown (11/07/2023)   Social Connection and Isolation Panel [NHANES]    Frequency of Communication with Friends and Family: Three times a week    Frequency of Social Gatherings with Friends and Family: Three times a week    Attends Religious Services: Not on file    Active Member of Clubs or Organizations: Not on file    Attends Club or Organization Meetings: Not on file    Marital Status: Married  Intimate Partner Violence: Not At Risk (11/07/2023)   Humiliation,  Afraid, Rape, and Kick questionnaire    Fear of Current or Ex-Partner: No    Emotionally Abused: No    Physically Abused: No    Sexually Abused: No    Family History:   History reviewed. No pertinent family history.   ROS:  Please see the history of present illness.   All other ROS reviewed and negative.     Physical Exam/Data:   Vitals:   11/08/23 1157 11/08/23 1200 11/08/23 1202 11/08/23 1206  BP: 125/86 125/86 125/86   Pulse: (!) 120 (!) 110  (!) 111  Resp: (!) 28 (!) 29  19  Temp: 98.6 F (37 C)     TempSrc: Oral     SpO2: 100% 100%  97%  Weight:      Height:  Intake/Output Summary (Last 24 hours) at 11/08/2023 1323 Last data filed at 11/08/2023 1200 Gross per 24 hour  Intake 2027.64 ml  Output 1550 ml  Net 477.64 ml      11/06/2023    9:15 AM 11/05/2023    5:31 PM 10/09/2023    9:35 AM  Last 3 Weights  Weight (lbs) 160 lb 161 lb 13.1 oz 162 lb  Weight (kg) 72.576 kg 73.4 kg 73.483 kg     Body mass index is 24.33 kg/m.  General:  Well nourished, well developed, in no acute distress. Sitting upright in the armchair   HEENT: normal Neck: no JVD Vascular: Radial pulses 2+ bilaterally Cardiac:  normal S1, S2, irregular rate and rhythm  Lungs:  clear to auscultation bilaterally, no wheezing, rhonchi or rales  Abd: soft, nontender  Ext: no edema in bLE  Skin: warm and dry  Neuro:   no focal abnormalities noted Psych:  Normal affect   EKG:  The EKG was personally reviewed and demonstrates:  EKG from 1/16 showed atrial flutter with variable AV block, HR 105 BPM Telemetry:  Telemetry was personally reviewed and demonstrates:  atrial flutter with HR 100-120 BPM   Relevant CV Studies:  Echocardiogram 11/06/23  1. Left ventricular ejection fraction, by estimation, is 55 to 60%. The  left ventricle has normal function. The left ventricle has no regional  wall motion abnormalities. Left ventricular diastolic parameters are  indeterminate.   2. Right  ventricular systolic function is normal. The right ventricular  size is normal.   3. The mitral valve is abnormal. Mild mitral valve regurgitation. No  evidence of mitral stenosis.   4. Tricuspid valve regurgitation is mild to moderate.   5. The aortic valve is tricuspid. There is mild calcification of the  aortic valve. Aortic valve regurgitation is trivial. Aortic valve  sclerosis is present, with no evidence of aortic valve stenosis.   6. Aortic dilatation noted. There is mild dilatation of the ascending  aorta, measuring 39 mm.   7. The inferior vena cava is normal in size with greater than 50%  respiratory variability, suggesting right atrial pressure of 3 mmHg.    Laboratory Data:  High Sensitivity Troponin:   Recent Labs  Lab 11/05/23 1834 11/05/23 2020  TROPONINIHS 22* 26*     Chemistry Recent Labs  Lab 11/05/23 1834 11/06/23 0411 11/06/23 1207 11/07/23 0345 11/08/23 0259  NA  --  135 140 134* 137  K  --  4.0 4.0 4.1 3.6  CL  --  108  --  105 103  CO2  --  19*  --  19* 21*  GLUCOSE  --  134*  --  156* 151*  BUN  --  35*  --  29* 23  CREATININE  --  1.59*  --  1.50* 1.48*  CALCIUM  --  9.5  --  9.1 9.5  MG 1.8 1.8  --  1.7  --   GFRNONAA  --  44*  --  47* 48*  ANIONGAP  --  8  --  10 13    Recent Labs  Lab 11/05/23 2135 11/07/23 0345  PROT 8.3* 7.0  ALBUMIN 3.7 3.3*  AST 114* 183*  ALT 19 21  ALKPHOS 60 48  BILITOT 0.6 0.8   Lipids  Recent Labs  Lab 11/06/23 0411  CHOL 90  TRIG 50  HDL 40*  LDLCALC 40  CHOLHDL 2.3    Hematology Recent Labs  Lab 11/06/23 0411 11/06/23  1207 11/07/23 0345 11/08/23 0259  WBC 9.1  --  9.4 8.6  RBC 4.03*  --  2.99* 3.09*  HGB 11.9* 8.8* 8.9* 9.1*  HCT 35.6* 26.0* 26.0* 26.9*  MCV 88.3  --  87.0 87.1  MCH 29.5  --  29.8 29.4  MCHC 33.4  --  34.2 33.8  RDW 13.2  --  13.4 13.3  PLT 229  --  182 168   Thyroid  Recent Labs  Lab 11/06/23 0411  TSH 1.150    BNPNo results for input(s): "BNP", "PROBNP" in  the last 168 hours.  DDimer No results for input(s): "DDIMER" in the last 168 hours.   Radiology/Studies:  ECHOCARDIOGRAM COMPLETE Result Date: 11/06/2023    ECHOCARDIOGRAM REPORT   Patient Name:   ARISTIDES GROSS Date of Exam: 11/06/2023 Medical Rec #:  [id number]      Height:       68.0 in Accession #:    [id number]     Weight:       160.0 lb Date of Birth:  Jul 25, 1946      BSA:          1.859 m Patient Age:    77 years       BP:           128/68 mmHg Patient Gender: M              HR:           100 bpm. Exam Location:  Inpatient Procedure: 2D Echo, Cardiac Doppler and Color Doppler Indications:    Atrial Flutter I48.92  History:        Patient has no prior history of Echocardiogram examinations.  Sonographer:    Dondra Prader RVT RCS Referring Phys: Dolly Rias IMPRESSIONS  1. Left ventricular ejection fraction, by estimation, is 55 to 60%. The left ventricle has normal function. The left ventricle has no regional wall motion abnormalities. Left ventricular diastolic parameters are indeterminate.  2. Right ventricular systolic function is normal. The right ventricular size is normal.  3. The mitral valve is abnormal. Mild mitral valve regurgitation. No evidence of mitral stenosis.  4. Tricuspid valve regurgitation is mild to moderate.  5. The aortic valve is tricuspid. There is mild calcification of the aortic valve. Aortic valve regurgitation is trivial. Aortic valve sclerosis is present, with no evidence of aortic valve stenosis.  6. Aortic dilatation noted. There is mild dilatation of the ascending aorta, measuring 39 mm.  7. The inferior vena cava is normal in size with greater than 50% respiratory variability, suggesting right atrial pressure of 3 mmHg. FINDINGS  Left Ventricle: Left ventricular ejection fraction, by estimation, is 55 to 60%. The left ventricle has normal function. The left ventricle has no regional wall motion abnormalities. The left ventricular internal cavity size was normal in size.  There is  no left ventricular hypertrophy. Left ventricular diastolic parameters are indeterminate. Right Ventricle: The right ventricular size is normal. No increase in right ventricular wall thickness. Right ventricular systolic function is normal. Left Atrium: Left atrial size was normal in size. Right Atrium: Right atrial size was normal in size. Pericardium: Trivial pericardial effusion is present. The pericardial effusion is posterior to the left ventricle. Mitral Valve: The mitral valve is abnormal. There is mild thickening of the mitral valve leaflet(s). Mild mitral valve regurgitation. No evidence of mitral valve stenosis. Tricuspid Valve: The tricuspid valve is normal in structure. Tricuspid valve regurgitation is mild to moderate. No evidence of  tricuspid stenosis. Aortic Valve: The aortic valve is tricuspid. There is mild calcification of the aortic valve. Aortic valve regurgitation is trivial. Aortic valve sclerosis is present, with no evidence of aortic valve stenosis. Aortic valve mean gradient measures 2.3 mmHg. Aortic valve peak gradient measures 4.4 mmHg. Aortic valve area, by VTI measures 3.26 cm. Pulmonic Valve: The pulmonic valve was normal in structure. Pulmonic valve regurgitation is not visualized. No evidence of pulmonic stenosis. Aorta: Aortic dilatation noted. There is mild dilatation of the ascending aorta, measuring 39 mm. Venous: The inferior vena cava is normal in size with greater than 50% respiratory variability, suggesting right atrial pressure of 3 mmHg. IAS/Shunts: No atrial level shunt detected by color flow Doppler.  LEFT VENTRICLE PLAX 2D LVIDd:         4.50 cm   Diastology LVIDs:         3.10 cm   LV e' medial:    8.59 cm/s LV PW:         1.10 cm   LV E/e' medial:  12.1 LV IVS:        0.80 cm   LV e' lateral:   14.90 cm/s LVOT diam:     2.00 cm   LV E/e' lateral: 7.0 LV SV:         49 LV SV Index:   26 LVOT Area:     3.14 cm  RIGHT VENTRICLE             IVC RV Basal diam:   2.90 cm     IVC diam: 1.00 cm RV Mid diam:    2.30 cm RV S prime:     14.80 cm/s TAPSE (M-mode): 1.6 cm LEFT ATRIUM             Index        RIGHT ATRIUM           Index LA diam:        3.60 cm 1.94 cm/m   RA Area:     12.20 cm LA Vol (A2C):   52.6 ml 28.30 ml/m  RA Volume:   25.30 ml  13.61 ml/m LA Vol (A4C):   43.9 ml 23.59 ml/m LA Biplane Vol: 51.4 ml 27.65 ml/m  AORTIC VALVE                    PULMONIC VALVE AV Area (Vmax):    3.00 cm     PV Vmax:       0.84 m/s AV Area (Vmean):   2.82 cm     PV Peak grad:  2.8 mmHg AV Area (VTI):     3.26 cm AV Vmax:           104.33 cm/s AV Vmean:          69.300 cm/s AV VTI:            0.149 m AV Peak Grad:      4.4 mmHg AV Mean Grad:      2.3 mmHg LVOT Vmax:         99.75 cm/s LVOT Vmean:        62.100 cm/s LVOT VTI:          0.155 m LVOT/AV VTI ratio: 1.04  AORTA Ao Root diam: 3.10 cm Ao Asc diam:  3.90 cm MITRAL VALVE                TRICUSPID VALVE MV Area (PHT): 5.70 cm  TR Peak grad:   37.5 mmHg MV Decel Time: 133 msec     TR Vmax:        306.00 cm/s MV E velocity: 103.70 cm/s MV A velocity: 40.20 cm/s   SHUNTS MV E/A ratio:  2.58         Systemic VTI:  0.16 m                             Systemic Diam: 2.00 cm Charlton Haws MD Electronically signed by Charlton Haws MD Signature Date/Time: 11/06/2023/3:53:23 PM    Final    DG Chest Port 1 View Result Date: 11/05/2023 CLINICAL DATA:  AFib. EXAM: PORTABLE CHEST 1 VIEW COMPARISON:  08/11/2005. FINDINGS: The heart size and mediastinal contours are within normal limits. There is atherosclerotic calcification of the aorta. No consolidation, effusion, or pneumothorax is seen. No acute osseous abnormality. IMPRESSION: No active disease. Electronically Signed   By: Thornell Sartorius M.D.   On: 11/05/2023 21:58   CT Angio Aortobifemoral W and/or Wo Contrast Result Date: 11/05/2023 CLINICAL DATA:  Claudication or leg ischemia Pt reports he has been having pain in his right foot for about 3 weeks and has had a negative [place]  and xray and was given an MRI yesterday and was told a nerve was pressing on something EXAM: CT ANGIOGRAPHY OF ABDOMINAL AORTA WITH ILIOFEMORAL RUNOFF TECHNIQUE: Multidetector CT imaging of the abdomen, pelvis and lower extremities was performed using the standard protocol during bolus administration of intravenous contrast. Multiplanar CT image reconstructions and MIPs were obtained to evaluate the vascular anatomy. RADIATION DOSE REDUCTION: This exam was performed according to the departmental dose-optimization program which includes automated exposure control, adjustment of the mA and/or kV according to patient size and/or use of iterative reconstruction technique. CONTRAST:  OMNIPAQUE IOHEXOL 350 MG/ML SOLN COMPARISON:  None Available. FINDINGS: VASCULAR Aorta: Severe atherosclerotic plaque. Normal caliber aorta without aneurysm, dissection, vasculitis or significant stenosis. Celiac: Patent without evidence of aneurysm, dissection, vasculitis or significant stenosis. SMA: Patent without evidence of aneurysm, dissection, vasculitis or significant stenosis. Renals: Both renal arteries are patent without evidence of aneurysm, dissection, vasculitis, fibromuscular dysplasia or significant stenosis. IMA: Patent without evidence of aneurysm, dissection, vasculitis or significant stenosis. RIGHT Lower Extremity Severe atherosclerotic plaque. Short segment severe narrowing of the left external iliac artery (4:103). 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 A 2 cm almost complete occlusion of the origin of the RIGHT superficial femoral artery. Re-opacification distally. A 4 cm almost complete occlusion of the RIGHT proximal superficial femoral artery. Reopacification distally. LEFT Lower Extremity Severe atherosclerotic plaque. A 3 cm almost complete occlusion of the LEFT common femoral artery. Re-opacification distally with three-vessel runoff. VEINS: No  obvious venous abnormality within the limitations of this arterial phase study. Review of the MIP images confirms the above findings. NON-VASCULAR Lower chest: No acute abnormality. Hepatobiliary: No focal liver abnormality. No gallstones, gallbladder wall thickening, or pericholecystic fluid. No biliary dilatation. Pancreas: No focal lesion. Normal pancreatic contour. No surrounding inflammatory changes. No main pancreatic ductal dilatation. Spleen: Normal in size without focal abnormality. Adrenals/Urinary Tract: No adrenal nodule bilaterally. Bilateral kidneys enhance symmetrically. Fluid density lesions of the left kidney likely represent simple renal cysts. Simple renal cysts, in the absence of clinically indicated signs/symptoms, require no independent follow-up. No hydronephrosis. No hydroureter. The urinary bladder is unremarkable. Stomach/Bowel: Stomach is within normal limits. No evidence  of bowel wall thickening or dilatation. Appendix appears normal. Lymphatic: No lymphadenopathy. Reproductive: Prostate is unremarkable. Other: No intraperitoneal free fluid. No intraperitoneal free gas. No organized fluid collection. Musculoskeletal: No abdominal wall hernia or abnormality. No suspicious lytic or blastic osseous lesions. No acute displaced fracture. Multilevel degenerative changes of the spine. No evidence of fracture, dislocation, or joint effusion of either lower extremity. No evidence of severe arthropathy and no aggressive appearing focal bone abnormality a either lower extremities. Soft tissues are unremarkable. IMPRESSION: VASCULAR 1.  Aortic Atherosclerosis (ICD10-I70.0)-severe. 2. 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 3. A 2 cm almost complete occlusion of the origin of the RIGHT superficial femoral artery. Re-opacification distally. 4. A 4 cm almost complete occlusion of the RIGHT proximal superficial femoral artery. Reopacification  distally. 5. A 3 cm almost complete occlusion of the LEFT common femoral artery. Re-opacification distally with three-vessel runoff. NON-VASCULAR 1. No acute intra-abdominal or intrapelvic abnormality. 2. No acute abnormality of the bilateral lower extremities. These results were called by telephone at the time of interpretation on 11/05/2023 at 9:00 pm to provider Ariel PA, who verbally acknowledged these results. Electronically Signed   By: Tish Frederickson M.D.   On: 11/05/2023 21:07     Assessment and Plan:   Newly diagnosed, persistent atrial flutter  - Patient presented to the ED complaining of right foot pain. Found to be in atrial flutter with RVR. Was treated with IV dilt drip. Now on PO dilt 60 mg q6hrs  - Echocardiogram this admission showed EF 55-60%, no wall motion abnormalities, normal RV function, mild MR, mild-moderate TR.  - TSH  normal. K and mag within normal limits  - Per telemetry, HR in the 100s-120s. He is asymptomatic. BP stable  - Continue cardizem 60 mg q6 hours - Start metoprolol tartrate 25 mg BID  - CHADS-VASc 5. Continue IV heparin with plans to transition to [facility] prior to DC/when OK with vascular surgery  - If patient remains in atrial flutter, could consider outpatient DCCV after 3 weeks uninterrupted AC.   Elevated Troponin  - hsTn minimally elevated on arrival 22>26 - Patient denies chest pain  - Echo with EF 55-60%, no regional wall motion abnormalities  - Suspect demand ischemia in the setting of afib with RVR   PAD  - Managed by Vascular surgery  - Lipid panel this admission with LDL 40   Otherwise per primary  - Right popliteal artery occlusion  - PAD with bilateral SFT occlusions  - Lactic acidosis  - Rhabdomyolysis  - AKI on CKD stage IIIa    Risk Assessment/Risk Scores:   CHA2DS2-VASc Score = 5  This indicates a 7.2% annual risk of stroke. The patient's score is based upon: CHF History: 0 HTN History: 1 Diabetes History: 1 Stroke  History: 0 Vascular Disease History: 1 Age Score: 2 Gender Score: 0     For questions or updates, please contact [facility] Please consult [email or web address] for contact info under    Signed, Jonita Albee, PA-C  11/08/2023 1:23 PM   I have seen and examined this patient.  I have reviewed the attached note and agree with its content unless detailed differently below.    GEN: Well nourished, well developed, in no acute distress  Cardiac: iRRR; no murmurs, rubs, or gallops, no edema  Respiratory:  normal work of breathing  Atrial fibrillation, flutter He is not aware of his atrial arrhythmia --no palpitations, and  he denies any recent fatigue I think this is largely A-fib, occasionally organizing with the appearance of flutter Would switch from diltiazem to metoprolol; start metoprolol 25 twice daily and titrate up, weaning off carvedilol Continue IV heparin and transition to DOAC when safe from postop standpoint  Troponin elevation Minimal troponin elevation No symptoms to suggest acute coronary syndrome EF normal without regional wall motion abnormalities Very low suspicion for acute coronary syndrome  PAD Per vascular surgery    York Pellant MD 11/08/2023 2:01 PM

## 2023-11-08 NOTE — Evaluation (Signed)
Physical Therapy Evaluation Patient Details Name: Manuel Winters MRN: [id number] DOB: October 29, 1945 Today's Date: 11/08/2023  History of Present Illness  Pt is a 78 y/o male presenting 11/05/23 with persistent right lower extremity pain in setting of limb ischemia. Underwent R common femoral endarterectomy, thrombectomy via femoral approach, popliteal cutdown w/ tibial thrombectomy and 4 compartment fasciotomies on 1/17. PMH: HTN, DM2, HLD, CKD   Clinical Impression  Pt presents with condition above and deficits mentioned below, see PT Problem List. PTA, he was independent without DME, living with his wife in a mobile home with 2 STE. Currently, pt demonstrates deficits in R knee and ankle ROM and R leg weight bearing tolerance due to pain. He also demonstrates deficits in balance and activity tolerance. Currently, he is requiring minA for transfers, to take a couple steps with a RW, and to transition sit to supine. Gait distance limited this date due to his HR elevating to 124 bpm when standing and RN requesting to keep HR < 120 bpm. His HR quickly improved with rest. Educated pt and wife on AROM of R ankle and knee as HEP. Pt will likely progress well once his pain improves and HR is better under control, thus anticipating pt will be able to d/c home with HHPT. Will continue to follow acutely.      If plan is discharge home, recommend the following: A little help with walking and/or transfers;A little help with bathing/dressing/bathroom;Assistance with cooking/housework;Help with stairs or ramp for entrance;Assist for transportation   Can travel by private vehicle        Equipment Recommendations Rolling walker (2 wheels);BSC/3in1  Recommendations for Other Services       Functional Status Assessment Patient has had a recent decline in their functional status and demonstrates the ability to make significant improvements in function in a reasonable and predictable amount of time.      Precautions / Restrictions Precautions Precautions: Fall;Other (comment) Precaution Comments: watch HR (goal < 120 per RN on 1/19) Restrictions Weight Bearing Restrictions Per Provider Order: No      Mobility  Bed Mobility Overal bed mobility: Needs Assistance Bed Mobility: Sit to Supine       Sit to supine: Min assist, HOB elevated   General bed mobility comments: HOB elevated, minA needed to lightly assist R leg up onto bed to supine    Transfers Overall transfer level: Needs assistance Equipment used: Rolling walker (2 wheels) Transfers: Sit to/from Stand, Bed to chair/wheelchair/BSC Sit to Stand: Min assist   Step pivot transfers: Min assist       General transfer comment: MinA needed for balance to stand from recliner. MinA for balance during step pivot to L to bed from recliner with RW, needing cues to ensure he was more proximal to EOB before sitting    Ambulation/Gait Ambulation/Gait assistance: Min assist Gait Distance (Feet): 2 Feet Assistive device: Rolling walker (2 wheels) Gait Pattern/deviations: Decreased step length - right, Decreased step length - left, Decreased stance time - right, Trunk flexed, Antalgic Gait velocity: reduced Gait velocity interpretation: <1.31 ft/sec, indicative of household ambulator   General Gait Details: Pt takes slow, small, unsteady, steps to L to bed from recliner with RW and minA for balance. Decreased R stance time noted due to pain. Distance limited due to HR elevating to 124 bpm (RN reporting goal for < 120 today)  Stairs            Wheelchair Mobility     Tilt Bed  Modified Rankin (Stroke Patients Only)       Balance Overall balance assessment: Needs assistance Sitting-balance support: No upper extremity supported, Feet supported Sitting balance-Leahy Scale: Fair     Standing balance support: Bilateral upper extremity supported, During functional activity, Reliant on assistive device for  balance Standing balance-Leahy Scale: Poor Standing balance comment: Reliant on RW                             Pertinent Vitals/Pain Pain Assessment Pain Assessment: Faces Faces Pain Scale: Hurts even more Pain Location: R leg Pain Descriptors / Indicators: Grimacing, Guarding, Sore, Discomfort, Moaning Pain Intervention(s): Limited activity within patient's tolerance, Monitored during session, Repositioned    Home Living Family/patient expects to be discharged to:: Private residence Living Arrangements: Spouse/significant other Available Help at Discharge: Family;Available PRN/intermittently Type of Home: Mobile home Home Access: Stairs to enter   Entrance Stairs-Number of Steps: 2   Home Layout: One level Home Equipment: Shower seat      Prior Function Prior Level of Function : Independent/Modified Independent;Driving             Mobility Comments: no AD typically but limited by LE pain recently ADLs Comments: Indep with ADLs, IADLs, enjoys being active     Extremity/Trunk Assessment   Upper Extremity Assessment Upper Extremity Assessment: Defer to OT evaluation    Lower Extremity Assessment Lower Extremity Assessment: RLE deficits/detail RLE Deficits / Details: edema noted distally; ACE wrap around lower leg; decreased ankle ROM due to pain, particularly dorsiflexion, achieving about neutral AAROM; decreased knee flexion/extension ROM due to pain, limited ~10' knee extension AROM    Cervical / Trunk Assessment Cervical / Trunk Assessment: Normal  Communication   Communication Communication: No apparent difficulties  Cognition Arousal: Alert Behavior During Therapy: WFL for tasks assessed/performed Overall Cognitive Status: Within Functional Limits for tasks assessed                                          General Comments General comments (skin integrity, edema, etc.): HR up to 124 bpm when step pivoting to bed from chair,  reduced to 100s when sitting and then supine again; encouraged ankle pumps and heel slides and quad sets as HEP, wife and pt verbalized understanding    Exercises General Exercises - Lower Extremity Ankle Circles/Pumps: AROM, Right, AAROM, 5 reps, Supine Quad Sets: Right, 10 reps, Supine, AAROM Long Arc Quad: AROM, Right, 5 reps, Seated Heel Slides: AAROM, Right, 10 reps, Supine   Assessment/Plan    PT Assessment Patient needs continued PT services  PT Problem List Decreased strength;Decreased range of motion;Decreased activity tolerance;Decreased balance;Decreased mobility;Decreased knowledge of use of DME;Cardiopulmonary status limiting activity;Pain       PT Treatment Interventions DME instruction;Gait training;Stair training;Functional mobility training;Therapeutic activities;Therapeutic exercise;Balance training;Neuromuscular re-education;Patient/family education    PT Goals (Current goals can be found in the Care Plan section)  Acute Rehab PT Goals Patient Stated Goal: to reduce pain and improve PT Goal Formulation: With patient/family Time For Goal Achievement: 11/22/23 Potential to Achieve Goals: Good    Frequency Min 1X/week     Co-evaluation               AM-PAC PT "6 Clicks" Mobility  Outcome Measure Help needed turning from your back to your side while in a flat bed without using bedrails?:  A Little Help needed moving from lying on your back to sitting on the side of a flat bed without using bedrails?: A Little Help needed moving to and from a bed to a chair (including a wheelchair)?: A Little Help needed standing up from a chair using your arms (e.g., wheelchair or bedside chair)?: A Little Help needed to walk in hospital room?: Total (<20 ft) Help needed climbing 3-5 steps with a railing? : Total 6 Click Score: 14    End of Session Equipment Utilized During Treatment: Gait belt Activity Tolerance: Patient tolerated treatment well;Other (comment) (limited  by elevated HR) Patient left: in bed;with call bell/phone within reach;with bed alarm set;with family/visitor present;with nursing/sitter in room Nurse Communication: Mobility status;Other (comment) (IV out upon PT arrival; HR) PT Visit Diagnosis: Unsteadiness on feet (R26.81);Other abnormalities of gait and mobility (R26.89);Muscle weakness (generalized) (M62.81);Difficulty in walking, not elsewhere classified (R26.2);Pain Pain - Right/Left: Right Pain - part of body: Leg    Time: 1411-1425 PT Time Calculation (min) (ACUTE ONLY): 14 min   Charges:   PT Evaluation $PT Eval Moderate Complexity: 1 Mod   PT General Charges $$ ACUTE PT VISIT: 1 Visit         Virgil Benedict, PT, DPT Acute Rehabilitation Services  Office: [phone]   Bettina Gavia 11/08/2023, 2:55 PM

## 2023-11-08 NOTE — Progress Notes (Signed)
PHARMACY - ANTICOAGULATION CONSULT NOTE  Pharmacy Consult for Heparin Infusion Indication:  arterial occlusion  No Known Allergies  Patient Measurements: Height: 5\' 8"  (172.7 cm) Weight: 72.6 kg (160 lb) IBW/kg (Calculated) : 68.4 Heparin Dosing Weight: 73.4 kg  Vital Signs: Temp: 98.6 F (37 C) (01/19 0801) Temp Source: Oral (01/19 0801) BP: 116/68 (01/19 0801) Pulse Rate: 105 (01/19 0801)  Labs: Recent Labs    11/05/23 1834 11/05/23 2020 11/05/23 2202 11/05/23 2355 11/06/23 0411 11/06/23 1207 11/06/23 2204 11/07/23 0345 11/07/23 1539 11/08/23 0259 11/08/23 0915  HGB  --   --   --   --  11.9* 8.8*  --  8.9*  --  9.1*  --   HCT  --   --   --   --  35.6* 26.0*  --  26.0*  --  26.9*  --   PLT  --   --   --   --  229  --   --  182  --  168  --   APTT  --   --   --  113*  --   --   --   --   --   --   --   LABPROT  --   --  14.2  --  14.5  --   --   --   --   --   --   INR  --   --  1.1  --  1.1  --   --   --   --   --   --   HEPARINUNFRC  --   --   --   --  0.78*  --    < >  --  0.85* 0.53 0.47  CREATININE  --   --   --   --  1.59*  --   --  1.50*  --  1.48*  --   CKTOTAL  --   --   --  5,733*  --   --   --  12,526*  --  9,430*  --   TROPONINIHS 22* 26*  --   --   --   --   --   --   --   --   --    < > = values in this interval not displayed.    Estimated Creatinine Clearance: 39.8 mL/min (A) (by C-G formula based on SCr of 1.48 mg/dL (H)).   Medical History: Past Medical History:  Diagnosis Date   Hypertension    Assessment: Patient is a 78yo male presenting with severe right leg pain. CT angiogram showed arterial occlusion. Reviewed home meds and no anticoagulants noted. Pharmacy consulted for Heparin dosing. Patient is s/p right CFA endarterectomy with bovine patch angiopasty, RLE thrombectomy, cutdown popliteal with tibial thrombectomy of the AT, PT, peroneal arteries and 4 compartment fasciotomies.   1/19 AM: Confirmatory heparin level therapeutic at 0.47  on 1050 units/hr. Per RN, no issues with the heparin infusion running continuously or signs/symptoms of bleeding. CBC shows Hgb drop in hgb to 9.1k and plts 168k.   Goal of Therapy:  Heparin level 0.3-0.7 units/ml Monitor platelets by anticoagulation protocol: Yes   Plan:  Continue heparin infusion at 1050 units/hr Check anti-Xa level daily while on heparin Continue to monitor H&H and platelets  Blane Ohara, PharmD, BCPS PGY2 Pharmacy Resident

## 2023-11-08 NOTE — Progress Notes (Addendum)
Patient has been requesting for pain meds frequently and his HR and RR would be high when he complains of pain. IV analgesics given before, asked for pain meds, oral analgesics given for strong pain. Patient seems to be confused and disoriented to the situation, he pulled his two IV access and wife also mentioned that he is confused today,   Informed to the MD via secure chat

## 2023-11-09 ENCOUNTER — Encounter (HOSPITAL_COMMUNITY): Payer: Self-pay | Admitting: Vascular Surgery

## 2023-11-09 DIAGNOSIS — I70209 Unspecified atherosclerosis of native arteries of extremities, unspecified extremity: Secondary | ICD-10-CM | POA: Diagnosis not present

## 2023-11-09 DIAGNOSIS — R7989 Other specified abnormal findings of blood chemistry: Secondary | ICD-10-CM | POA: Diagnosis not present

## 2023-11-09 DIAGNOSIS — I480 Paroxysmal atrial fibrillation: Secondary | ICD-10-CM

## 2023-11-09 LAB — CBC
HCT: 24.5 % — ABNORMAL LOW (ref 39.0–52.0)
Hemoglobin: 8.2 g/dL — ABNORMAL LOW (ref 13.0–17.0)
MCH: 29.7 pg (ref 26.0–34.0)
MCHC: 33.5 g/dL (ref 30.0–36.0)
MCV: 88.8 fL (ref 80.0–100.0)
Platelets: 160 10*3/uL (ref 150–400)
RBC: 2.76 MIL/uL — ABNORMAL LOW (ref 4.22–5.81)
RDW: 13.4 % (ref 11.5–15.5)
WBC: 8.6 10*3/uL (ref 4.0–10.5)
nRBC: 0 % (ref 0.0–0.2)

## 2023-11-09 LAB — GLUCOSE, CAPILLARY
Glucose-Capillary: 127 mg/dL — ABNORMAL HIGH (ref 70–99)
Glucose-Capillary: 189 mg/dL — ABNORMAL HIGH (ref 70–99)
Glucose-Capillary: 151 mg/dL — ABNORMAL HIGH (ref 70–99)
Glucose-Capillary: 134 mg/dL — ABNORMAL HIGH (ref 70–99)

## 2023-11-09 LAB — HEPARIN LEVEL (UNFRACTIONATED): Heparin Unfractionated: 0.37 [IU]/mL (ref 0.30–0.70)

## 2023-11-09 MED ORDER — CEFAZOLIN SODIUM-DEXTROSE 1-4 GM/50ML-% IV SOLN
1.0000 g | INTRAVENOUS | Status: AC
  Administered 2023-11-10: 2 g via INTRAVENOUS
  Filled 2023-11-09: qty 50

## 2023-11-09 NOTE — Plan of Care (Signed)
  Problem: Education: Goal: Ability to describe self-care measures that may prevent or decrease complications (Diabetes Survival Skills Education) will improve Outcome: Progressing Goal: Individualized Educational Video(s) Outcome: Progressing   Problem: Skin Integrity: Goal: Risk for impaired skin integrity will decrease Outcome: Progressing   Problem: Tissue Perfusion: Goal: Adequacy of tissue perfusion will improve Outcome: Progressing   Problem: Clinical Measurements: Goal: Ability to maintain clinical measurements within normal limits will improve Outcome: Progressing Goal: Will remain free from infection Outcome: Progressing Goal: Diagnostic test results will improve Outcome: Progressing Goal: Respiratory complications will improve Outcome: Progressing Goal: Cardiovascular complication will be avoided Outcome: Progressing

## 2023-11-09 NOTE — Progress Notes (Signed)
PROGRESS NOTE    Manuel Winters  JXB:[id number] DOB: August 05, 1946 DOA: 11/05/2023 PCP: Oletha Blend, MD   Brief Narrative: This 78 yrs old Male with PMH significant for hypertension, diabetes type 2, hyperlipidemia, CKD 3, recent ED and outpatient visits for right leg pain, who presents due to persistent symptoms. Initially reports that his pain has been ongoing for about 1 month. He is adamant that he had no symptoms of claudication or leg pain prior to a fall that occurred about a month ago. He was chasing a truck and tripped and fell, reports about a week later developed pain from below the knee down to the foot which is intermittent and severe. He denies any numbness or weakness in the foot but does have deficits on exam. Right leg has been cool. Pain has been progressive especially in the past week, has been trying to ambulate with the use of crutches but in the past few days not able to get up much at all. Had no known history of PAD or other ASCVD prior, not taking antiplatelets or anticoagulants prior to this admission. No history of bleeding issues. He was admitted for further evaluation.   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 PAD with bilateral SFA occlusions: Patient has chronic PAD now presented with worsening right leg symptoms. Etiology of this possibly atherothrombotic /embolic from underlying PAD v less likely cardioembolic with newly identified Aflutter.  RLE below the knee is cold with no pedal pulses on the Right, there is numbness in stocking distribution below the ankle on the R side, and strength is 3/5 with dorsiflexion at the ankle, no tissue loss.  CTA with complete occlusion at R popliteal with no runoff to the foot, additional occlusions R SFA origin + prox SFA with reconstitution, and L CFA with reconstitution and 3 vessel runoff on  the left.  Vascular surgery Dr. Lenell Antu recommends for transfer to [facility] and initiation of heparin drip.  Serial neurovascular checks of the right lower extremity Adequate pain control. Patient underwent Right CFA endarterectomy with bovine patch angiopasty, RLE thrombectomy, cutdown popliteal with tibial thrombectomy of the AT, PT, peroneal arteries and 4 compartment fasciotomies 11/06/2023 by Dr. Chestine Spore  Continue Adequate Pain control.  Vascular surgery has to evaluate for medial fasciotomy / closure.   Lactic acidosis:  Continue with IV fluids.  Lactic acid normalized.   Rhabdomyolysis:  Elevated CKD levels. Continue with IV fluids.  CK level continues to remain elevated , trend CK level.   Acute myocardial injury: No history of chest pain.  High-sensitivity troponin 22 -> 26.   EKG with a flutter with no acute ischemic changes. Likely demand event in the setting of arrhythmia, management directed at underlying A-fib above.  Persistent atrial Flutter : Patient presented with atrial flutter, started on IV diltiazem drip. Echo showed LVEF 55 to 60%, TSH normal Heart rate now controlled, Continue Lopressor 25 mg twice daily. Cardizem discontinued. CHADS2 score 5. Patient on IV heparin with plan to transition to DOAC before discharge when okay with vascular surgery. If patient continued to remain in A-fib,  Consider outpatient DCCV after 3 weeks of uninterrupted AC.  AKI on CKD stage IIIa: Improving with IV fluids.  Holding chlorthalidone and losartan.   Essential Hypertension: BP parameters have improved.  Continue metoprolol 25 mg twice daily.   DM Type 2 : Resume SSI.  Hold  PO DM meds.   GERD: Continue with PPI.    Hypercalcemia: Resolved with IV fluids.      Estimated body mass index is 24.33 kg/m as calculated from the following:   Height as of this encounter: 5\' 8"  (1.727 m).   Weight as of this encounter: 72.6 kg.  DVT prophylaxis: IV heparin Code Status:  Full code Family Communication: Family at bed side. Disposition Plan:    Status is: Inpatient Remains inpatient appropriate because: Needs further vascular intervention,   Consultants:  Vascular surgery Cardiology  Procedures:  Antimicrobials:  Anti-infectives (From admission, onward)    Start     Dose/Rate Route Frequency Ordered Stop   11/10/23 0600  ceFAZolin (ANCEF) IVPB 1 g/50 mL premix       Note to Pharmacy: Send with pt to OR   1 g 100 mL/hr over 30 Minutes Intravenous On call 11/09/23 0802 11/11/23 0600   11/06/23 0600  penicillin g benzathine (BICILLIN LA) 1200000 UNIT/2ML injection 2.4 Million Units  Status:  Discontinued        2.4 Million Units Intramuscular  Once 11/06/23 0551 11/06/23 0551   11/06/23 0600  cefTRIAXone (ROCEPHIN) injection 500 mg  Status:  Discontinued        500 mg Intramuscular  Once 11/06/23 0551 11/06/23 0551      Subjective: Patient was seen and examined at bedside. Overnight events noted.   Patient reports he is doing better. Patient states he is going to have procedure tomorrow.  Objective: Vitals:   11/08/23 2052 11/09/23 0600 11/09/23 0812 11/09/23 1124  BP: (!) 105/56 103/68 113/67 (!) 97/44  Pulse: 69 80 87 89  Resp: 20 18 20 17   Temp: 97.9 F (36.6 C) 98.3 F (36.8 C) (!) 97.3 F (36.3 C) 98.7 F (37.1 C)  TempSrc: Oral Oral Oral Oral  SpO2: 100% 99% 100% 100%  Weight:      Height:        Intake/Output Summary (Last 24 hours) at 11/09/2023 1202 Last data filed at 11/09/2023 1100 Gross per 24 hour  Intake 480 ml  Output 1050 ml  Net -570 ml   Filed Weights   11/05/23 1731 11/06/23 0915  Weight: 73.4 kg 72.6 kg    Examination:  General exam: Appears calm and comfortable , deconditioned, not in any acute distress. Respiratory system: CTA Bilaterally. Respiratory effort normal.  RR 15 Cardiovascular system: S1 & S2 heard, RRR. No JVD, murmurs, rubs, gallops or clicks.  Gastrointestinal system: Abdomen is non  distended, soft and non tender.  Normal bowel sounds heard. Central nervous system: Alert and oriented x 3. No focal neurological deficits. Extremities: RLE covered in dressing reports a lot of pain when moving Skin: No rashes, lesions or ulcers Psychiatry: Judgement and insight appear normal. Mood & affect appropriate.     Data Reviewed: I have personally reviewed following labs and imaging studies  CBC: Recent Labs  Lab 11/05/23 1748 11/06/23 0411 11/06/23 1207 11/07/23 0345 11/08/23 0259 11/09/23 0334  WBC 10.2 9.1  --  9.4 8.6 8.6  NEUTROABS 8.0*  --   --   --   --   --   HGB 13.3 11.9* 8.8* 8.9* 9.1* 8.2*  HCT 39.5 35.6* 26.0* 26.0* 26.9* 24.5*  MCV 87.6 88.3  --  87.0 87.1 88.8  PLT 244 229  --  182 168 160   Basic Metabolic Panel: Recent Labs  Lab 11/05/23 1831 11/05/23 1834 11/06/23 0411 11/06/23 1207 11/07/23 0345 11/08/23 0259  NA 134*  --  135 140 134* 137  K 3.9  --  4.0 4.0 4.1 3.6  CL 102  --  108  --  105 103  CO2 18*  --  19*  --  19* 21*  GLUCOSE 151*  --  134*  --  156* 151*  BUN 43*  --  35*  --  29* 23  CREATININE 2.19*  --  1.59*  --  1.50* 1.48*  CALCIUM 10.4*  --  9.5  --  9.1 9.5  MG  --  1.8 1.8  --  1.7  --   PHOS  --   --  3.1  --  2.7  --    GFR: Estimated Creatinine Clearance: 39.8 mL/min (A) (by C-G formula based on SCr of 1.48 mg/dL (H)). Liver Function Tests: Recent Labs  Lab 11/05/23 2135 11/07/23 0345  AST 114* 183*  ALT 19 21  ALKPHOS 60 48  BILITOT 0.6 0.8  PROT 8.3* 7.0  ALBUMIN 3.7 3.3*   No results for input(s): "LIPASE", "AMYLASE" in the last 168 hours. No results for input(s): "AMMONIA" in the last 168 hours. Coagulation Profile: Recent Labs  Lab 11/05/23 2202 11/06/23 0411  INR 1.1 1.1   Cardiac Enzymes: Recent Labs  Lab 11/05/23 2355 11/07/23 0345 11/08/23 0259  CKTOTAL 5,733* 12,526* 9,430*   BNP (last 3 results) No results for input(s): "PROBNP" in the last 8760 hours. HbA1C: No results for  input(s): "HGBA1C" in the last 72 hours. CBG: Recent Labs  Lab 11/08/23 1143 11/08/23 1651 11/08/23 2052 11/09/23 0623 11/09/23 1126  GLUCAP 137* 153* 168* 127* 189*   Lipid Profile: No results for input(s): "CHOL", "HDL", "LDLCALC", "TRIG", "CHOLHDL", "LDLDIRECT" in the last 72 hours. Thyroid Function Tests: No results for input(s): "TSH", "T4TOTAL", "FREET4", "T3FREE", "THYROIDAB" in the last 72 hours. Anemia Panel: No results for input(s): "VITAMINB12", "FOLATE", "FERRITIN", "TIBC", "IRON", "RETICCTPCT" in the last 72 hours. Sepsis Labs: Recent Labs  Lab 11/05/23 1952 11/05/23 2135 11/08/23 0259  LATICACIDVEN 1.9 2.1* 1.1    Recent Results (from the past 240 hours)  Surgical pcr screen     Status: Abnormal   Collection Time: 11/06/23  8:44 AM   Specimen: Nasal Mucosa; Nasal Swab  Result Value Ref Range Status   MRSA, PCR NEGATIVE NEGATIVE Final   Staphylococcus aureus POSITIVE (A) NEGATIVE Final    Comment: (NOTE) The Xpert SA Assay (FDA approved for NASAL specimens in patients 38 years of age and older), is one component of a comprehensive surveillance program. It is not intended to diagnose infection nor to guide or monitor treatment. Performed at [facility], [address], [place], [place] [address]     Radiology Studies: No results found.  Scheduled Meds:  Chlorhexidine Gluconate Cloth  6 each Topical Q0600   insulin aspart  0-6 Units Subcutaneous TID WC   latanoprost  1 drop Both Eyes QHS   metoprolol tartrate  25 mg Oral BID   mupirocin ointment  1 Application Nasal BID   pantoprazole  40 mg Oral Daily   sodium chloride flush  3 mL Intravenous Q12H   Continuous Infusions:  sodium chloride 100 mL/hr at 11/06/23 0012   [START ON 11/10/2023]  ceFAZolin (ANCEF) IV     heparin 1,100 Units/hr (11/09/23 1033)     LOS: 4 days    Time spent: 35 mins    Willeen Niece, MD [facility]   If 7PM-7AM, please contact night-coverage

## 2023-11-09 NOTE — Progress Notes (Addendum)
PHARMACY - ANTICOAGULATION CONSULT NOTE  Pharmacy Consult for Heparin Infusion Indication:  arterial occlusion  No Known Allergies  Patient Measurements: Height: 5\' 8"  (172.7 cm) Weight: 72.6 kg (160 lb) IBW/kg (Calculated) : 68.4 Heparin Dosing Weight: 73.4 kg  Vital Signs: Temp: 97.3 F (36.3 C) (01/20 0812) Temp Source: Oral (01/20 0812) BP: 113/67 (01/20 0812) Pulse Rate: 87 (01/20 0812)  Labs: Recent Labs    11/07/23 0345 11/07/23 1539 11/08/23 0259 11/08/23 0915 11/09/23 0334  HGB 8.9*  --  9.1*  --  8.2*  HCT 26.0*  --  26.9*  --  24.5*  PLT 182  --  168  --  160  HEPARINUNFRC  --    < > 0.53 0.47 0.37  CREATININE 1.50*  --  1.48*  --   --   CKTOTAL 12,526*  --  9,430*  --   --    < > = values in this interval not displayed.    Estimated Creatinine Clearance: 39.8 mL/min (A) (by C-G formula based on SCr of 1.48 mg/dL (H)).   Medical History: Past Medical History:  Diagnosis Date   Hypertension    Assessment: Patient is a 78yo male with new aflutter and s/p right CFA endarterectomy with bovine patch angiopasty, RLE thrombectomy, cutdown popliteal with tibial thrombectomy of the AT, PT, peroneal arteries and 4 compartment fasciotomies 1/17. No anticoagulation prior to admission. Pharmacy consulted for heparin.    Heparin level 0.37 is at low end of therapeutic on 1050 units/hr. Level has been trending down.  Possible return to OR 1/20.     Goal of Therapy:  Heparin level 0.3-0.7 units/ml Monitor platelets by anticoagulation protocol: Yes   Plan:  Increase heparin 1100 units/hr Monitor daily heparin level, CBC, signs/symptoms of bleeding  F/u switch to apixaban   Alphia Moh, PharmD, BCPS, BCCP Clinical Pharmacist  Please check AMION for all [facility] Pharmacy phone numbers After 10:00 PM, call Main Pharmacy [phone]

## 2023-11-09 NOTE — Progress Notes (Signed)
Physical Therapy Treatment Patient Details Name: Manuel Winters MRN: [id number] DOB: 05-12-1946 Today's Date: 11/09/2023   History of Present Illness Pt is a 78 y/o male presenting 11/05/23 with persistent right lower extremity pain in setting of limb ischemia. Underwent R common femoral endarterectomy, thrombectomy via femoral approach, popliteal cutdown w/ tibial thrombectomy and 4 compartment fasciotomies on 1/17. PMH: HTN, DM2, HLD, CKD    PT Comments  Pt received in supine and agreeable to session. Pt limited by RLE pain throughout session requiring increased assist for balance and cues due to slight impulsivity. Pt with tendency to move quickly and avoid RLE WB causing increased instability. Pt requires dense cues for RW proximity and management during short gait trial and keeps RLE extended forward despite cues. HR remains in the low 100's throughout session. Pt continues to benefit from PT services to progress toward functional mobility goals.     If plan is discharge home, recommend the following: A little help with walking and/or transfers;A little help with bathing/dressing/bathroom;Assistance with cooking/housework;Help with stairs or ramp for entrance;Assist for transportation   Can travel by private vehicle        Equipment Recommendations  Rolling walker (2 wheels);BSC/3in1    Recommendations for Other Services       Precautions / Restrictions Precautions Precautions: Fall;Other (comment) Precaution Comments: watch HR (goal < 120 per RN on 1/19) Restrictions Weight Bearing Restrictions Per Provider Order: No     Mobility  Bed Mobility Overal bed mobility: Needs Assistance Bed Mobility: Sit to Supine, Supine to Sit     Supine to sit: Contact guard Sit to supine: Contact guard assist   General bed mobility comments: increased time    Transfers Overall transfer level: Needs assistance Equipment used: Rolling walker (2 wheels) Transfers: Sit to/from Stand Sit to  Stand: Min assist           General transfer comment: cues for hand placement and min A for anterior weight shift    Ambulation/Gait Ambulation/Gait assistance: Min assist Gait Distance (Feet): 10 Feet Assistive device: Rolling walker (2 wheels) Gait Pattern/deviations: Trunk flexed, Step-to pattern, Decreased stride length, Antalgic       General Gait Details: step-through pattern with very little to no RLE WB due to pain. Cues for RW proximity due to pt stepping in front of RW and demonstrating increased posterior bias requiring intermittent min A for balance. Cues for RW management due to pt picking it up to advance      Balance Overall balance assessment: Needs assistance Sitting-balance support: No upper extremity supported, Feet supported Sitting balance-Leahy Scale: Fair     Standing balance support: Bilateral upper extremity supported, During functional activity, Reliant on assistive device for balance Standing balance-Leahy Scale: Poor Standing balance comment: Reliant on RW                            Cognition Arousal: Alert Behavior During Therapy: WFL for tasks assessed/performed Overall Cognitive Status: Within Functional Limits for tasks assessed                                          Exercises      General Comments General comments (skin integrity, edema, etc.): HR in low 100s throughout session      Pertinent Vitals/Pain Pain Assessment Pain Assessment: Faces Faces Pain Scale: Hurts whole  lot Pain Location: R leg Pain Descriptors / Indicators: Grimacing, Guarding, Sore, Discomfort, Moaning Pain Intervention(s): Limited activity within patient's tolerance, Monitored during session, Repositioned     PT Goals (current goals can now be found in the care plan section) Acute Rehab PT Goals Patient Stated Goal: to reduce pain and improve PT Goal Formulation: With patient/family Time For Goal Achievement:  11/22/23 Progress towards PT goals: Progressing toward goals    Frequency    Min 1X/week       AM-PAC PT "6 Clicks" Mobility   Outcome Measure  Help needed turning from your back to your side while in a flat bed without using bedrails?: A Little Help needed moving from lying on your back to sitting on the side of a flat bed without using bedrails?: A Little Help needed moving to and from a bed to a chair (including a wheelchair)?: A Little Help needed standing up from a chair using your arms (e.g., wheelchair or bedside chair)?: A Little Help needed to walk in hospital room?: Total (>52ft) Help needed climbing 3-5 steps with a railing? : Total 6 Click Score: 14    End of Session Equipment Utilized During Treatment: Gait belt Activity Tolerance: Patient tolerated treatment well;Patient limited by pain Patient left: in bed;with call bell/phone within reach;with family/visitor present;with nursing/sitter in room Nurse Communication: Mobility status PT Visit Diagnosis: Unsteadiness on feet (R26.81);Other abnormalities of gait and mobility (R26.89);Muscle weakness (generalized) (M62.81);Difficulty in walking, not elsewhere classified (R26.2);Pain     Time: 1610-9604 PT Time Calculation (min) (ACUTE ONLY): 15 min  Charges:    $Gait Training: 8-22 mins PT General Charges $$ ACUTE PT VISIT: 1 Visit                     Johny Shock, PTA Acute Rehabilitation Services Secure Chat Preferred  [phone]    Johny Shock 11/09/2023, 3:27 PM

## 2023-11-09 NOTE — Progress Notes (Addendum)
  Progress Note    11/09/2023 6:46 AM 3 Days Post-Op  Subjective:  says his foot feels better this morning.  Afebrile   Vitals:   11/08/23 1706 11/08/23 2052  BP: (!) 101/59 (!) 105/56  Pulse: 78 69  Resp: 17 20  Temp: 98.2 F (36.8 C) 97.9 F (36.6 C)  SpO2: 98% 100%    Physical Exam: General:  no distress Cardiac:  irregular Lungs:  non labored Extremities:  + AT doppler signal; sensation improving; right foot is warm.    CBC    Component Value Date/Time   WBC 8.6 11/09/2023 0334   RBC 2.76 (L) 11/09/2023 0334   HGB 8.2 (L) 11/09/2023 0334   HCT 24.5 (L) 11/09/2023 0334   PLT 160 11/09/2023 0334   MCV 88.8 11/09/2023 0334   MCH 29.7 11/09/2023 0334   MCHC 33.5 11/09/2023 0334   RDW 13.4 11/09/2023 0334   LYMPHSABS 1.5 11/05/2023 1748   MONOABS 0.7 11/05/2023 1748   EOSABS 0.0 11/05/2023 1748   BASOSABS 0.0 11/05/2023 1748    BMET    Component Value Date/Time   NA 137 11/08/2023 0259   K 3.6 11/08/2023 0259   CL 103 11/08/2023 0259   CO2 21 (L) 11/08/2023 0259   GLUCOSE 151 (H) 11/08/2023 0259   BUN 23 11/08/2023 0259   CREATININE 1.48 (H) 11/08/2023 0259   CALCIUM 9.5 11/08/2023 0259   GFRNONAA 48 (L) 11/08/2023 0259    INR    Component Value Date/Time   INR 1.1 11/06/2023 0411     Intake/Output Summary (Last 24 hours) at 11/09/2023 0646 Last data filed at 11/08/2023 1837 Gross per 24 hour  Intake 720 ml  Output 550 ml  Net 170 ml      Assessment/Plan:  78 y.o. male is s/p:  Right CFA endarterectomy with bovine patch angiopasty, RLE thrombectomy, cutdown popliteal with tibial thrombectomy of the AT, PT, peroneal arteries and 4 compartment fasciotomies   3 Days Post-Op   -pt with right AT doppler signal. -will return with Dr. Chestine Spore to evaluate medial fasciotomy to determine if he can go to OR today for closure.  Continue npo for now.  -DVT prophylaxis:  heparin gtt   Doreatha Massed, PA-C [facility] [phone] 11/09/2023 6:46 AM  I have seen and evaluated the patient. I agree with the PA note as documented above.  Right groin looks good after endarterectomy with bovine patch and right lower extremity thrombectomy including popliteal cutdown with fasciotomies.  Still has brisk AT DP signal in the right foot.  States his foot feels a lot better.  Still has profound motor deficit consistent with initial presentation.  Continue heparin.  OR tomorrow for attempted fasciotomy closure.  NPO after midnight.  Cephus Shelling, MD [facility] [place] Office: [phone]

## 2023-11-09 NOTE — Progress Notes (Signed)
Progress Note  Patient Name: Manuel Winters Date of Encounter: 11/09/2023  [facility] Cardiologist: York Pellant, MD  Patient Profile     Subjective   Denies any CP, SOB or palpitations  Inpatient Medications    Scheduled Meds:  Chlorhexidine Gluconate Cloth  6 each Topical Q0600   insulin aspart  0-6 Units Subcutaneous TID WC   latanoprost  1 drop Both Eyes QHS   metoprolol tartrate  25 mg Oral BID   mupirocin ointment  1 Application Nasal BID   pantoprazole  40 mg Oral Daily   sodium chloride flush  3 mL Intravenous Q12H   Continuous Infusions:  sodium chloride 100 mL/hr at 11/06/23 0012   [START ON 11/10/2023]  ceFAZolin (ANCEF) IV     heparin 1,050 Units/hr (11/08/23 1427)   PRN Meds: acetaminophen, HYDROmorphone (DILAUDID) injection, melatonin, ondansetron (ZOFRAN) IV, oxyCODONE **OR** oxyCODONE, polyethylene glycol   Vital Signs    Vitals:   11/08/23 1706 11/08/23 2052 11/09/23 0600 11/09/23 0812  BP: (!) 101/59 (!) 105/56 103/68 113/67  Pulse: 78 69 80 87  Resp: 17 20 18 20   Temp: 98.2 F (36.8 C) 97.9 F (36.6 C) 98.3 F (36.8 C) (!) 97.3 F (36.3 C)  TempSrc: Oral Oral Oral Oral  SpO2: 98% 100% 99% 100%  Weight:      Height:        Intake/Output Summary (Last 24 hours) at 11/09/2023 0955 Last data filed at 11/09/2023 0600 Gross per 24 hour  Intake 480 ml  Output 1000 ml  Net -520 ml      11/06/2023    9:15 AM 11/05/2023    5:31 PM 10/09/2023    9:35 AM  Last 3 Weights  Weight (lbs) 160 lb 161 lb 13.1 oz 162 lb  Weight (kg) 72.576 kg 73.4 kg 73.483 kg      Telemetry    Sinus rhythm with frequent PACs and nonsustained atrial tach short runs- Personally Reviewed  ECG    No new EKG to review - Personally Reviewed  Physical Exam   GEN: No acute distress.   Neck: No JVD Cardiac: RRR with occasional ectopy, no murmurs, rubs, or gallops.  Respiratory: Clear to auscultation bilaterally. GI: Soft, nontender, non-distended  MS:  No edema; No deformity. Neuro:  Nonfocal  Psych: Normal affect   Labs    High Sensitivity Troponin:   Recent Labs  Lab 11/05/23 1834 11/05/23 2020  TROPONINIHS 22* 26*      Chemistry Recent Labs  Lab 11/05/23 2135 11/06/23 0411 11/06/23 1207 11/07/23 0345 11/08/23 0259  NA  --  135 140 134* 137  K  --  4.0 4.0 4.1 3.6  CL  --  108  --  105 103  CO2  --  19*  --  19* 21*  GLUCOSE  --  134*  --  156* 151*  BUN  --  35*  --  29* 23  CREATININE  --  1.59*  --  1.50* 1.48*  CALCIUM  --  9.5  --  9.1 9.5  PROT 8.3*  --   --  7.0  --   ALBUMIN 3.7  --   --  3.3*  --   AST 114*  --   --  183*  --   ALT 19  --   --  21  --   ALKPHOS 60  --   --  48  --   BILITOT 0.6  --   --  0.8  --   GFRNONAA  --  44*  --  47* 48*  ANIONGAP  --  8  --  10 13     Hematology Recent Labs  Lab 11/07/23 0345 11/08/23 0259 11/09/23 0334  WBC 9.4 8.6 8.6  RBC 2.99* 3.09* 2.76*  HGB 8.9* 9.1* 8.2*  HCT 26.0* 26.9* 24.5*  MCV 87.0 87.1 88.8  MCH 29.8 29.4 29.7  MCHC 34.2 33.8 33.5  RDW 13.4 13.3 13.4  PLT 182 168 160    BNPNo results for input(s): "BNP", "PROBNP" in the last 168 hours.   DDimer No results for input(s): "DDIMER" in the last 168 hours.   CHA2DS2-VASc Score = 5   This indicates a 7.2% annual risk of stroke. The patient's score is based upon: CHF History: 0 HTN History: 1 Diabetes History: 1 Stroke History: 0 Vascular Disease History: 1 Age Score: 2 Gender Score: 0      Radiology    No results found.  Patient Profile     78 y.o. male with a hx of hypertension, type 2 DM, HLD, CKD stage III who is being seen 11/08/2023 for the evaluation of atrial flutter at the request of Dr. Blake Divine.   Assessment & Plan    Newly diagnosed, persistent atrial flutter  - Patient presented to the ED complaining of right foot pain. Found to be in atrial flutter/fib with RVR. Was treated with IV dilt drip.  - Echocardiogram this admission showed EF 55-60%, no wall motion  abnormalities, normal RV function, mild MR, mild-moderate TR.  - TSH  normal. K and mag within normal limits  - started on cardizem 60 mg q6 hours but changed to Lopressor 25 mg twice daily yesterday - CHADS-VASc 5>>currently on IV heparin with plans to transition to [facility] prior to DC/when OK with vascular surgery  - If patient remains in atrial fib/flutter, could consider outpatient DCCV after 3 weeks uninterrupted AC.    Elevated Troponin  - hsTn minimally elevated on arrival 22>26 - denies any CP and EF normal on echo at 55-60% - Suspect demand ischemia in the setting of afib with RVR  - no further ischemic workup this admit>>could consider outpt given his multiple CRFs   PAD  - Managed by Vascular surgery  - Lipid panel this admission with LDL 40    Otherwise per primary  - Right popliteal artery occlusion  - PAD with bilateral SFT occlusions  - Lactic acidosis  - Rhabdomyolysis  - AKI on CKD stage IIIa    [facility] will sign off.   Medication Recommendations: Lopressor 25 mg twice daily and DOAC to be dosed per pharmacy prior to patient's discharge home Other recommendations (labs, testing, etc):  none Follow up as an outpatient:  Dr. Nelly Laurence or extender in 7-10 days after discharge  For questions or updates, please contact [facility] Please consult [email or web address] for contact info under        Signed, Armanda Magic, MD  11/09/2023, 9:55 AM

## 2023-11-10 ENCOUNTER — Other Ambulatory Visit: Payer: Self-pay

## 2023-11-10 ENCOUNTER — Inpatient Hospital Stay (HOSPITAL_COMMUNITY): Payer: Medicare PPO | Admitting: Certified Registered"

## 2023-11-10 ENCOUNTER — Encounter (HOSPITAL_COMMUNITY): Payer: Self-pay | Admitting: Internal Medicine

## 2023-11-10 ENCOUNTER — Encounter (HOSPITAL_COMMUNITY)
Admission: EM | Disposition: A | Discharge status: Home Health | Payer: Self-pay | Source: Home / Self Care | Attending: Internal Medicine

## 2023-11-10 DIAGNOSIS — I998 Other disorder of circulatory system: Secondary | ICD-10-CM | POA: Diagnosis not present

## 2023-11-10 DIAGNOSIS — I70209 Unspecified atherosclerosis of native arteries of extremities, unspecified extremity: Secondary | ICD-10-CM | POA: Diagnosis not present

## 2023-11-10 HISTORY — PX: FASCIOTOMY CLOSURE: SHX5829

## 2023-11-10 LAB — COMPREHENSIVE METABOLIC PANEL
ALT: 27 U/L (ref 0–44)
AST: 232 U/L — ABNORMAL HIGH (ref 15–41)
Albumin: 2.8 g/dL — ABNORMAL LOW (ref 3.5–5.0)
Alkaline Phosphatase: 45 U/L (ref 38–126)
Anion gap: 11 (ref 5–15)
BUN: 38 mg/dL — ABNORMAL HIGH (ref 8–23)
CO2: 22 mmol/L (ref 22–32)
Calcium: 9.4 mg/dL (ref 8.9–10.3)
Chloride: 101 mmol/L (ref 98–111)
Creatinine, Ser: 1.81 mg/dL — ABNORMAL HIGH (ref 0.61–1.24)
GFR, Estimated: 38 mL/min — ABNORMAL LOW (ref >60)
Glucose, Bld: 146 mg/dL — ABNORMAL HIGH (ref 70–99)
Potassium: 3.9 mmol/L (ref 3.5–5.1)
Sodium: 134 mmol/L — ABNORMAL LOW (ref 135–145)
Total Bilirubin: 1 mg/dL (ref 0.0–1.2)
Total Protein: 7.1 g/dL (ref 6.5–8.1)

## 2023-11-10 LAB — GLUCOSE, CAPILLARY
Glucose-Capillary: 128 mg/dL — ABNORMAL HIGH (ref 70–99)
Glucose-Capillary: 138 mg/dL — ABNORMAL HIGH (ref 70–99)
Glucose-Capillary: 128 mg/dL — ABNORMAL HIGH (ref 70–99)
Glucose-Capillary: 119 mg/dL — ABNORMAL HIGH (ref 70–99)
Glucose-Capillary: 131 mg/dL — ABNORMAL HIGH (ref 70–99)
Glucose-Capillary: 159 mg/dL — ABNORMAL HIGH (ref 70–99)

## 2023-11-10 LAB — HEPARIN LEVEL (UNFRACTIONATED): Heparin Unfractionated: 0.37 [IU]/mL (ref 0.30–0.70)

## 2023-11-10 LAB — MAGNESIUM: Magnesium: 1.8 mg/dL (ref 1.7–2.4)

## 2023-11-10 LAB — CBC
HCT: 25.4 % — ABNORMAL LOW (ref 39.0–52.0)
Hemoglobin: 8.5 g/dL — ABNORMAL LOW (ref 13.0–17.0)
MCH: 29.6 pg (ref 26.0–34.0)
MCHC: 33.5 g/dL (ref 30.0–36.0)
MCV: 88.5 fL (ref 80.0–100.0)
Platelets: 195 10*3/uL (ref 150–400)
RBC: 2.87 MIL/uL — ABNORMAL LOW (ref 4.22–5.81)
RDW: 13.4 % (ref 11.5–15.5)
WBC: 8.1 10*3/uL (ref 4.0–10.5)
nRBC: 0 % (ref 0.0–0.2)

## 2023-11-10 LAB — PHOSPHORUS: Phosphorus: 4 mg/dL (ref 2.5–4.6)

## 2023-11-10 SURGERY — FASCIOTOMY CLOSURE
Anesthesia: General | Laterality: Right

## 2023-11-10 MED ORDER — SODIUM CHLORIDE 0.9 % IV BOLUS
500.0000 mL | Freq: Once | INTRAVENOUS | Status: AC
  Administered 2023-11-10: 500 mL via INTRAVENOUS

## 2023-11-10 MED ORDER — FENTANYL CITRATE (PF) 100 MCG/2ML IJ SOLN
25.0000 ug | INTRAMUSCULAR | Status: DC | PRN
  Administered 2023-11-10: 50 ug via INTRAVENOUS
  Administered 2023-11-10 (×2): 25 ug via INTRAVENOUS

## 2023-11-10 MED ORDER — ORAL CARE MOUTH RINSE: 15.0000 mL | Freq: Once | OROMUCOSAL | Status: AC

## 2023-11-10 MED ORDER — LIDOCAINE 2% (20 MG/ML) 5 ML SYRINGE
INTRAMUSCULAR | Status: DC | PRN
  Administered 2023-11-10: 80 mg via INTRAVENOUS

## 2023-11-10 MED ORDER — FENTANYL CITRATE (PF) 100 MCG/2ML IJ SOLN
INTRAMUSCULAR | Status: AC
  Filled 2023-11-10: qty 2

## 2023-11-10 MED ORDER — OXYCODONE HCL 5 MG/5ML PO SOLN: 5.0000 mg | Freq: Once | ORAL | Status: DC | PRN

## 2023-11-10 MED ORDER — 0.9 % SODIUM CHLORIDE (POUR BTL) OPTIME
TOPICAL | Status: DC | PRN
  Administered 2023-11-10: 1000 mL

## 2023-11-10 MED ORDER — ONDANSETRON HCL 4 MG/2ML IJ SOLN: 4.0000 mg | Freq: Once | INTRAMUSCULAR | Status: DC | PRN

## 2023-11-10 MED ORDER — ESMOLOL HCL 100 MG/10ML IV SOLN
INTRAVENOUS | Status: DC | PRN
Start: 2023-11-10 — End: 2023-11-10
  Administered 2023-11-10: 20 mg via INTRAVENOUS

## 2023-11-10 MED ORDER — LACTATED RINGERS IV SOLN: INTRAVENOUS | Status: DC

## 2023-11-10 MED ORDER — EPHEDRINE 5 MG/ML INJ
INTRAVENOUS | Status: AC
  Filled 2023-11-10: qty 5

## 2023-11-10 MED ORDER — PHENYLEPHRINE 80 MCG/ML (10ML) SYRINGE FOR IV PUSH (FOR BLOOD PRESSURE SUPPORT)
PREFILLED_SYRINGE | INTRAVENOUS | Status: DC | PRN
  Administered 2023-11-10 (×2): 160 ug via INTRAVENOUS

## 2023-11-10 MED ORDER — SUCCINYLCHOLINE CHLORIDE 200 MG/10ML IV SOSY
PREFILLED_SYRINGE | INTRAVENOUS | Status: AC
  Filled 2023-11-10: qty 10

## 2023-11-10 MED ORDER — FENTANYL CITRATE (PF) 250 MCG/5ML IJ SOLN
INTRAMUSCULAR | Status: DC | PRN
  Administered 2023-11-10: 50 ug via INTRAVENOUS

## 2023-11-10 MED ORDER — ONDANSETRON HCL 4 MG/2ML IJ SOLN
INTRAMUSCULAR | Status: DC | PRN
  Administered 2023-11-10: 4 mg via INTRAVENOUS

## 2023-11-10 MED ORDER — CHLORHEXIDINE GLUCONATE 0.12 % MT SOLN: 15.0000 mL | Freq: Once | OROMUCOSAL | Status: AC

## 2023-11-10 MED ORDER — ALBUMIN HUMAN 5 % IV SOLN: INTRAVENOUS | Status: DC | PRN

## 2023-11-10 MED ORDER — CHLORHEXIDINE GLUCONATE 0.12 % MT SOLN
OROMUCOSAL | Status: AC
  Administered 2023-11-10: 15 mL via OROMUCOSAL
  Filled 2023-11-10: qty 15

## 2023-11-10 MED ORDER — ACETAMINOPHEN 10 MG/ML IV SOLN
INTRAVENOUS | Status: AC
  Filled 2023-11-10: qty 100

## 2023-11-10 MED ORDER — ROCURONIUM BROMIDE 10 MG/ML (PF) SYRINGE
PREFILLED_SYRINGE | INTRAVENOUS | Status: AC
Start: 2023-11-10 — End: ?
  Filled 2023-11-10: qty 10

## 2023-11-10 MED ORDER — OXYCODONE HCL 5 MG PO TABS: 5.0000 mg | ORAL_TABLET | Freq: Once | ORAL | Status: DC | PRN

## 2023-11-10 MED ORDER — PHENYLEPHRINE HCL-NACL 20-0.9 MG/250ML-% IV SOLN
INTRAVENOUS | Status: DC | PRN
  Administered 2023-11-10: 50 ug/min via INTRAVENOUS

## 2023-11-10 MED ORDER — LIDOCAINE 2% (20 MG/ML) 5 ML SYRINGE
INTRAMUSCULAR | Status: AC
  Filled 2023-11-10: qty 5

## 2023-11-10 MED ORDER — INSULIN ASPART 100 UNIT/ML IJ SOLN
0.0000 [IU] | INTRAMUSCULAR | Status: DC | PRN
Start: 2023-11-10 — End: 2023-11-10

## 2023-11-10 MED ORDER — ACETAMINOPHEN 10 MG/ML IV SOLN
INTRAVENOUS | Status: DC | PRN
  Administered 2023-11-10: 1000 mg via INTRAVENOUS

## 2023-11-10 MED ORDER — ONDANSETRON HCL 4 MG/2ML IJ SOLN
INTRAMUSCULAR | Status: AC
Start: 2023-11-10 — End: ?
  Filled 2023-11-10: qty 2

## 2023-11-10 MED ORDER — ACETAMINOPHEN 10 MG/ML IV SOLN: 1000.0000 mg | Freq: Once | INTRAVENOUS | Status: DC | PRN

## 2023-11-10 MED ORDER — FENTANYL CITRATE (PF) 250 MCG/5ML IJ SOLN
INTRAMUSCULAR | Status: AC
  Filled 2023-11-10: qty 5

## 2023-11-10 MED ORDER — SUCCINYLCHOLINE CHLORIDE 200 MG/10ML IV SOSY
PREFILLED_SYRINGE | INTRAVENOUS | Status: DC | PRN
  Administered 2023-11-10: 40 mg via INTRAVENOUS

## 2023-11-10 MED ORDER — PHENYLEPHRINE 80 MCG/ML (10ML) SYRINGE FOR IV PUSH (FOR BLOOD PRESSURE SUPPORT)
PREFILLED_SYRINGE | INTRAVENOUS | Status: AC
  Filled 2023-11-10: qty 10

## 2023-11-10 MED ORDER — PROPOFOL 10 MG/ML IV BOLUS
INTRAVENOUS | Status: DC | PRN
  Administered 2023-11-10: 110 mg via INTRAVENOUS

## 2023-11-10 MED ORDER — PROPOFOL 10 MG/ML IV BOLUS
INTRAVENOUS | Status: AC
  Filled 2023-11-10: qty 20

## 2023-11-10 SURGICAL SUPPLY — 37 items
BAG COUNTER SPONGE SURGICOUNT (BAG) ×1 IMPLANT
BAG ISOLATION DRAPE 18X18 (DRAPES) IMPLANT
BNDG ELASTIC 4X5.8 VLCR STR LF (GAUZE/BANDAGES/DRESSINGS) IMPLANT
BNDG ELASTIC 6X10 VLCR STRL LF (GAUZE/BANDAGES/DRESSINGS) IMPLANT
BNDG ELASTIC 6X5.8 VLCR STR LF (GAUZE/BANDAGES/DRESSINGS) IMPLANT
BNDG GAUZE DERMACEA FLUFF 4 (GAUZE/BANDAGES/DRESSINGS) IMPLANT
CANISTER SUCT 3000ML PPV (MISCELLANEOUS) ×1 IMPLANT
CLIP TI MEDIUM 6 (CLIP) ×1 IMPLANT
CLIP TI WIDE RED SMALL 6 (CLIP) ×1 IMPLANT
DRAPE BILATERAL LIMB T (DRAPES) IMPLANT
DRAPE HALF SHEET 40X57 (DRAPES) IMPLANT
DRAPE U-SHAPE 76X120 STRL (DRAPES) IMPLANT
DRSG ADAPTIC 3X8 NADH LF (GAUZE/BANDAGES/DRESSINGS) IMPLANT
ELECT REM PT RETURN 9FT ADLT (ELECTROSURGICAL) ×1
ELECTRODE REM PT RTRN 9FT ADLT (ELECTROSURGICAL) ×1 IMPLANT
GAUZE SPONGE 4X4 12PLY STRL (GAUZE/BANDAGES/DRESSINGS) ×1 IMPLANT
GAUZE SPONGE 4X4 12PLY STRL LF (GAUZE/BANDAGES/DRESSINGS) IMPLANT
GAUZE XEROFORM 5X9 LF (GAUZE/BANDAGES/DRESSINGS) IMPLANT
GLOVE BIOGEL PI IND STRL 6 (GLOVE) IMPLANT
GLOVE SURG SS PI 7.5 STRL IVOR (GLOVE) ×3 IMPLANT
GOWN STRL REUS W/ TWL LRG LVL3 (GOWN DISPOSABLE) ×2 IMPLANT
GOWN STRL REUS W/ TWL XL LVL3 (GOWN DISPOSABLE) ×1 IMPLANT
KIT BASIN OR (CUSTOM PROCEDURE TRAY) ×1 IMPLANT
KIT TURNOVER KIT B (KITS) ×1 IMPLANT
NS IRRIG 1000ML POUR BTL (IV SOLUTION) ×1 IMPLANT
PACK CV ACCESS (CUSTOM PROCEDURE TRAY) IMPLANT
PACK GENERAL/GYN (CUSTOM PROCEDURE TRAY) ×1 IMPLANT
PACK UNIVERSAL I (CUSTOM PROCEDURE TRAY) ×1 IMPLANT
PAD ARMBOARD 7.5X6 YLW CONV (MISCELLANEOUS) ×2 IMPLANT
STAPLER SKIN PROX WIDE 3.9 (STAPLE) IMPLANT
SUT ETHILON 2 0 PSLX (SUTURE) IMPLANT
SUT ETHILON 3 0 PS 1 (SUTURE) IMPLANT
SUT VIC AB 2-0 CTX 36 (SUTURE) IMPLANT
SUT VIC AB 3-0 SH 27X BRD (SUTURE) IMPLANT
SUT VICRYL 4-0 PS2 18IN ABS (SUTURE) IMPLANT
TOWEL GREEN STERILE (TOWEL DISPOSABLE) ×1 IMPLANT
WATER STERILE IRR 1000ML POUR (IV SOLUTION) ×1 IMPLANT

## 2023-11-10 NOTE — Transfer of Care (Signed)
Immediate Anesthesia Transfer of Care Note  Patient: Manuel Winters  Procedure(s) Performed: FASCIOTOMY CLOSURE RIGHT LEG (Right)  Patient Location: PACU  Anesthesia Type:General  Level of Consciousness: awake and sedated  Airway & Oxygen Therapy: Patient Spontanous Breathing and Patient connected to face mask oxygen  Post-op Assessment: Report given to RN and Post -op Vital signs reviewed and stable  Post vital signs: Reviewed and stable  Last Vitals:  Vitals Value Taken Time  BP    Temp    Pulse    Resp    SpO2      Last Pain:  Vitals:   11/10/23 0946  TempSrc:   PainSc: 3       Patients Stated Pain Goal: 4 (11/10/23 0319)  Complications: No notable events documented.

## 2023-11-10 NOTE — H&P (View-Only) (Signed)
  Progress Note    11/10/2023 6:50 AM 4 Days Post-Op  Subjective:  has some pain in the foot but better  afebrile  Vitals:   11/09/23 2338 11/10/23 0323  BP: (!) 126/58 97/66  Pulse: 99 (!) 101  Resp: 16 20  Temp: 98.6 F (37 C) 98.4 F (36.9 C)  SpO2: 98% 100%    Physical Exam: General:  no distress Cardiac:  regular Lungs:  non labored Incisions:  right groin clean and dry Extremities:  brisk right AT doppler flow; swelling in foot improved   CBC    Component Value Date/Time   WBC 8.1 11/10/2023 0312   RBC 2.87 (L) 11/10/2023 0312   HGB 8.5 (L) 11/10/2023 0312   HCT 25.4 (L) 11/10/2023 0312   PLT 195 11/10/2023 0312   MCV 88.5 11/10/2023 0312   MCH 29.6 11/10/2023 0312   MCHC 33.5 11/10/2023 0312   RDW 13.4 11/10/2023 0312   LYMPHSABS 1.5 11/05/2023 1748   MONOABS 0.7 11/05/2023 1748   EOSABS 0.0 11/05/2023 1748   BASOSABS 0.0 11/05/2023 1748    BMET    Component Value Date/Time   NA 134 (L) 11/10/2023 0312   K 3.9 11/10/2023 0312   CL 101 11/10/2023 0312   CO2 22 11/10/2023 0312   GLUCOSE 146 (H) 11/10/2023 0312   BUN 38 (H) 11/10/2023 0312   CREATININE 1.81 (H) 11/10/2023 0312   CALCIUM 9.4 11/10/2023 0312   GFRNONAA 38 (L) 11/10/2023 0312    INR    Component Value Date/Time   INR 1.1 11/06/2023 0411     Intake/Output Summary (Last 24 hours) at 11/10/2023 0650 Last data filed at 11/10/2023 0433 Gross per 24 hour  Intake 676.62 ml  Output 1125 ml  Net -448.38 ml      Assessment/Plan:  78 y.o. male is s/p:  Right CFA endarterectomy with bovine patch angiopasty, RLE thrombectomy, cutdown popliteal with tibial thrombectomy of the AT, PT, peroneal arteries and 4 compartment fasciotomies   4 Days Post-Op   -brisk right DP doppler flow -plan for OR today for fasciotomy closure.  Continue npo -DVT prophylaxis:  heparin gtt   Doreatha Massed, PA-C [facility] [phone] 11/10/2023 6:50 AM  I have seen and  evaluated the patient. I agree with the PA note as documented above.  Postop day 4 status post right common femoral endarterectomy with bovine patch including right lower extremity thrombectomy and popliteal cutdown with tibial thrombectomy and fasciotomies.  Still has a brisk AT and DP signal in the right foot.  Pain at is improved.  Ongoing motor deficit with limited to no flexion at the ankle consistent with his preop presentation.  Plan fasciotomy closure today with Dr. Randie Heinz in the OR.  Continue heparin.  NPO.  Cephus Shelling, MD [facility] [place] Office: [phone]

## 2023-11-10 NOTE — Interval H&P Note (Signed)
History and Physical Interval Note:  11/10/2023 9:48 AM  Manuel Winters  has presented today for surgery, with the diagnosis of FACIOTOMY RIGHT LEG.  The various methods of treatment have been discussed with the patient and family. After consideration of risks, benefits and other options for treatment, the patient has consented to  Procedure(s): FASCIOTOMY CLOSURE RIGHT LEG (Right) as a surgical intervention.  The patient's history has been reviewed, patient examined, no change in status, stable for surgery.  I have reviewed the patient's chart and labs.  Questions were answered to the patient's satisfaction.     Durene Cal

## 2023-11-10 NOTE — Progress Notes (Signed)
Occupational Therapy Treatment Patient Details Name: Manuel Winters MRN: [id number] DOB: 1946/02/24 Today's Date: 11/10/2023   History of present illness Pt is a 78 y/o male presenting 11/05/23 with persistent right lower extremity pain in setting of limb ischemia. Underwent R common femoral endarterectomy, thrombectomy via femoral approach, popliteal cutdown w/ tibial thrombectomy and 4 compartment fasciotomies on 1/17. PMH: HTN, DM2, HLD, CKD   OT comments  Pt remains eager to participate but remains limited by tachycardia with minimal activity and RLE pain (unable to get pain meds due to soft BP). OT session limited to EOB due to these factors with pt able to manage UB ADLs with Setup assist and LB ADLs with CGA to Mod A via lateral leans. HR up to 160s briefly with activity w/ cues for rest breaks as needed to complete ADLs as pt is asymptomatic w/ rising HR. As pt reports spouse works during the day, feel inpatient rehab stay worth considering to maximize independence and safety with ADLs/mobility. Pt hopeful to progress home but may need to consider discharging at a wheelchair level pending acute progress. Will continue to follow.  BP 90s/50s pre and post activity      If plan is discharge home, recommend the following:  A lot of help with walking and/or transfers;A lot of help with bathing/dressing/bathroom;Assistance with cooking/housework;Assist for transportation;Help with stairs or ramp for entrance   Equipment Recommendations  Other (comment);BSC/3in1;Wheelchair (measurements OT);Wheelchair cushion (measurements OT) (RW)    Recommendations for Other Services      Precautions / Restrictions Precautions Precautions: Fall;Other (comment) Precaution Comments: watch HR Restrictions Weight Bearing Restrictions Per Provider Order: No       Mobility Bed Mobility Overal bed mobility: Needs Assistance Bed Mobility: Supine to Sit, Sit to Supine     Supine to sit: Supervision, HOB  elevated, Used rails Sit to supine: Supervision   General bed mobility comments: supervision for line safety. pt also able to bridge with LLE to change bed pad underneath    Transfers                   General transfer comment: deferred due to rising HR w/ activity     Balance Overall balance assessment: Needs assistance Sitting-balance support: No upper extremity supported, Feet supported Sitting balance-Leahy Scale: Fair                                     ADL either performed or assessed with clinical judgement   ADL Overall ADL's : Needs assistance/impaired     Grooming: Set up;Sitting;Wash/dry face;Wash/dry hands;Applying deodorant Grooming Details (indicate cue type and reason): EOB Upper Body Bathing: Set up;Sitting   Lower Body Bathing: Sitting/lateral leans;Contact guard assist Lower Body Bathing Details (indicate cue type and reason): pt reported easier to bathe peri region seated due to pain (also noted rising HR with EOB ADLs so standing attempts deferred). CGA for safety in leaning to manage underwear EOB Upper Body Dressing : Set up;Sitting   Lower Body Dressing: Moderate assistance;Sitting/lateral leans;Bed level Lower Body Dressing Details (indicate cue type and reason): Total A for R sock mgmt               General ADL Comments: limited to EOB ADLs again as HR rising quickly with minimal activity. BP remained 90s/50s    Extremity/Trunk Assessment Upper Extremity Assessment Upper Extremity Assessment: Overall WFL for tasks assessed;Right hand dominant  Lower Extremity Assessment Lower Extremity Assessment: Defer to PT evaluation        Vision   Vision Assessment?: No apparent visual deficits   Perception     Praxis      Cognition Arousal: Alert Behavior During Therapy: WFL for tasks assessed/performed Overall Cognitive Status: Within Functional Limits for tasks assessed                                           Exercises      Shoulder Instructions       General Comments HR in low 100s at rest to 110s with initial activity EOB. then rising to 140s sustained with ADLs though pt reports asymptomatic. briefly 160s w/ OT cueing pt to sit statically and take a break to decrease to 130s before completion of ADLs    Pertinent Vitals/ Pain       Pain Assessment Pain Assessment: Faces Faces Pain Scale: Hurts even more Pain Location: R leg Pain Descriptors / Indicators: Grimacing, Guarding, Sore, Throbbing, Sharp Pain Intervention(s): Monitored during session, Limited activity within patient's tolerance, Other (comment) (pain meds unable to be given due to low BP - discussed with RN)  Home Living                                          Prior Functioning/Environment              Frequency  Min 1X/week        Progress Toward Goals  OT Goals(current goals can now be found in the care plan section)  Progress towards OT goals: OT to reassess next treatment  Acute Rehab OT Goals Patient Stated Goal: have procedure today, pain control OT Goal Formulation: With patient/family Time For Goal Achievement: 11/21/23 Potential to Achieve Goals: Good ADL Goals Pt Will Perform Lower Body Bathing: with supervision;sitting/lateral leans;sit to/from stand Pt Will Perform Lower Body Dressing: with supervision;sitting/lateral leans;sit to/from stand Pt Will Transfer to Toilet: with contact guard assist;ambulating  Plan      Co-evaluation                 AM-PAC OT "6 Clicks" Daily Activity     Outcome Measure   Help from another person eating meals?: None Help from another person taking care of personal grooming?: A Little Help from another person toileting, which includes using toliet, bedpan, or urinal?: A Lot Help from another person bathing (including washing, rinsing, drying)?: A Little Help from another person to put on and taking off regular upper  body clothing?: A Little Help from another person to put on and taking off regular lower body clothing?: A Lot 6 Click Score: 17    End of Session    OT Visit Diagnosis: Unsteadiness on feet (R26.81);Other abnormalities of gait and mobility (R26.89);Muscle weakness (generalized) (M62.81)   Activity Tolerance Treatment limited secondary to medical complications (Comment)   Patient Left in bed;with call bell/phone within reach   Nurse Communication Mobility status;Other (comment) (HR and BP)        Time: 1610-9604 OT Time Calculation (min): 25 min  Charges: OT General Charges $OT Visit: 1 Visit OT Treatments $Self Care/Home Management : 23-37 mins  Bradd Canary, OTR/L Acute Rehab Services Office: [phone]   Lorre Munroe 11/10/2023,  8:50 AM

## 2023-11-10 NOTE — Progress Notes (Signed)
PROGRESS NOTE    Manuel Winters  ZOX:[id number] DOB: 1946-01-30 DOA: 11/05/2023 PCP: Oletha Blend, MD   Brief Narrative: This 78 yrs old Male with PMH significant for hypertension, diabetes type 2, hyperlipidemia, CKD 3, recent ED and outpatient visits for right leg pain, who presents due to persistent symptoms. Initially reports that his pain has been ongoing for about 1 month. He was chasing a truck and tripped and fell, reports about a week later developed pain from below the knee down to the foot which is intermittent and severe. He denies any numbness or weakness in the foot but does have deficits on exam. Right leg has been cool. Pain has been progressive especially in the past week, has been trying to ambulate with the use of crutches but in the past few days not able to get up much at all. Had no known history of PAD or other ASCVD prior, not taking antiplatelets or anticoagulants prior to this admission. No history of bleeding issues. He was admitted for further evaluation.   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 PAD with bilateral SFA occlusions: Patient has chronic PAD now presented with worsening right leg symptoms. Etiology of this possibly atherothrombotic /embolic from underlying PAD v less likely cardioembolic with newly identified Aflutter.  RLE below the knee is cold with no pedal pulses on the Right, there is numbness in stocking distribution below the ankle on the R side, and strength is 3/5 with dorsiflexion at the ankle, no tissue loss.  CTA with complete occlusion at R popliteal with no runoff to the foot, additional occlusions R SFA origin + prox SFA with reconstitution, and L CFA with reconstitution and 3 vessel runoff on the left.  Vascular surgery Dr. Lenell Antu recommends for transfer to [facility] and initiation of heparin drip.   Serial neurovascular checks of the right lower extremity Adequate pain control. Patient underwent Right CFA endarterectomy with bovine patch angiopasty, RLE thrombectomy, cutdown popliteal with tibial thrombectomy of the AT, PT, peroneal arteries and 4 compartment fasciotomies 11/06/2023 by Dr. Chestine Spore  Continue Adequate Pain control.  Patient underwent medial fasciotomy and closure performed 11/09/2022.   Lactic acidosis:  Continue with IV fluids.  Lactic acid normalized.   Rhabdomyolysis:  Elevated CKD levels. Continue with IV fluids.  CK level continues to remain elevated , trend CK level.   Acute myocardial injury: No history of chest pain.  High-sensitivity troponin 22 -> 26.   EKG with a flutter with no acute ischemic changes. Likely demand event in the setting of arrhythmia, management directed at underlying A-fib above.  Persistent atrial Flutter : Patient presented with atrial flutter, started on IV diltiazem drip. Echo showed LVEF 55 to 60%, TSH normal Heart rate now controlled, Continue Lopressor 25 mg twice daily. Cardizem discontinued. CHADS2 score 5. Patient on IV heparin with plan to transition to DOAC before discharge when okay with vascular surgery. If patient continued to remain in A-fib,  Consider outpatient DCCV after 3 weeks of uninterrupted AC.  AKI on CKD stage IIIa: Improving with IV fluids.  Holding chlorthalidone and losartan.   Essential Hypertension: BP parameters have improved.  Continue metoprolol 25 mg twice daily.   DM Type 2 : Resume SSI.  Hold PO DM meds.   GERD: Continue with PPI.    Hypercalcemia: Resolved with IV fluids.      Estimated body mass  index is 24.33 kg/m as calculated from the following:   Height as of this encounter: 5\' 8"  (1.727 m).   Weight as of this encounter: 72.6 kg.  DVT prophylaxis: IV heparin Code Status: Full code Family Communication: Family at bed side. Disposition Plan:    Status is: Inpatient Remains  inpatient appropriate because: Needs further vascular intervention, PT and OT evaluation   Consultants:  Vascular surgery Cardiology  Procedures:  Antimicrobials:  Anti-infectives (From admission, onward)    Start     Dose/Rate Route Frequency Ordered Stop   11/10/23 0600  ceFAZolin (ANCEF) IVPB 1 g/50 mL premix       Note to Pharmacy: Send with pt to OR   1 g 100 mL/hr over 30 Minutes Intravenous On call 11/09/23 0802 11/10/23 1026   11/06/23 0600  penicillin g benzathine (BICILLIN LA) 1200000 UNIT/2ML injection 2.4 Million Units  Status:  Discontinued        2.4 Million Units Intramuscular  Once 11/06/23 0551 11/06/23 0551   11/06/23 0600  cefTRIAXone (ROCEPHIN) injection 500 mg  Status:  Discontinued        500 mg Intramuscular  Once 11/06/23 0551 11/06/23 0551      Subjective: Patient was seen and examined at bedside. Overnight events noted.   Patient reports feeling much improved.  Patient underwent medial fasciotomy and closure of the wound today.  Objective: Vitals:   11/10/23 1129 11/10/23 1130 11/10/23 1145 11/10/23 1159  BP: 96/64 96/64 (!) 96/59 106/67  Pulse: (!) 108 (!) 112 (!) 101 (!) 105  Resp: (!) 22 20 19 12   Temp:   98.1 F (36.7 C) 98.1 F (36.7 C)  TempSrc:    Oral  SpO2: 100% 96% 94% 100%  Weight:      Height:        Intake/Output Summary (Last 24 hours) at 11/10/2023 1346 Last data filed at 11/10/2023 1106 Gross per 24 hour  Intake 1376.62 ml  Output 880 ml  Net 496.62 ml   Filed Weights   11/05/23 1731 11/06/23 0915  Weight: 73.4 kg 72.6 kg    Examination:  General exam: Appears calm and comfortable , deconditioned, not in any acute distress. Respiratory system: CTA Bilaterally. Respiratory effort normal.  RR 14 Cardiovascular system: S1 & S2 heard, RRR. No JVD, murmurs, rubs, gallops or clicks.  Gastrointestinal system: Abdomen is non distended, soft and non tender.  Normal bowel sounds heard. Central nervous system: Alert and oriented  x 3. No focal neurological deficits. Extremities: RLE covered in dressing reports a lot of pain when moving. Skin: No rashes, lesions or ulcers Psychiatry: Judgement and insight appear normal. Mood & affect appropriate.     Data Reviewed: I have personally reviewed following labs and imaging studies  CBC: Recent Labs  Lab 11/05/23 1748 11/06/23 0411 11/06/23 1207 11/07/23 0345 11/08/23 0259 11/09/23 0334 11/10/23 0312  WBC 10.2 9.1  --  9.4 8.6 8.6 8.1  NEUTROABS 8.0*  --   --   --   --   --   --   HGB 13.3 11.9* 8.8* 8.9* 9.1* 8.2* 8.5*  HCT 39.5 35.6* 26.0* 26.0* 26.9* 24.5* 25.4*  MCV 87.6 88.3  --  87.0 87.1 88.8 88.5  PLT 244 229  --  182 168 160 195   Basic Metabolic Panel: Recent Labs  Lab 11/05/23 1831 11/05/23 1834 11/06/23 0411 11/06/23 1207 11/07/23 0345 11/08/23 0259 11/10/23 0312  NA 134*  --  135 140 134* 137 134*  K 3.9  --  4.0 4.0 4.1 3.6 3.9  CL 102  --  108  --  105 103 101  CO2 18*  --  19*  --  19* 21* 22  GLUCOSE 151*  --  134*  --  156* 151* 146*  BUN 43*  --  35*  --  29* 23 38*  CREATININE 2.19*  --  1.59*  --  1.50* 1.48* 1.81*  CALCIUM 10.4*  --  9.5  --  9.1 9.5 9.4  MG  --  1.8 1.8  --  1.7  --  1.8  PHOS  --   --  3.1  --  2.7  --  4.0   GFR: Estimated Creatinine Clearance: 32.5 mL/min (A) (by C-G formula based on SCr of 1.81 mg/dL (H)). Liver Function Tests: Recent Labs  Lab 11/05/23 2135 11/07/23 0345 11/10/23 0312  AST 114* 183* 232*  ALT 19 21 27   ALKPHOS 60 48 45  BILITOT 0.6 0.8 1.0  PROT 8.3* 7.0 7.1  ALBUMIN 3.7 3.3* 2.8*   No results for input(s): "LIPASE", "AMYLASE" in the last 168 hours. No results for input(s): "AMMONIA" in the last 168 hours. Coagulation Profile: Recent Labs  Lab 11/05/23 2202 11/06/23 0411  INR 1.1 1.1   Cardiac Enzymes: Recent Labs  Lab 11/05/23 2355 11/07/23 0345 11/08/23 0259  CKTOTAL 5,733* 12,526* 9,430*   BNP (last 3 results) No results for input(s): "PROBNP" in the last  8760 hours. HbA1C: No results for input(s): "HGBA1C" in the last 72 hours. CBG: Recent Labs  Lab 11/09/23 2214 11/10/23 0609 11/10/23 0947 11/10/23 1102 11/10/23 1203  GLUCAP 134* 128* 138* 128* 119*   Lipid Profile: No results for input(s): "CHOL", "HDL", "LDLCALC", "TRIG", "CHOLHDL", "LDLDIRECT" in the last 72 hours. Thyroid Function Tests: No results for input(s): "TSH", "T4TOTAL", "FREET4", "T3FREE", "THYROIDAB" in the last 72 hours. Anemia Panel: No results for input(s): "VITAMINB12", "FOLATE", "FERRITIN", "TIBC", "IRON", "RETICCTPCT" in the last 72 hours. Sepsis Labs: Recent Labs  Lab 11/05/23 1952 11/05/23 2135 11/08/23 0259  LATICACIDVEN 1.9 2.1* 1.1    Recent Results (from the past 240 hours)  Surgical pcr screen     Status: Abnormal   Collection Time: 11/06/23  8:44 AM   Specimen: Nasal Mucosa; Nasal Swab  Result Value Ref Range Status   MRSA, PCR NEGATIVE NEGATIVE Final   Staphylococcus aureus POSITIVE (A) NEGATIVE Final    Comment: (NOTE) The Xpert SA Assay (FDA approved for NASAL specimens in patients 75 years of age and older), is one component of a comprehensive surveillance program. It is not intended to diagnose infection nor to guide or monitor treatment. Performed at [facility], [address] [place], [place] [address]     Radiology Studies: No results found.  Scheduled Meds:  Chlorhexidine Gluconate Cloth  6 each Topical Q0600   insulin aspart  0-6 Units Subcutaneous TID WC   latanoprost  1 drop Both Eyes QHS   metoprolol tartrate  25 mg Oral BID   mupirocin ointment  1 Application Nasal BID   pantoprazole  40 mg Oral Daily   sodium chloride flush  3 mL Intravenous Q12H   Continuous Infusions:  sodium chloride 100 mL/hr at 11/10/23 0149   heparin 1,150 Units/hr (11/10/23 1244)     LOS: 5 days    Time spent: 35 mins    Willeen Niece, MD [facility]   If 7PM-7AM, please contact night-coverage

## 2023-11-10 NOTE — Op Note (Signed)
    Patient name: Manuel Winters MRN: [id number] DOB: 03-25-1946 Sex: male  11/10/2023 Pre-operative Diagnosis: Right leg ischemia Post-operative diagnosis:  Same Surgeon:  Durene Cal Assistants:  Antony Blackbird, PA Procedure:   Closure of right medial fasciotomy Anesthesia:  General Blood Loss:  minimal Specimens:  none  Findings: Medial fasciotomy closed with retention suture and staples without significant tension  Indications: This is a 78 year old gentleman who is status post right leg thrombectomy with fasciotomy.  HE comes in today for fasciotomy closure  Procedure:  The patient was identified in the holding area and taken to [facility] OR ROOM 11  The patient was then placed supine on the table. general anesthesia was administered.  The patient was prepped and draped in the usual sterile fashion.  A time out was called and antibiotics were administered.  I began by placing 2 oh vertical mattress sutures at the proximal and distal edge of the fasciotomy and then proceeded toward the center of the fasciotomy.  I was able to reapproximate the skin edges without significant tensi  I then placed staples on the skin.  Sterile dressings were applied   Disposition: To PACU stable   V. Durene Cal, M.D., [facility] [place] Office: [phone] Pager:  [phone]

## 2023-11-10 NOTE — Progress Notes (Addendum)
  Progress Note    11/10/2023 6:50 AM 4 Days Post-Op  Subjective:  has some pain in the foot but better  afebrile  Vitals:   11/09/23 2338 11/10/23 0323  BP: (!) 126/58 97/66  Pulse: 99 (!) 101  Resp: 16 20  Temp: 98.6 F (37 C) 98.4 F (36.9 C)  SpO2: 98% 100%    Physical Exam: General:  no distress Cardiac:  regular Lungs:  non labored Incisions:  right groin clean and dry Extremities:  brisk right AT doppler flow; swelling in foot improved   CBC    Component Value Date/Time   WBC 8.1 11/10/2023 0312   RBC 2.87 (L) 11/10/2023 0312   HGB 8.5 (L) 11/10/2023 0312   HCT 25.4 (L) 11/10/2023 0312   PLT 195 11/10/2023 0312   MCV 88.5 11/10/2023 0312   MCH 29.6 11/10/2023 0312   MCHC 33.5 11/10/2023 0312   RDW 13.4 11/10/2023 0312   LYMPHSABS 1.5 11/05/2023 1748   MONOABS 0.7 11/05/2023 1748   EOSABS 0.0 11/05/2023 1748   BASOSABS 0.0 11/05/2023 1748    BMET    Component Value Date/Time   NA 134 (L) 11/10/2023 0312   K 3.9 11/10/2023 0312   CL 101 11/10/2023 0312   CO2 22 11/10/2023 0312   GLUCOSE 146 (H) 11/10/2023 0312   BUN Manuel (H) 11/10/2023 0312   CREATININE 1.81 (H) 11/10/2023 0312   CALCIUM 9.4 11/10/2023 0312   GFRNONAA Manuel (L) 11/10/2023 0312    INR    Component Value Date/Time   INR 1.1 11/06/2023 0411     Intake/Output Summary (Last 24 hours) at 11/10/2023 0650 Last data filed at 11/10/2023 0433 Gross per 24 hour  Intake 676.62 ml  Output 1125 ml  Net -448.Manuel ml      Assessment/Plan:  78 y.o. Winters is s/p:  Right CFA endarterectomy with bovine patch angiopasty, RLE thrombectomy, cutdown popliteal with tibial thrombectomy of the AT, PT, peroneal arteries and 4 compartment fasciotomies   4 Days Post-Op   -brisk right DP doppler flow -plan for OR today for fasciotomy closure.  Continue npo -DVT prophylaxis:  heparin gtt   Doreatha Massed, PA-C [facility] [phone] 11/10/2023 6:50 AM  I have seen and  evaluated the patient. I agree with the PA note as documented above.  Postop day 4 status post right common femoral endarterectomy with bovine patch including right lower extremity thrombectomy and popliteal cutdown with tibial thrombectomy and fasciotomies.  Still has a brisk AT and DP signal in the right foot.  Pain at is improved.  Ongoing motor deficit with limited to no flexion at the ankle consistent with his preop presentation.  Plan fasciotomy closure today with Dr. Randie Heinz in the OR.  Continue heparin.  NPO.  Cephus Shelling, MD [facility] [place] Office: [phone]

## 2023-11-10 NOTE — Progress Notes (Signed)
PHARMACY - ANTICOAGULATION CONSULT NOTE  Pharmacy Consult for Heparin Infusion Indication:  arterial occlusion  No Known Allergies  Patient Measurements: Height: 5\' 8"  (172.7 cm) Weight: 72.6 kg (160 lb) IBW/kg (Calculated) : 68.4 Heparin Dosing Weight: 73.4 kg  Vital Signs: Temp: 98.2 F (36.8 C) (01/21 0747) Temp Source: Oral (01/21 0747) BP: 96/58 (01/21 0747) Pulse Rate: 101 (01/21 0323)  Labs: Recent Labs    11/08/23 0259 11/08/23 0915 11/09/23 0334 11/10/23 0312  HGB 9.1*  --  8.2* 8.5*  HCT 26.9*  --  24.5* 25.4*  PLT 168  --  160 195  HEPARINUNFRC 0.53 0.47 0.37 0.37  CREATININE 1.48*  --   --  1.81*  CKTOTAL 9,430*  --   --   --     Estimated Creatinine Clearance: 32.5 mL/min (A) (by C-G formula based on SCr of 1.81 mg/dL (H)).   Medical History: Past Medical History:  Diagnosis Date   Hypertension    Assessment: Patient is a 78yo male with new aflutter and s/p right CFA endarterectomy with bovine patch angiopasty, RLE thrombectomy, cutdown popliteal with tibial thrombectomy of the AT, PT, peroneal arteries and 4 compartment fasciotomies 1/17. No anticoagulation prior to admission. Pharmacy consulted for heparin.    Heparin level 0.37 is at low end of therapeutic on 1100 units/hr. Level has been trending down.  Return to OR 1/21 for fasciotomy closure.     Goal of Therapy:  Heparin level 0.3-0.7 units/ml Monitor platelets by anticoagulation protocol: Yes   Plan:  Increase heparin 1150 units/hr Monitor daily heparin level, CBC, signs/symptoms of bleeding  F/u switch to apixaban   Alphia Moh, PharmD, BCPS, BCCP Clinical Pharmacist  Please check AMION for all [facility] Pharmacy phone numbers After 10:00 PM, call Main Pharmacy [phone]

## 2023-11-10 NOTE — Anesthesia Procedure Notes (Signed)
Procedure Name: Intubation Date/Time: 11/10/2023 10:21 AM  Performed by: Alwyn Ren, CRNAPre-anesthesia Checklist: Patient identified, Emergency Drugs available, Suction available and Patient being monitored Patient Re-evaluated:Patient Re-evaluated prior to induction Oxygen Delivery Method: Circle system utilized Preoxygenation: Pre-oxygenation with 100% oxygen Induction Type: IV induction Ventilation: Mask ventilation without difficulty Laryngoscope Size: Miller and 2 Grade View: Grade I Tube type: Oral Tube size: 7.5 mm Number of attempts: 1 Airway Equipment and Method: Stylet and Oral airway Placement Confirmation: ETT inserted through vocal cords under direct vision, positive ETCO2 and breath sounds checked- equal and bilateral Secured at: 22 cm Tube secured with: Tape Dental Injury: Teeth and Oropharynx as per pre-operative assessment

## 2023-11-10 NOTE — Anesthesia Preprocedure Evaluation (Signed)
Anesthesia Evaluation  Patient identified by MRN, date of birth, ID band Patient awake    Reviewed: Allergy & Precautions, NPO status , Patient's Chart, lab work & pertinent test results, reviewed documented beta blocker date and time   History of Anesthesia Complications Negative for: history of anesthetic complications  Airway Mallampati: II  TM Distance: >3 FB     Dental  (+) Edentulous Upper, Edentulous Lower   Pulmonary neg COPD, neg PE   breath sounds clear to auscultation       Cardiovascular Exercise Tolerance: Poor hypertension, + Peripheral Vascular Disease   Rhythm:Regular Rate:Normal     Neuro/Psych neg Seizures    GI/Hepatic ,neg GERD  ,,(+) neg Cirrhosis        Endo/Other  diabetes, Type 2    Renal/GU Renal disease     Musculoskeletal   Abdominal   Peds  Hematology   Anesthesia Other Findings   Reproductive/Obstetrics                             Anesthesia Physical Anesthesia Plan  ASA: 3  Anesthesia Plan: General   Post-op Pain Management:    Induction: Intravenous  PONV Risk Score and Plan: 2 and Ondansetron and Dexamethasone  Airway Management Planned: Oral ETT  Additional Equipment:   Intra-op Plan:   Post-operative Plan: Extubation in OR  Informed Consent: I have reviewed the patients History and Physical, chart, labs and discussed the procedure including the risks, benefits and alternatives for the proposed anesthesia with the patient or authorized representative who has indicated his/her understanding and acceptance.     Dental advisory given  Plan Discussed with: CRNA  Anesthesia Plan Comments:        Anesthesia Quick Evaluation

## 2023-11-11 ENCOUNTER — Encounter (HOSPITAL_COMMUNITY): Payer: Self-pay | Admitting: Surgery

## 2023-11-11 DIAGNOSIS — I70209 Unspecified atherosclerosis of native arteries of extremities, unspecified extremity: Secondary | ICD-10-CM | POA: Diagnosis not present

## 2023-11-11 LAB — BASIC METABOLIC PANEL
Anion gap: 10 (ref 5–15)
BUN: 29 mg/dL — ABNORMAL HIGH (ref 8–23)
CO2: 22 mmol/L (ref 22–32)
Calcium: 8.9 mg/dL (ref 8.9–10.3)
Chloride: 104 mmol/L (ref 98–111)
Creatinine, Ser: 1.49 mg/dL — ABNORMAL HIGH (ref 0.61–1.24)
GFR, Estimated: 48 mL/min — ABNORMAL LOW (ref >60)
Glucose, Bld: 124 mg/dL — ABNORMAL HIGH (ref 70–99)
Potassium: 3.7 mmol/L (ref 3.5–5.1)
Sodium: 136 mmol/L (ref 135–145)

## 2023-11-11 LAB — GLUCOSE, CAPILLARY
Glucose-Capillary: 154 mg/dL — ABNORMAL HIGH (ref 70–99)
Glucose-Capillary: 134 mg/dL — ABNORMAL HIGH (ref 70–99)
Glucose-Capillary: 131 mg/dL — ABNORMAL HIGH (ref 70–99)
Glucose-Capillary: 135 mg/dL — ABNORMAL HIGH (ref 70–99)

## 2023-11-11 LAB — CBC
HCT: 22.6 % — ABNORMAL LOW (ref 39.0–52.0)
Hemoglobin: 7.6 g/dL — ABNORMAL LOW (ref 13.0–17.0)
MCH: 29.9 pg (ref 26.0–34.0)
MCHC: 33.6 g/dL (ref 30.0–36.0)
MCV: 89 fL (ref 80.0–100.0)
Platelets: 203 10*3/uL (ref 150–400)
RBC: 2.54 MIL/uL — ABNORMAL LOW (ref 4.22–5.81)
RDW: 13.5 % (ref 11.5–15.5)
WBC: 7.8 10*3/uL (ref 4.0–10.5)
nRBC: 0 % (ref 0.0–0.2)

## 2023-11-11 LAB — CK: Total CK: 3896 U/L — ABNORMAL HIGH (ref 49–397)

## 2023-11-11 LAB — PHOSPHORUS: Phosphorus: 2.7 mg/dL (ref 2.5–4.6)

## 2023-11-11 LAB — MAGNESIUM: Magnesium: 1.6 mg/dL — ABNORMAL LOW (ref 1.7–2.4)

## 2023-11-11 LAB — HEPARIN LEVEL (UNFRACTIONATED): Heparin Unfractionated: 0.25 [IU]/mL — ABNORMAL LOW (ref 0.30–0.70)

## 2023-11-11 MED ORDER — SENNOSIDES-DOCUSATE SODIUM 8.6-50 MG PO TABS
1.0000 | ORAL_TABLET | Freq: Two times a day (BID) | ORAL | Status: DC
  Administered 2023-11-11 – 2023-11-17 (×10): 1 via ORAL
  Filled 2023-11-11 (×12): qty 1

## 2023-11-11 MED ORDER — PHENOL 1.4 % MT LIQD
1.0000 | OROMUCOSAL | Status: DC | PRN
  Filled 2023-11-11: qty 177

## 2023-11-11 MED ORDER — BISACODYL 10 MG RE SUPP: 10.0000 mg | Freq: Every day | RECTAL | Status: DC | PRN

## 2023-11-11 MED ORDER — POLYETHYLENE GLYCOL 3350 17 G PO PACK
17.0000 g | PACK | Freq: Two times a day (BID) | ORAL | Status: DC
  Administered 2023-11-12 – 2023-11-17 (×10): 17 g via ORAL
  Filled 2023-11-11 (×11): qty 1

## 2023-11-11 MED ORDER — MAGNESIUM SULFATE 2 GM/50ML IV SOLN
2.0000 g | Freq: Once | INTRAVENOUS | Status: AC
  Administered 2023-11-11: 2 g via INTRAVENOUS
  Filled 2023-11-11: qty 50

## 2023-11-11 NOTE — Progress Notes (Signed)
Mobility Specialist Progress Note:   11/11/23 1520  Mobility  Activity Ambulated with assistance in room  Level of Assistance Minimal assist, patient does 75% or more  Assistive Device Front wheel walker  Distance Ambulated (ft) 12 ft  Activity Response Tolerated well  Mobility Referral Yes  Mobility visit 1 Mobility  Mobility Specialist Start Time (ACUTE ONLY) 1412  Mobility Specialist Stop Time (ACUTE ONLY) 1426  Mobility Specialist Time Calculation (min) (ACUTE ONLY) 14 min   Pt received in bed agreeable to mobility. Was able to perform bed mobility independently but required MinA for STST and ambulation. Was able to ambulate 4' forwards and 4 backwards. Took a seated break, HR stayed between 70-107 throughout session. Then was able to take 3 hops forward and 3 hops backwards. Was unable to put much weight on their RLE d/t strong pain. Situated back in bed w/ call bell and personal belongings in reach. All needs met.   Thompson Grayer Mobility Specialist  Please contact vis Secure Chat or  Rehab Office [phone]

## 2023-11-11 NOTE — Progress Notes (Signed)
Patient had a period of rapid HR up to 170, lasting for 2 mins. Vitals checked BP 120s systolic, patient not aware of HR stated he felt normal. PO Scheduled metoprolol given. Dr. Marland Mcalpine notified. HR returned to lower 110s

## 2023-11-11 NOTE — Anesthesia Postprocedure Evaluation (Signed)
Anesthesia Post Note  Patient: Manuel Winters  Procedure(s) Performed: FASCIOTOMY CLOSURE RIGHT LEG (Right)     Patient location during evaluation: PACU Anesthesia Type: General Level of consciousness: awake and alert Pain management: pain level controlled Vital Signs Assessment: post-procedure vital signs reviewed and stable Respiratory status: spontaneous breathing, nonlabored ventilation and respiratory function stable Cardiovascular status: blood pressure returned to baseline and stable Postop Assessment: no apparent nausea or vomiting Anesthetic complications: no   No notable events documented.  Last Vitals:    Last Pain:                 Collene Schlichter

## 2023-11-11 NOTE — Progress Notes (Signed)
Physical Therapy Treatment Patient Details Name: Manuel Winters MRN: [id number] DOB: 04/10/1946 Today's Date: 11/11/2023   History of Present Illness Pt is a 78 y/o male presenting 11/05/23 with persistent right lower extremity pain in setting of limb ischemia. Underwent R common femoral endarterectomy, thrombectomy via femoral approach, popliteal cutdown w/ tibial thrombectomy and 4 compartment fasciotomies on 1/17. PMH: HTN, DM2, HLD, CKD    PT Comments  Patient continues with limited activity tolerance and pain esp with dependency of R LE and keeping foot up during in room ambulation though placing it some with cues.  Able to stand better with cues for hand placement and prefers to manage his leg on his own.  He has support from family here and at home.  Agree with home with HHPT.  Hopeful to progress distance with ambulation though may need transport w/c at d/c if not progressing.  PT will continue to follow.     If plan is discharge home, recommend the following: A little help with walking and/or transfers;A little help with bathing/dressing/bathroom;Assistance with cooking/housework;Help with stairs or ramp for entrance;Assist for transportation   Can travel by private vehicle        Equipment Recommendations  Rolling walker (2 wheels);BSC/3in1 (may need transport w/c)    Recommendations for Other Services       Precautions / Restrictions Precautions Precautions: Fall Precaution Comments: watch HR     Mobility  Bed Mobility Overal bed mobility: Needs Assistance Bed Mobility: Supine to Sit, Sit to Supine     Supine to sit: Contact guard Sit to supine: Supervision   General bed mobility comments: increased time, initial assist for R LE, though pt requesting to move himself due to pain    Transfers Overall transfer level: Needs assistance Equipment used: Rolling walker (2 wheels) Transfers: Sit to/from Stand Sit to Stand: Min assist           General transfer  comment: up to stand with A for balance and cues for hand placement    Ambulation/Gait Ambulation/Gait assistance: Contact guard assist, Min assist Gait Distance (Feet): 20 Feet Assistive device: Rolling walker (2 wheels) Gait Pattern/deviations: Trunk flexed, Step-to pattern, Decreased stride length, Antalgic       General Gait Details: maintaining NWB on R LE till encouraged to try at least touch down which he did some during session, flexed posture throughout   Stairs             Wheelchair Mobility     Tilt Bed    Modified Rankin (Stroke Patients Only)       Balance Overall balance assessment: Needs assistance   Sitting balance-Leahy Scale: Good     Standing balance support: Bilateral upper extremity supported Standing balance-Leahy Scale: Poor Standing balance comment: maintaining NWB to very little weight on R LE                            Cognition Arousal: Alert Behavior During Therapy: Anxious Overall Cognitive Status: Within Functional Limits for tasks assessed                                 General Comments: anxious regarding pain        Exercises      General Comments General comments (skin integrity, edema, etc.): HR up to 110's with mobility, family in the room and voice plans to take  pt home at d/c.  Noted oozing bloody drainage from R medial lower leg incision      Pertinent Vitals/Pain Pain Assessment Faces Pain Scale: Hurts whole lot Pain Location: R leg Pain Descriptors / Indicators: Grimacing, Guarding, Sore, Throbbing, Sharp Pain Intervention(s): Monitored during session, Limited activity within patient's tolerance, Repositioned    Home Living                          Prior Function            PT Goals (current goals can now be found in the care plan section) Progress towards PT goals: Progressing toward goals    Frequency    Min 1X/week      PT Plan      Co-evaluation               AM-PAC PT "6 Clicks" Mobility   Outcome Measure  Help needed turning from your back to your side while in a flat bed without using bedrails?: A Little Help needed moving from lying on your back to sitting on the side of a flat bed without using bedrails?: A Little Help needed moving to and from a bed to a chair (including a wheelchair)?: A Little Help needed standing up from a chair using your arms (e.g., wheelchair or bedside chair)?: A Little Help needed to walk in hospital room?: A Little Help needed climbing 3-5 steps with a railing? : Total 6 Click Score: 16    End of Session Equipment Utilized During Treatment: Gait belt Activity Tolerance: Patient limited by pain Patient left: in bed;with call bell/phone within reach   PT Visit Diagnosis: Unsteadiness on feet (R26.81);Other abnormalities of gait and mobility (R26.89);Muscle weakness (generalized) (M62.81);Difficulty in walking, not elsewhere classified (R26.2);Pain Pain - part of body: Leg     Time: 5366-4403 PT Time Calculation (min) (ACUTE ONLY): 14 min  Charges:    $Gait Training: 8-22 mins PT General Charges $$ ACUTE PT VISIT: 1 Visit                     Manuel Winters, PT Acute Rehabilitation Services Office:[phone] 11/11/2023    Manuel Winters 11/11/2023, 5:57 PM

## 2023-11-11 NOTE — Progress Notes (Addendum)
  Progress Note    11/11/2023 6:38 AM 1 Day Post-Op  Subjective:  says his leg feels better.  Able to lift it off bed better as well.  Still can't really move his toes.   Tm 99.5  Vitals:   11/11/23 0022 11/11/23 0309  BP: 96/64 96/62  Pulse: 90 88  Resp: 20 15  Temp: 99.4 F (37.4 C) 99.5 F (37.5 C)  SpO2: 100% 100%    Physical Exam: General:  no distress Cardiac:  regular Lungs:  non labored Incisions:  right lower leg incisions look good  Right medial leg   Right lateral leg   Extremities:  + doppler flow right AT; swelling improved  CBC    Component Value Date/Time   WBC 7.8 11/11/2023 0304   RBC 2.54 (L) 11/11/2023 0304   HGB 7.6 (L) 11/11/2023 0304   HCT 22.6 (L) 11/11/2023 0304   PLT 203 11/11/2023 0304   MCV 89.0 11/11/2023 0304   MCH 29.9 11/11/2023 0304   MCHC 33.6 11/11/2023 0304   RDW 13.5 11/11/2023 0304   LYMPHSABS 1.5 11/05/2023 1748   MONOABS 0.7 11/05/2023 1748   EOSABS 0.0 11/05/2023 1748   BASOSABS 0.0 11/05/2023 1748    BMET    Component Value Date/Time   NA 136 11/11/2023 0304   K 3.7 11/11/2023 0304   CL 104 11/11/2023 0304   CO2 22 11/11/2023 0304   GLUCOSE 124 (H) 11/11/2023 0304   BUN 29 (H) 11/11/2023 0304   CREATININE 1.49 (H) 11/11/2023 0304   CALCIUM 8.9 11/11/2023 0304   GFRNONAA 48 (L) 11/11/2023 0304    INR    Component Value Date/Time   INR 1.1 11/06/2023 0411     Intake/Output Summary (Last 24 hours) at 11/11/2023 1610 Last data filed at 11/11/2023 0420 Gross per 24 hour  Intake 3361.68 ml  Output 605 ml  Net 2756.68 ml      Assessment/Plan:  78 y.o. male is s/p:  Right CFA endarterectomy with bovine patch angiopasty, RLE thrombectomy, cutdown popliteal with tibial thrombectomy of the AT, PT, peroneal arteries and 4 compartment fasciotomies 11/06/2023 by Dr. Chestine Spore and closure of right medial fasciotomy 11/10/2023 by Dr. Myra Gianotti 1 Day Post-Op   -pt doing well this morning with + doppler flow right  AT.  Dressing right leg changed this morning and looks good as pictured above.  -acute blood loss anemia-hgb 7.6 down from 8.5 yesterday.  Seems to be tolerating.  No bleeding from lower leg incisions.  Check labs tomorrow.  -continue to mobilize out of bed. -DVT prophylaxis:  heparin gtt   Doreatha Massed, PA-C [facility] [phone] 11/11/2023 6:38 AM  I have seen and evaluated the patient. I agree with the PA note as documented above.  Patient underwent right leg fasciotomy closure yesterday.  Today only get a peroneal signal at the ankle but this is very brisk.  His foot is warm.  He has no symptoms.  Continue to mobilize and work with therapy.  Right motor deficit consistent with preop presentation at time of ischemic event.  Cephus Shelling, MD [facility] [place] Office: [phone]

## 2023-11-11 NOTE — Hospital Course (Addendum)
This 78 yrs old Male with PMH significant for hypertension, diabetes type 2, hyperlipidemia, CKD 3, recent ED and outpatient visits for right leg pain, who presents due to persistent symptoms. Initially reports that his pain has been ongoing for about 1 month. He was chasing a truck and tripped and fell, reports about a week later developed pain from below the knee down to the foot which is intermittent and severe. He denies any numbness or weakness in the foot but does have deficits on exam. Right leg has been cool. Pain has been progressive especially in the past week, has been trying to ambulate with the use of crutches but in the past few days not able to get up much at all. Had no known history of PAD or other ASCVD prior, not taking antiplatelets or anticoagulants prior to this admission. No history of bleeding issues. He was admitted for further evaluation.   **Interim History Subsequently he underwent a right CFA endarterectomy with bovine patch angioplasty, right lower extremity thrombectomy abdominal popliteal and tibial thrombectomy of the AT, PT and peroneal arteries and 4 compartment fasciotomy with subsequent medial fasciotomy and closure performed on 11/09/2022.  *11/12/23: Blood Count dropped overnight so he will be typed and screend and transfused 1 unit of pRBC's. Having quite a bit of pain so will need to continue Analgesics. PT/OT Recommending Home Health.   On 11/13/2023 is doing much better.  Unfortunately spiked a slight temperature so we will obtain blood cultures, chest x-ray, urinalysis and obtain lactic acid level and procalcitonin level in the morning.  Yesterday he had some pain but doing otherwise fairly well. CK continues to trend down and likely plateau' ed. Today he is doing ok but still having Pain. Anticipating D/C in the next 24 hours now that he has been afebrile for 2 days and because CK is nearly normalized.   Assessment and Plan:  Right popliteal artery occlusion  suspect subacute: Right foot drop, Severe ischemic injury. PAD with bilateral SFA occlusions: -Patient has chronic PAD now presented with worsening right leg symptoms. -Etiology of this possibly atherothrombotic /embolic from underlying PAD v less likely cardioembolic with newly identified Aflutter.  RLE below the knee is cold with no pedal pulses on the Right, there is numbness in stocking distribution below the ankle on the R side, and strength is 3/5 with dorsiflexion at the ankle, no tissue loss.  -CTA with complete occlusion at R popliteal with no runoff to the foot, additional occlusions R SFA origin + prox SFA with reconstitution, and L CFA with reconstitution and 3 vessel runoff on the left.  -Vascular surgery Dr. Lenell Antu recommends for transfer to [facility] and initiation of heparin drip.  -Drip was continued and now being changed to DOAC at the direction of the vascular surgery team -Serial neurovascular checks of the right lower extremity -Adequate pain control. -Patient underwent Right CFA endarterectomy with bovine patch angiopasty, RLE thrombectomy, cutdown popliteal with tibial thrombectomy of the AT, PT, peroneal arteries and 4 compartment fasciotomies 11/06/2023 by Dr. Chestine Spore  -Continue Adequate Pain control.  -Subsequently patient underwent medial fasciotomy and closure performed 11/09/2022. -Pain control po Acetaminophen 1000 mg po q6hprn Mild Pain, IV Hydromorphone 0.5 mg q4hprn Breakthrough Pain; C/w Oxycodone IR 5 mg q4hprn Moderate Pain or 10 mg po q4hprn Severe Pain  -PT recommending Home Health and OT recommending SNF -Vascular evaluated and he continues to have brisk Right peroneal doppler signal -Continue to Monitor and continue to mobilize with therapy -Patient continues  to have pain in the right lower extremity along with motor deficits consistent with his initial presentation due to prolonged ischemia and PT OT recommends home health at this point and anticipating D/C in  the next 24 hours   Lactic Acidosis:  -Continue with IV fluids.  -Lactic acid normalized. Recent Labs  Lab 11/05/23 1952 11/05/23 2135 11/08/23 0259 11/14/23 0304  LATICACIDVEN 1.9 2.1* 1.1 0.8    Rhabdomyolysis:  -Elevated CKD levels. Continued with IV fluids with NS at 75 mL/hr -CK Level trend went from 5733 -> 12,526 -> 9430 -> 3896 -> 1646 -> 776 -> 459 -> 477 -CK level continues to remain elevated but almost normalized, trend CK level.   Acute myocardial injury: -No history of chest pain.  High-sensitivity troponin 22 -> 26.   -EKG with a flutter with no acute ischemic changes. -Likely demand event in the setting of arrhythmia, management directed at underlying A-fib above.   Constipation -Initiated Bowel regimen 17 grams po BID, Senna-Docusate 1 tab po BID, and Bisacodyl 10 mg RC Daily PRN Moderate Constipation   Persistent Atrial Flutter -Patient presented with atrial flutter, started on IV diltiazem drip. -Echo showed LVEF 55 to 60%, TSH normal -Heart rate now controlled, Continue Lopressor 25 mg twice daily. -Cardizem discontinued. -CHADS2 score 5. Patient on IV heparin with plan to transition to DOAC before discharge when okay with vascular surgery. -If patient continued to remain in A-fib,  Consider outpatient DCCV after 3 weeks of uninterrupted AC. -Cardiology has signed off the case now but if necessary will reconsult if necessary    AKI on CKD stage IIIa Metabolic Acidosis -IVF now stopped -Continue Holding chlorthalidone and losartan.  -BUN/Cr Trend: Recent Labs  Lab 11/08/23 0259 11/10/23 0312 11/11/23 0304 11/12/23 0312 11/13/23 0326 11/14/23 0304 11/15/23 0253  BUN 23 38* 29* 14 11 8  6*  CREATININE 1.48* 1.81* 1.49* 1.19 1.17 1.12 1.27*  -Had a Slight Metabolic Acidosis but now improved and CO2 is 22, Chloride Level of 111, and AG of 5 -Avoid Nephrotoxic Medications, Contrast Dyes, Hypotension and Dehydration to Ensure Adequate Renal Perfusion and  will need to Renally Adjust Meds -Continue to Monitor and Trend Renal Function carefully and repeat CMP in the AM   ABLA/Dilutional Drop -Hgb/Hct Trend: Recent Labs  Lab 11/09/23 0334 11/10/23 0312 11/11/23 0304 11/12/23 0312 11/13/23 0326 11/14/23 0304 11/15/23 0253  HGB 8.2* 8.5* 7.6* 6.3* 8.4* 8.1* 7.9*  HCT 24.5* 25.4* 22.6* 19.6* 25.3* 24.1* 23.6*  MCV 88.8 88.5 89.0 93.8 90.7 88.9 89.7  -Checked Anemia Panel and showed and Iron Level of 44, UBIC of 181, TIBC of 225, Saturation Ratio of 20, Ferritin Level of 251, Folate Level of 12.5, and 707 -Typed and Screend and Transfused 1 unit pRBCs given that Hgb dropped to 6.3 -Continue to Monitor for S/Sx of Bleeding; No overt bleeding ntoed -Repeat CBC in the AM   Fever, improved  -Unclear Etiology and spiked a Temp of 100.4 the day before yesterday -Check Blood Cx x2 and showing NGTD at 2 Days -Checked CXR and showed no active disease  -Check U/A and unremarkable -Checked procalcitonin level and was <0.10 and lactic acid level was 0.8 -WBC Trend: Recent Labs  Lab 11/09/23 0334 11/10/23 0312 11/11/23 0304 11/12/23 0312 11/13/23 0326 11/14/23 0304 11/15/23 0253  WBC 8.6 8.1 7.8 7.2 9.0 7.5 6.8  -Continue to Monitor and Trend and repeat CBC in the AM -Hold Abx at this point but low threshold to initiate; Fever resolved  Essential Hypertension: -BP parameters have improved.  -Continue Metoprolol Tartrate 25 mg twice daily. -Continue to Monitor BP per Protocol -Last BP reading was 120/71   DM Type 2  -Resume SSI with very sensitive NovoLog sliding scale AC -HbA1c was 7.2 -Hold PO DM meds. -Continue monitor CBG trend: Recent Labs  Lab 11/13/23 2150 11/14/23 0617 11/14/23 1229 11/14/23 1703 11/14/23 2115 11/15/23 0615 11/15/23 1221  GLUCAP 114* 121* 139* 110* 100* 115* 135*   Hypokalemia -Patient's K+ Level Trend: Recent Labs  Lab 11/08/23 0259 11/10/23 0312 11/11/23 0304 11/12/23 0312 11/13/23 0326  11/14/23 0304 11/15/23 0253  K 3.6 3.9 3.7 3.7 4.1 3.3* 3.8  -Replete with po KCL 40 mEQ BID yesterday  -Continue to Monitor and Replete as Necessary -Repeat CMP in the AM   Hypomagnesemia -Patient's Mag Level Trend: Recent Labs  Lab 11/07/23 0345 11/10/23 0312 11/11/23 0304 11/12/23 0312 11/13/23 0326 11/14/23 0304 11/15/23 0253  MG 1.7 1.8 1.6* 1.8 2.0 1.6* 1.8  -Replete with IV Mag Sulfate 2 grams again -Continue to Monitor and Replete as Necessary -Repeat Mag in the AM    Hypercalcemia, improved -Resolved with IV fluids and then was mildly hypocalcemic.  -Calcium Level Trend: Recent Labs  Lab 11/08/23 0259 11/10/23 0312 11/11/23 0304 11/12/23 0312 11/13/23 0326 11/14/23 0304 11/15/23 0253  CALCIUM 9.5 9.4 8.9 8.2* 9.1 8.8* 8.9  -Continue to Monitor and Trend and repeat CMP in the AM   GERD/GI Prophylaxis -Continue with PPI with Pantoprazole 40 mg po daily  Hypoalbuminemia -Patient's Albumin Trend: Recent Labs  Lab 11/05/23 2135 11/07/23 0345 11/10/23 0312 11/12/23 0312 11/13/23 0326 11/14/23 0304 11/15/23 0253  ALBUMIN 3.7 3.3* 2.8* 2.7* 2.9* 2.6* 2.5*  -Continue to Monitor and Trend and repeat CMP in the AM

## 2023-11-11 NOTE — Progress Notes (Signed)
PROGRESS NOTE    PERETZ RODAS  ZOX:[id number] DOB: [birth year] DOA: 11/05/2023 PCP: Oletha Blend, MD   Brief Narrative:  This 78 yrs old Male with PMH significant for hypertension, diabetes type 2, hyperlipidemia, CKD 3, recent ED and outpatient visits for right leg pain, who presents due to persistent symptoms. Initially reports that his pain has been ongoing for about 1 month. He was chasing a truck and tripped and fell, reports about a week later developed pain from below the knee down to the foot which is intermittent and severe. He denies any numbness or weakness in the foot but does have deficits on exam. Right leg has been cool. Pain has been progressive especially in the past week, has been trying to ambulate with the use of crutches but in the past few days not able to get up much at all. Had no known history of PAD or other ASCVD prior, not taking antiplatelets or anticoagulants prior to this admission. No history of bleeding issues. He was admitted for further evaluation.   **Interim History Subsequently he underwent a right CFA endarterectomy with bovine patch angioplasty, right lower extremity thrombectomy abdominal popliteal and tibial thrombectomy of the AT, PT and peroneal arteries and 4 compartment fasciotomy with subsequent medial fasciotomy and closure performed on 11/09/2022.  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 PAD with bilateral SFA occlusions: -Patient has chronic PAD now presented with worsening right leg symptoms. -Etiology of this possibly atherothrombotic /embolic from underlying PAD v less likely cardioembolic with newly identified Aflutter.  RLE below the knee is cold with no pedal pulses on the Right, there is numbness in stocking distribution below the ankle on the R side, and strength is 3/5 with dorsiflexion at the ankle, no tissue loss.  -CTA with complete occlusion at R popliteal with  no runoff to the foot, additional occlusions R SFA origin + prox SFA with reconstitution, and L CFA with reconstitution and 3 vessel runoff on the left.  -Vascular surgery Dr. Lenell Antu recommends for transfer to [facility] and initiation of heparin drip.  -Serial neurovascular checks of the right lower extremity -Adequate pain control. -Patient underwent Right CFA endarterectomy with bovine patch angiopasty, RLE thrombectomy, cutdown popliteal with tibial thrombectomy of the AT, PT, peroneal arteries and 4 compartment fasciotomies 11/06/2023 by Dr. Chestine Spore  -Continue Adequate Pain control.  -Subsequently patient underwent medial fasciotomy and closure performed 11/09/2022. -Pain control po Acetaminophen 1000 mg po q6hprn Mild Pain, IV Hydromorphone 0.5 mg q4hprn Breakthrough Pain; C/w Oxycodone IR 5 mg q4hprn Moderate Pain or 10 mg po q4hprn Severe Pain    Lactic Acidosis:  -Continue with IV fluids.  -Lactic acid normalized. Recent Labs  Lab 11/05/23 1952 11/05/23 2135 11/08/23 0259  LATICACIDVEN 1.9 2.1* 1.1    Rhabdomyolysis:  -Elevated CKD levels. Continue with IV fluids with NS at 100 mL/hr -CK Level trend went from 5733 -> 12,526 -> 9430 -> 3896 -CK level continues to remain elevated , trend CK level.   Acute myocardial injury: -No history of chest pain.  High-sensitivity troponin 22 -> 26.   -EKG with a flutter with no acute ischemic changes. -Likely demand event in the setting of arrhythmia, management directed at underlying A-fib above. -CK was significantly elevated at 3896   Constipation -Initiated Bowel regimen 17 grams po BID, Senna-Docusate 1 tab po BID, and Bisacodyl 10 mg RC Daily PRN Moderate Constipation   Persistent atrial Flutter : -Patient presented  with atrial flutter, started on IV diltiazem drip. -Echo showed LVEF 55 to 60%, TSH normal -Heart rate now controlled, Continue Lopressor 25 mg twice daily. -Cardizem discontinued. -CHADS2 score 5. Patient on IV heparin  with plan to transition to DOAC before discharge when okay with vascular surgery. -If patient continued to remain in A-fib,  Consider outpatient DCCV after 3 weeks of uninterrupted AC. -Cardiology has signed off the case now but if necessary will reconsult   AKI on CKD stage IIIa: -Improving with IV fluids.  -Continue Holding chlorthalidone and losartan.  -BUN/Cr Trend: Recent Labs  Lab 11/05/23 1831 11/06/23 0411 11/07/23 0345 11/08/23 0259 11/10/23 0312 11/11/23 0304  BUN 43* 35* 29* 23 38* 29*  CREATININE 2.19* 1.59* 1.50* 1.48* 1.81* 1.49*  -Avoid Nephrotoxic Medications, Contrast Dyes, Hypotension and Dehydration to Ensure Adequate Renal Perfusion and will need to Renally Adjust Meds -Continue to Monitor and Trend Renal Function carefully and repeat CMP in the AM   Essential Hypertension: -BP parameters have improved.  -Continue Metoprolol Tartrate 25 mg twice daily. -Continue to Monitor BP per Protocol -Last BP reading was    DM Type 2  -Resume SSI with very sensitive NovoLog sliding scale AC -HbA1c was 7.2 -Hold PO DM meds. -Continue monitor CBG trend: Recent Labs  Lab 11/10/23 1102 11/10/23 1203 11/10/23 1651 11/10/23 2133 11/11/23 0556 11/11/23 1144 11/11/23 1710  GLUCAP 128* 119* 131* 159* 154* 134* 131*   Hypomagnesemia -Patient's Mag Level Trend: Recent Labs  Lab 11/05/23 1834 11/06/23 0411 11/07/23 0345 11/10/23 0312 11/11/23 0304  MG 1.8 1.8 1.7 1.8 1.6*  -Replete with IV Mag Sulfate 2 grams -Continue to Monitor and Replete as Necessary -Repeat Mag in the AM    Hypercalcemia -Resolved with IV fluids.  -Calcium Level Trend: Recent Labs  Lab 11/05/23 1831 11/06/23 0411 11/07/23 0345 11/08/23 0259 11/10/23 0312 11/11/23 0304  CALCIUM 10.4* 9.5 9.1 9.5 9.4 8.9  -Continue to Monitor and Trend and repeat CMP in the AM   GERD/GI Prophylaxis -Continue with PPI with Pantoprazole 40 mg po daily  Hypoalbuminemia -Patient's Albumin  Trend: Recent Labs  Lab 11/05/23 2135 11/07/23 0345 11/10/23 0312  ALBUMIN 3.7 3.3* 2.8*  -Continue to Monitor and Trend and repeat CMP in the AM   DVT prophylaxis: Anticoagulated on a Heparin gtt    Code Status: Full Code Family Communication: No family present at bedside   Disposition Plan:  Level of care: Progressive Status is: Inpatient Remains inpatient appropriate because: His further clinical improvement and clearance by the vascular surgery team   Consultants:  Vascular surgery  Procedures:  As delineated as above  Antimicrobials:  Anti-infectives (From admission, onward)    Start     Dose/Rate Route Frequency Ordered Stop   11/10/23 0600  ceFAZolin (ANCEF) IVPB 1 g/50 mL premix       Note to Pharmacy: Send with pt to OR   1 g 100 mL/hr over 30 Minutes Intravenous On call 11/09/23 0802 11/10/23 1026   11/06/23 0600  penicillin g benzathine (BICILLIN LA) 1200000 UNIT/2ML injection 2.4 Million Units  Status:  Discontinued        2.4 Million Units Intramuscular  Once 11/06/23 0551 11/06/23 0551   11/06/23 0600  cefTRIAXone (ROCEPHIN) injection 500 mg  Status:  Discontinued        500 mg Intramuscular  Once 11/06/23 0551 11/06/23 0551       Subjective: Seen and examined at bedside and was complaining of pain in his leg.  No nausea or vomiting.  Feels okay but also felt constipated.  No other concerns or complaints at this time.  Objective: Vitals:   11/11/23 0905 11/11/23 1134 11/11/23 1302 11/11/23 1703  BP: 126/68 99/66 108/61 (!) 116/48  Pulse:  93  90  Resp:  (!) 24  20  Temp:  98 F (36.7 C)  98.7 F (37.1 C)  TempSrc:  Oral  Oral  SpO2:  100%  100%  Weight:      Height:        Intake/Output Summary (Last 24 hours) at 11/11/2023 1936 Last data filed at 11/11/2023 1610 Gross per 24 hour  Intake 1820.6 ml  Output 600 ml  Net 1220.6 ml   Filed Weights   11/05/23 1731 11/06/23 0915  Weight: 73.4 kg 72.6 kg   Examination: Physical  Exam:  Constitutional: Thin chronically ill-appearing elderly African-American male who is complaining of some leg pain Respiratory: Diminished to auscultation bilaterally, no wheezing, rales, rhonchi or crackles. Normal respiratory effort and patient is not tachypenic. No accessory muscle use.  Unlabored breathing Cardiovascular: RRR, no murmurs / rubs / gallops. S1 and S2 auscultated. No extremity edema.   Abdomen: Soft, non-tender, non-distended. Bowel sounds positive.  GU: Deferred. Musculoskeletal: No clubbing / cyanosis of digits/nails. No joint deformity upper and lower extremities.  Skin: No rashes, lesions, ulcers on limited skin evaluation. No induration; Warm and dry.  Neurologic: CN 2-12 grossly intact with no focal deficits.  Romberg sign and cerebellar reflexes not assessed.  Psychiatric: Normal judgment and insight. Alert and oriented x 3. Normal mood and appropriate affect.   Data Reviewed: I have personally reviewed following labs and imaging studies  CBC: Recent Labs  Lab 11/05/23 1748 11/06/23 0411 11/07/23 0345 11/08/23 0259 11/09/23 0334 11/10/23 0312 11/11/23 0304  WBC 10.2   < > 9.4 8.6 8.6 8.1 7.8  NEUTROABS 8.0*  --   --   --   --   --   --   HGB 13.3   < > 8.9* 9.1* 8.2* 8.5* 7.6*  HCT 39.5   < > 26.0* 26.9* 24.5* 25.4* 22.6*  MCV 87.6   < > 87.0 87.1 88.8 88.5 89.0  PLT 244   < > 182 168 160 195 203   < > = values in this interval not displayed.   Basic Metabolic Panel: Recent Labs  Lab 11/05/23 1834 11/06/23 0411 11/06/23 1207 11/07/23 0345 11/08/23 0259 11/10/23 0312 11/11/23 0304  NA  --  135 140 134* 137 134* 136  K  --  4.0 4.0 4.1 3.6 3.9 3.7  CL  --  108  --  105 103 101 104  CO2  --  19*  --  19* 21* 22 22  GLUCOSE  --  134*  --  156* 151* 146* 124*  BUN  --  35*  --  29* 23 38* 29*  CREATININE  --  1.59*  --  1.50* 1.48* 1.81* 1.49*  CALCIUM  --  9.5  --  9.1 9.5 9.4 8.9  MG 1.8 1.8  --  1.7  --  1.8 1.6*  PHOS  --  3.1  --  2.7   --  4.0 2.7   GFR: Estimated Creatinine Clearance: 39.5 mL/min (A) (by C-G formula based on SCr of 1.49 mg/dL (H)). Liver Function Tests: Recent Labs  Lab 11/05/23 2135 11/07/23 0345 11/10/23 0312  AST 114* 183* 232*  ALT 19 21 27   ALKPHOS 60 48 45  BILITOT 0.6 0.8 1.0  PROT 8.3* 7.0 7.1  ALBUMIN 3.7 3.3* 2.8*   No results for input(s): "LIPASE", "AMYLASE" in the last 168 hours. No results for input(s): "AMMONIA" in the last 168 hours. Coagulation Profile: Recent Labs  Lab 11/05/23 2202 11/06/23 0411  INR 1.1 1.1   Cardiac Enzymes: Recent Labs  Lab 11/05/23 2355 11/07/23 0345 11/08/23 0259 11/11/23 0304  CKTOTAL 5,733* 12,526* 9,430* 3,896*   BNP (last 3 results) No results for input(s): "PROBNP" in the last 8760 hours. HbA1C: No results for input(s): "HGBA1C" in the last 72 hours. CBG: Recent Labs  Lab 11/10/23 1651 11/10/23 2133 11/11/23 0556 11/11/23 1144 11/11/23 1710  GLUCAP 131* 159* 154* 134* 131*   Lipid Profile: No results for input(s): "CHOL", "HDL", "LDLCALC", "TRIG", "CHOLHDL", "LDLDIRECT" in the last 72 hours. Thyroid Function Tests: No results for input(s): "TSH", "T4TOTAL", "FREET4", "T3FREE", "THYROIDAB" in the last 72 hours. Anemia Panel: No results for input(s): "VITAMINB12", "FOLATE", "FERRITIN", "TIBC", "IRON", "RETICCTPCT" in the last 72 hours. Sepsis Labs: Recent Labs  Lab 11/05/23 1952 11/05/23 2135 11/08/23 0259  LATICACIDVEN 1.9 2.1* 1.1    Recent Results (from the past 240 hours)  Surgical pcr screen     Status: Abnormal   Collection Time: 11/06/23  8:44 AM   Specimen: Nasal Mucosa; Nasal Swab  Result Value Ref Range Status   MRSA, PCR NEGATIVE NEGATIVE Final   Staphylococcus aureus POSITIVE (A) NEGATIVE Final    Comment: (NOTE) The Xpert SA Assay (FDA approved for NASAL specimens in patients 28 years of age and older), is one component of a comprehensive surveillance program. It is not intended to diagnose infection  nor to guide or monitor treatment. Performed at [facility], [address] [place], [place] [address]     Radiology Studies: No results found.  Scheduled Meds:  insulin aspart  0-6 Units Subcutaneous TID WC   latanoprost  1 drop Both Eyes QHS   metoprolol tartrate  25 mg Oral BID   mupirocin ointment  1 Application Nasal BID   pantoprazole  40 mg Oral Daily   polyethylene glycol  17 g Oral BID   senna-docusate  1 tablet Oral BID   sodium chloride flush  3 mL Intravenous Q12H   Continuous Infusions:  sodium chloride 100 mL/hr at 11/11/23 1734   heparin 1,300 Units/hr (11/11/23 0904)    LOS: 6 days   Marguerita Merles, DO [facility] Available via Epic secure chat 7am-7pm After these hours, please refer to coverage provider listed on [email or web address] 11/11/2023, 7:36 PM

## 2023-11-11 NOTE — Progress Notes (Addendum)
PHARMACY - ANTICOAGULATION CONSULT NOTE  Pharmacy Consult for heparin Indication:  arterial occlusion  Labs: Recent Labs    11/09/23 0334 11/10/23 0312 11/11/23 0304  HGB 8.2* 8.5* 7.6*  HCT 24.5* 25.4* 22.6*  PLT 160 195 203  HEPARINUNFRC 0.37 0.37 0.25*  CREATININE  --  1.81*  --    Assessment: 78yo male subtherapeutic on heparin with lower level despite increased rate; no infusion issues or signs of bleeding per RN.  Goal of Therapy:  Heparin level 0.3-0.7 units/ml   Plan:  Increase heparin infusion by 2 units/kg/hr to 1300 units/hr. Daily heparin level, CBC   Thank you Okey Regal, PharmD

## 2023-11-12 DIAGNOSIS — I70209 Unspecified atherosclerosis of native arteries of extremities, unspecified extremity: Secondary | ICD-10-CM | POA: Diagnosis not present

## 2023-11-12 DIAGNOSIS — E8809 Other disorders of plasma-protein metabolism, not elsewhere classified: Secondary | ICD-10-CM

## 2023-11-12 DIAGNOSIS — D62 Acute posthemorrhagic anemia: Secondary | ICD-10-CM

## 2023-11-12 LAB — CBC WITH DIFFERENTIAL/PLATELET
Abs Immature Granulocytes: 0.05 10*3/uL (ref 0.00–0.07)
Basophils Absolute: 0 10*3/uL (ref 0.0–0.1)
Basophils Relative: 0 %
Eosinophils Absolute: 0.2 10*3/uL (ref 0.0–0.5)
Eosinophils Relative: 3 %
HCT: 19.6 % — ABNORMAL LOW (ref 39.0–52.0)
Hemoglobin: 6.3 g/dL — CL (ref 13.0–17.0)
Immature Granulocytes: 1 %
Lymphocytes Relative: 18 %
Lymphs Abs: 1.3 10*3/uL (ref 0.7–4.0)
MCH: 30.1 pg (ref 26.0–34.0)
MCHC: 32.1 g/dL (ref 30.0–36.0)
MCV: 93.8 fL (ref 80.0–100.0)
Monocytes Absolute: 0.8 10*3/uL (ref 0.1–1.0)
Monocytes Relative: 11 %
Neutro Abs: 4.8 10*3/uL (ref 1.7–7.7)
Neutrophils Relative %: 67 %
Platelets: 199 10*3/uL (ref 150–400)
RBC: 2.09 MIL/uL — ABNORMAL LOW (ref 4.22–5.81)
RDW: 14.1 % (ref 11.5–15.5)
WBC: 7.2 10*3/uL (ref 4.0–10.5)
nRBC: 0 % (ref 0.0–0.2)

## 2023-11-12 LAB — COMPREHENSIVE METABOLIC PANEL
ALT: 19 U/L (ref 0–44)
AST: 100 U/L — ABNORMAL HIGH (ref 15–41)
Albumin: 2.7 g/dL — ABNORMAL LOW (ref 3.5–5.0)
Alkaline Phosphatase: 44 U/L (ref 38–126)
Anion gap: 10 (ref 5–15)
BUN: 14 mg/dL (ref 8–23)
CO2: 18 mmol/L — ABNORMAL LOW (ref 22–32)
Calcium: 8.2 mg/dL — ABNORMAL LOW (ref 8.9–10.3)
Chloride: 103 mmol/L (ref 98–111)
Creatinine, Ser: 1.19 mg/dL (ref 0.61–1.24)
GFR, Estimated: >60 mL/min (ref >60)
Glucose, Bld: 129 mg/dL — ABNORMAL HIGH (ref 70–99)
Potassium: 3.7 mmol/L (ref 3.5–5.1)
Sodium: 131 mmol/L — ABNORMAL LOW (ref 135–145)
Total Bilirubin: 0.9 mg/dL (ref 0.0–1.2)
Total Protein: 6.2 g/dL — ABNORMAL LOW (ref 6.5–8.1)

## 2023-11-12 LAB — FOLATE: Folate: 12.5 ng/mL (ref >5.9)

## 2023-11-12 LAB — VITAMIN B12: Vitamin B-12: 707 pg/mL (ref 180–914)

## 2023-11-12 LAB — GLUCOSE, CAPILLARY
Glucose-Capillary: 122 mg/dL — ABNORMAL HIGH (ref 70–99)
Glucose-Capillary: 118 mg/dL — ABNORMAL HIGH (ref 70–99)
Glucose-Capillary: 140 mg/dL — ABNORMAL HIGH (ref 70–99)
Glucose-Capillary: 119 mg/dL — ABNORMAL HIGH (ref 70–99)

## 2023-11-12 LAB — HEPARIN LEVEL (UNFRACTIONATED): Heparin Unfractionated: 0.37 [IU]/mL (ref 0.30–0.70)

## 2023-11-12 LAB — IRON AND TIBC
Iron: 44 ug/dL — ABNORMAL LOW (ref 45–182)
Saturation Ratios: 20 % (ref 17.9–39.5)
TIBC: 225 ug/dL — ABNORMAL LOW (ref 250–450)
UIBC: 181 ug/dL

## 2023-11-12 LAB — RETICULOCYTES
Immature Retic Fract: 17.7 % — ABNORMAL HIGH (ref 2.3–15.9)
RBC.: 1.96 MIL/uL — ABNORMAL LOW (ref 4.22–5.81)
Retic Count, Absolute: 75.5 10*3/uL (ref 19.0–186.0)
Retic Ct Pct: 3.9 % — ABNORMAL HIGH (ref 0.4–3.1)

## 2023-11-12 LAB — CK: Total CK: 1646 U/L — ABNORMAL HIGH (ref 49–397)

## 2023-11-12 LAB — MAGNESIUM: Magnesium: 1.8 mg/dL (ref 1.7–2.4)

## 2023-11-12 LAB — PHOSPHORUS: Phosphorus: 2.3 mg/dL — ABNORMAL LOW (ref 2.5–4.6)

## 2023-11-12 LAB — FERRITIN: Ferritin: 251 ng/mL (ref 24–336)

## 2023-11-12 LAB — PREPARE RBC (CROSSMATCH)

## 2023-11-12 MED ORDER — SODIUM CHLORIDE 0.9% IV SOLUTION: Freq: Once | INTRAVENOUS | Status: DC

## 2023-11-12 MED ORDER — SODIUM PHOSPHATES 45 MMOLE/15ML IV SOLN
15.0000 mmol | Freq: Once | INTRAVENOUS | Status: AC
  Administered 2023-11-12: 15 mmol via INTRAVENOUS
  Filled 2023-11-12: qty 5

## 2023-11-12 MED ORDER — MAGNESIUM SULFATE 2 GM/50ML IV SOLN
2.0000 g | Freq: Once | INTRAVENOUS | Status: AC
  Administered 2023-11-12: 2 g via INTRAVENOUS
  Filled 2023-11-12: qty 50

## 2023-11-12 MED ORDER — HYDROMORPHONE HCL 1 MG/ML IJ SOLN
1.0000 mg | Freq: Once | INTRAMUSCULAR | Status: AC
  Administered 2023-11-12: 1 mg via INTRAVENOUS
  Filled 2023-11-12: qty 1

## 2023-11-12 NOTE — Progress Notes (Signed)
Physical Therapy Treatment Patient Details Name: Manuel Winters MRN: [id number] DOB: 08/04/1946 Today's Date: 11/12/2023   History of Present Illness Pt is a 78 y/o male presenting 11/05/23 with persistent right lower extremity pain in setting of limb ischemia. Underwent R common femoral endarterectomy, thrombectomy via femoral approach, popliteal cutdown w/ tibial thrombectomy and 4 compartment fasciotomies on 1/17. PMH: HTN, DM2, HLD, CKD    PT Comments  Patient progressing into hallway with ambulation with encouragement.  Pre-medicated prior to ambulation though still limits pt getting foot flat and walking further.  Patient able to complete seated therex for R LE strength and ROM.  Family present and supportive.  Feel he should continue to progress to be able to return home with family support and HHPT.    If plan is discharge home, recommend the following: A little help with walking and/or transfers;A little help with bathing/dressing/bathroom;Assistance with cooking/housework;Help with stairs or ramp for entrance;Assist for transportation   Can travel by private vehicle        Equipment Recommendations  Rolling walker (2 wheels);BSC/3in1 (possibly transport wheelchair)    Recommendations for Other Services       Precautions / Restrictions Precautions Precautions: Fall Precaution Comments: watch HR     Mobility  Bed Mobility Overal bed mobility: Needs Assistance Bed Mobility: Supine to Sit, Sit to Supine     Supine to sit: HOB elevated, Supervision Sit to supine: Supervision   General bed mobility comments: patient managing his leg, attempting to manage lines as well, though cues for keeping to managing his leg for safety    Transfers Overall transfer level: Needs assistance Equipment used: Rolling walker (2 wheels) Transfers: Sit to/from Stand Sit to Stand: Contact guard assist           General transfer comment: increased time, did figure out technique for hand  placement without cues    Ambulation/Gait Ambulation/Gait assistance: Contact guard assist Gait Distance (Feet): 45 Feet Assistive device: Rolling walker (2 wheels) Gait Pattern/deviations: Step-to pattern, Trunk flexed, Antalgic       General Gait Details: cues for at least touch down with R LE and pt able to perform about 70% of the time, hopping some and noting needing A for safety with turns especially with RW caught in [facility] when turning at bedside to sit needing help for freeing walker for safety   Stairs             Wheelchair Mobility     Tilt Bed    Modified Rankin (Stroke Patients Only)       Balance Overall balance assessment: Needs assistance   Sitting balance-Leahy Scale: Good     Standing balance support: Bilateral upper extremity supported Standing balance-Leahy Scale: Poor                              Cognition Arousal: Alert Behavior During Therapy: Anxious Overall Cognitive Status: Within Functional Limits for tasks assessed                                          Exercises General Exercises - Lower Extremity Ankle Circles/Pumps: AROM, 10 reps, Right, Seated Long Arc Quad: AROM, Right, Seated, 10 reps, 15 reps    General Comments General comments (skin integrity, edema, etc.): HR 110's with mobility, noted no drainage from incisions after ambulation;  family in the room and supportive      Pertinent Vitals/Pain Pain Assessment Faces Pain Scale: Hurts whole lot Pain Location: R leg with dependency Pain Descriptors / Indicators: Discomfort, Grimacing, Guarding Pain Intervention(s): Monitored during session, Repositioned, Premedicated before session    Home Living                          Prior Function            PT Goals (current goals can now be found in the care plan section) Progress towards PT goals: Progressing toward goals    Frequency    Min 1X/week      PT Plan       Co-evaluation              AM-PAC PT "6 Clicks" Mobility   Outcome Measure  Help needed turning from your back to your side while in a flat bed without using bedrails?: None Help needed moving from lying on your back to sitting on the side of a flat bed without using bedrails?: None Help needed moving to and from a bed to a chair (including a wheelchair)?: A Little Help needed standing up from a chair using your arms (e.g., wheelchair or bedside chair)?: A Little Help needed to walk in hospital room?: A Little Help needed climbing 3-5 steps with a railing? : Total 6 Click Score: 18    End of Session Equipment Utilized During Treatment: Gait belt Activity Tolerance: Patient limited by pain Patient left: in bed;with call bell/phone within reach;with family/visitor present   PT Visit Diagnosis: Unsteadiness on feet (R26.81);Other abnormalities of gait and mobility (R26.89);Muscle weakness (generalized) (M62.81);Difficulty in walking, not elsewhere classified (R26.2);Pain Pain - Right/Left: Right Pain - part of body: Leg     Time: 6644-0347 PT Time Calculation (min) (ACUTE ONLY): 21 min  Charges:    $Gait Training: 8-22 mins PT General Charges $$ ACUTE PT VISIT: 1 Visit                     Manuel Winters, PT Acute Rehabilitation Services Office:[phone] 11/12/2023    Manuel Winters 11/12/2023, 4:04 PM

## 2023-11-12 NOTE — Progress Notes (Signed)
Mobility Specialist Progress Note:    11/12/23 1000  Mobility  Activity Transferred to/from BSC (to [facility])  Level of Assistance Minimal assist, patient does 75% or more  Assistive Device Front wheel walker  Distance Ambulated (ft) 4 ft  Activity Response Tolerated well  Mobility Referral Yes  Mobility visit 1 Mobility  Mobility Specialist Start Time (ACUTE ONLY) 0935  Mobility Specialist Stop Time (ACUTE ONLY) 0950  Mobility Specialist Time Calculation (min) (ACUTE ONLY) 15 min   Pt received in bed requesting to use bed pan. Encouraged to get to [facility], patient agreeable. Was able to hop to [facility] w/ MinA d/t posterior lean and unsteadiness. Pt RLE was NWB throughout session. Call bell and personal belongings in reach. All needs met.  Thompson Grayer Mobility Specialist  Please contact vis Secure Chat or  Rehab Office [phone]

## 2023-11-12 NOTE — Progress Notes (Signed)
PHARMACY - ANTICOAGULATION CONSULT NOTE  Pharmacy Consult for heparin Indication:  arterial occlusion  Labs: Recent Labs    11/10/23 0312 11/11/23 0304 11/12/23 0312  HGB 8.5* 7.6* 6.3*  HCT 25.4* 22.6* 19.6*  PLT 195 203 199  HEPARINUNFRC 0.37 0.25* 0.37  CREATININE 1.81* 1.49* 1.19  CKTOTAL  --  3,896*  --    Assessment: 78yo male therapeutic on heparin for arterial occlusion Receiving 1 unit of blood today  Goal of Therapy:  Heparin level 0.3-0.7 units/ml   Plan:  Continue heparin at 1300 units / hr Daily heparin level, CBC   Thank you Okey Regal, PharmD

## 2023-11-12 NOTE — Progress Notes (Signed)
PROGRESS NOTE    Manuel Winters  ZOX:[id number] DOB: 09-09-1946 DOA: 11/05/2023 PCP: Oletha Blend, MD   Brief Narrative:  This 78 yrs old Male with PMH significant for hypertension, diabetes type 2, hyperlipidemia, CKD 3, recent ED and outpatient visits for right leg pain, who presents due to persistent symptoms. Initially reports that his pain has been ongoing for about 1 month. He was chasing a truck and tripped and fell, reports about a week later developed pain from below the knee down to the foot which is intermittent and severe. He denies any numbness or weakness in the foot but does have deficits on exam. Right leg has been cool. Pain has been progressive especially in the past week, has been trying to ambulate with the use of crutches but in the past few days not able to get up much at all. Had no known history of PAD or other ASCVD prior, not taking antiplatelets or anticoagulants prior to this admission. No history of bleeding issues. He was admitted for further evaluation.   **Interim History Subsequently he underwent a right CFA endarterectomy with bovine patch angioplasty, right lower extremity thrombectomy abdominal popliteal and tibial thrombectomy of the AT, PT and peroneal arteries and 4 compartment fasciotomy with subsequent medial fasciotomy and closure performed on 11/09/2022.  *11/12/23: Blood Count dropped overnight so he will be typed and screend and transfused 1 unit of pRBC's. Having quite a bit of pain so will need to continue Analgesics. PT/OT Recommending Home Health.   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 PAD with bilateral SFA occlusions: -Patient has chronic PAD now presented with worsening right leg symptoms. -Etiology of this possibly atherothrombotic /embolic from underlying PAD v less likely cardioembolic with newly identified Aflutter.  RLE below the knee is cold with no pedal pulses  on the Right, there is numbness in stocking distribution below the ankle on the R side, and strength is 3/5 with dorsiflexion at the ankle, no tissue loss.  -CTA with complete occlusion at R popliteal with no runoff to the foot, additional occlusions R SFA origin + prox SFA with reconstitution, and L CFA with reconstitution and 3 vessel runoff on the left.  -Vascular surgery Dr. Lenell Antu recommends for transfer to [facility] and initiation of heparin drip.  -Serial neurovascular checks of the right lower extremity -Adequate pain control. -Patient underwent Right CFA endarterectomy with bovine patch angiopasty, RLE thrombectomy, cutdown popliteal with tibial thrombectomy of the AT, PT, peroneal arteries and 4 compartment fasciotomies 11/06/2023 by Dr. Chestine Spore  -Continue Adequate Pain control.  -Subsequently patient underwent medial fasciotomy and closure performed 11/09/2022. -Pain control po Acetaminophen 1000 mg po q6hprn Mild Pain, IV Hydromorphone 0.5 mg q4hprn Breakthrough Pain; C/w Oxycodone IR 5 mg q4hprn Moderate Pain or 10 mg po q4hprn Severe Pain  -PT recommending Home Health and OT recommending SNF -Vascular evaluated and he continues to have brisk Right peroneal doppler signal -Continue to Monitor    Lactic Acidosis:  -Continue with IV fluids.  -Lactic acid normalized. Recent Labs  Lab 11/05/23 1952 11/05/23 2135 11/08/23 0259  LATICACIDVEN 1.9 2.1* 1.1    Rhabdomyolysis:  -Elevated CKD levels. Continue with IV fluids but reduce NS at 100 mL/hr to 75 mL/hr -CK Level trend went from 5733 -> 12,526 -> 9430 -> 3896 -> 1646 -CK level continues to remain elevated , trend CK level.   Acute myocardial injury: -No history of chest pain.  High-sensitivity troponin  22 -> 26.   -EKG with a flutter with no acute ischemic changes. -Likely demand event in the setting of arrhythmia, management directed at underlying A-fib above.   Constipation -Initiated Bowel regimen 17 grams po BID,  Senna-Docusate 1 tab po BID, and Bisacodyl 10 mg RC Daily PRN Moderate Constipation   Persistent Atrial Flutter -Patient presented with atrial flutter, started on IV diltiazem drip. -Echo showed LVEF 55 to 60%, TSH normal -Heart rate now controlled, Continue Lopressor 25 mg twice daily. -Cardizem discontinued. -CHADS2 score 5. Patient on IV heparin with plan to transition to DOAC before discharge when okay with vascular surgery. -If patient continued to remain in A-fib,  Consider outpatient DCCV after 3 weeks of uninterrupted AC. -Cardiology has signed off the case now but if necessary will reconsult   AKI on CKD stage IIIa Metabolic Acidosis -Improving with IV fluids and will reduce IVF to 75 mL/hr -Continue Holding chlorthalidone and losartan.  -BUN/Cr Trend: Recent Labs  Lab 11/05/23 1831 11/06/23 0411 11/07/23 0345 11/08/23 0259 11/10/23 0312 11/11/23 0304  BUN 43* 35* 29* 23 38* 29*  CREATININE 2.19* 1.59* 1.50* 1.48* 1.81* 1.49*  -Has a Slight Metabolic Acidosis with a CO2 of 18, AG of 10, Chloride Level of 103 -Avoid Nephrotoxic Medications, Contrast Dyes, Hypotension and Dehydration to Ensure Adequate Renal Perfusion and will need to Renally Adjust Meds -Continue to Monitor and Trend Renal Function carefully and repeat CMP in the AM   ABLA/Dilutional Drop -Hgb/Hct Trend: Recent Labs  Lab 11/06/23 1207 11/07/23 0345 11/08/23 0259 11/09/23 0334 11/10/23 0312 11/11/23 0304 11/12/23 0312  HGB 8.8* 8.9* 9.1* 8.2* 8.5* 7.6* 6.3*  HCT 26.0* 26.0* 26.9* 24.5* 25.4* 22.6* 19.6*  MCV  --  87.0 87.1 88.8 88.5 89.0 93.8  -Checked Anemia Panel and showed and Iron Level of 44, UBIC of 181, TIBC of 225, Saturation Ratio of 20, Ferritin Level of 251, Folate Level of 12.5, and 707 -Typed and Screend and Transfused 1 unit pRBCs given that Hgb dropped to 6.3 -Continue to Monitor for S/Sx of Bleeding; No overt bleeding ntoed  Essential Hypertension: -BP parameters have improved.   -Continue Metoprolol Tartrate 25 mg twice daily. -Continue to Monitor BP per Protocol -Last BP reading was 108/54   DM Type 2  -Resume SSI with very sensitive NovoLog sliding scale AC -HbA1c was 7.2 -Hold PO DM meds. -Continue monitor CBG trend: Recent Labs  Lab 11/10/23 1102 11/10/23 1203 11/10/23 1651 11/10/23 2133 11/11/23 0556 11/11/23 1144 11/11/23 1710  GLUCAP 128* 119* 131* 159* 154* 134* 131*   Hypomagnesemia -Patient's Mag Level Trend: Recent Labs  Lab 11/05/23 1834 11/06/23 0411 11/07/23 0345 11/10/23 0312 11/11/23 0304  MG 1.8 1.8 1.7 1.8 1.6*  -Replete with IV Mag Sulfate 2 grams -Continue to Monitor and Replete as Necessary -Repeat Mag in the AM    Hypercalcemia -Resolved with IV fluids.  -Calcium Level Trend: Recent Labs  Lab 11/05/23 1831 11/06/23 0411 11/07/23 0345 11/08/23 0259 11/10/23 0312 11/11/23 0304  CALCIUM 10.4* 9.5 9.1 9.5 9.4 8.9  -Continue to Monitor and Trend and repeat CMP in the AM   GERD/GI Prophylaxis -Continue with PPI with Pantoprazole 40 mg po daily  Hypoalbuminemia -Patient's Albumin Trend: Recent Labs  Lab 11/05/23 2135 11/07/23 0345 11/10/23 0312  ALBUMIN 3.7 3.3* 2.8*  -Continue to Monitor and Trend and repeat CMP in the AM   DVT prophylaxis: Continue to be on Heparin gtt    Code Status: Full Code Family Communication: No family  present at bedside  Disposition Plan:  Level of care: Progressive Status is: Inpatient Remains inpatient appropriate because: Needs further clinical improvement and stabilization and clearance by Vascular Surgery   Consultants:  Vascular Surgery  Procedures:  As delineated as above  Antimicrobials:  Anti-infectives (From admission, onward)    Start     Dose/Rate Route Frequency Ordered Stop   11/10/23 0600  ceFAZolin (ANCEF) IVPB 1 g/50 mL premix       Note to Pharmacy: Send with pt to OR   1 g 100 mL/hr over 30 Minutes Intravenous On call 11/09/23 0802 11/10/23 1026    11/06/23 0600  penicillin g benzathine (BICILLIN LA) 1200000 UNIT/2ML injection 2.4 Million Units  Status:  Discontinued        2.4 Million Units Intramuscular  Once 11/06/23 0551 11/06/23 0551   11/06/23 0600  cefTRIAXone (ROCEPHIN) injection 500 mg  Status:  Discontinued        500 mg Intramuscular  Once 11/06/23 0551 11/06/23 0551       Subjective: Seen and examined at bedside and he appeared very uncomfortable complaining of significant pain in his leg.  Started crying because of the pain was in bed.  No nausea or vomiting.  Has not had a bowel movement yet.  Denies any lightheadedness or dizziness.  No other concerns or complaints this time but blood count dropped so he will be getting a unit of PRBCs.  Objective: Vitals:   11/12/23 1211 11/12/23 1630 11/12/23 1633 11/12/23 1653  BP: 98/62 (!) 118/57 (!) 118/57 (!) 108/54  Pulse: 69 (!) 2    Resp: 20  (!) 22 16  Temp: 99.2 F (37.3 C) 98.8 F (37.1 C)  98.5 F (36.9 C)  TempSrc: Oral Oral  Oral  SpO2: 100%   98%  Weight:      Height:        Intake/Output Summary (Last 24 hours) at 11/12/2023 2001 Last data filed at 11/12/2023 1700 Gross per 24 hour  Intake 1494 ml  Output 1400 ml  Net 94 ml   Filed Weights   11/05/23 1731 11/06/23 0915  Weight: 73.4 kg 72.6 kg   Examination: Physical Exam:  Constitutional: Thin chronically ill-appearing elderly African-American male who appears extremely uncomfortable and in pain Respiratory: Diminished to auscultation bilaterally, no wheezing, rales, rhonchi or crackles. Normal respiratory effort and patient is not tachypenic. No accessory muscle use.  Unlabored breathing Cardiovascular: RRR, no murmurs / rubs / gallops. S1 and S2 auscultated some mild extremity edema Abdomen: Soft, non-tender, non-distended. Bowel sounds positive.  GU: Deferred. Musculoskeletal: No clubbing / cyanosis of digits/nails. No joint deformity upper and lower extremities.  Skin: Leg incisions and  staples noted Neurologic: CN 2-12 grossly intact with no focal deficits.  Romberg sign cerebellar reflexes not assessed.  Psychiatric: Awake and alert but extremely anxious and tearful  Data Reviewed: I have personally reviewed following labs and imaging studies  CBC: Recent Labs  Lab 11/08/23 0259 11/09/23 0334 11/10/23 0312 11/11/23 0304 11/12/23 0312  WBC 8.6 8.6 8.1 7.8 7.2  NEUTROABS  --   --   --   --  4.8  HGB 9.1* 8.2* 8.5* 7.6* 6.3*  HCT 26.9* 24.5* 25.4* 22.6* 19.6*  MCV 87.1 88.8 88.5 89.0 93.8  PLT 168 160 195 203 199   Basic Metabolic Panel: Recent Labs  Lab 11/06/23 0411 11/06/23 1207 11/07/23 0345 11/08/23 0259 11/10/23 0312 11/11/23 0304 11/12/23 0312  NA 135   < > 134* 137  134* 136 131*  K 4.0   < > 4.1 3.6 3.9 3.7 3.7  CL 108  --  105 103 101 104 103  CO2 19*  --  19* 21* 22 22 18*  GLUCOSE 134*  --  156* 151* 146* 124* 129*  BUN 35*  --  29* 23 38* 29* 14  CREATININE 1.59*  --  1.50* 1.48* 1.81* 1.49* 1.19  CALCIUM 9.5  --  9.1 9.5 9.4 8.9 8.2*  MG 1.8  --  1.7  --  1.8 1.6* 1.8  PHOS 3.1  --  2.7  --  4.0 2.7 2.3*   < > = values in this interval not displayed.   GFR: Estimated Creatinine Clearance: 49.5 mL/min (by C-G formula based on SCr of 1.19 mg/dL). Liver Function Tests: Recent Labs  Lab 11/05/23 2135 11/07/23 0345 11/10/23 0312 11/12/23 0312  AST 114* 183* 232* 100*  ALT 19 21 27 19   ALKPHOS 60 48 45 44  BILITOT 0.6 0.8 1.0 0.9  PROT 8.3* 7.0 7.1 6.2*  ALBUMIN 3.7 3.3* 2.8* 2.7*   No results for input(s): "LIPASE", "AMYLASE" in the last 168 hours. No results for input(s): "AMMONIA" in the last 168 hours. Coagulation Profile: Recent Labs  Lab 11/05/23 2202 11/06/23 0411  INR 1.1 1.1   Cardiac Enzymes: Recent Labs  Lab 11/05/23 2355 11/07/23 0345 11/08/23 0259 11/11/23 0304 11/12/23 0928  CKTOTAL 5,733* 12,526* 9,430* 3,896* 1,646*   BNP (last 3 results) No results for input(s): "PROBNP" in the last 8760  hours. HbA1C: No results for input(s): "HGBA1C" in the last 72 hours. CBG: Recent Labs  Lab 11/11/23 1710 11/11/23 2120 11/12/23 0629 11/12/23 1208 11/12/23 1646  GLUCAP 131* 135* 122* 118* 140*   Lipid Profile: No results for input(s): "CHOL", "HDL", "LDLCALC", "TRIG", "CHOLHDL", "LDLDIRECT" in the last 72 hours. Thyroid Function Tests: No results for input(s): "TSH", "T4TOTAL", "FREET4", "T3FREE", "THYROIDAB" in the last 72 hours. Anemia Panel: Recent Labs    11/12/23 0312  VITAMINB12 707  FOLATE 12.5  FERRITIN 251  TIBC 225*  IRON 44*  RETICCTPCT 3.9*   Sepsis Labs: Recent Labs  Lab 11/05/23 2135 11/08/23 0259  LATICACIDVEN 2.1* 1.1   Recent Results (from the past 240 hours)  Surgical pcr screen     Status: Abnormal   Collection Time: 11/06/23  8:44 AM   Specimen: Nasal Mucosa; Nasal Swab  Result Value Ref Range Status   MRSA, PCR NEGATIVE NEGATIVE Final   Staphylococcus aureus POSITIVE (A) NEGATIVE Final    Comment: (NOTE) The Xpert SA Assay (FDA approved for NASAL specimens in patients 23 years of age and older), is one component of a comprehensive surveillance program. It is not intended to diagnose infection nor to guide or monitor treatment. Performed at [facility], [address] [place], [place] [address]     Radiology Studies: No results found.  Scheduled Meds:  sodium chloride   Intravenous Once   insulin aspart  0-6 Units Subcutaneous TID WC   latanoprost  1 drop Both Eyes QHS   metoprolol tartrate  25 mg Oral BID   pantoprazole  40 mg Oral Daily   polyethylene glycol  17 g Oral BID   senna-docusate  1 tablet Oral BID   sodium chloride flush  3 mL Intravenous Q12H   Continuous Infusions:  sodium chloride 100 mL/hr at 11/12/23 0428   heparin 1,300 Units/hr (11/12/23 0432)    LOS: 7 days   Marguerita Merles, DO  [facility] Available via Epic secure chat 7am-7pm After these hours, please refer to coverage provider listed on  [email or web address] 11/12/2023, 8:01 PM

## 2023-11-12 NOTE — Progress Notes (Signed)
  Progress Note    11/12/2023 11:20 AM 2 Days Post-Op  Subjective:  says he had right foot pain but now easing off   Vitals:   11/12/23 0415 11/12/23 0800  BP: (!) 103/52 (!) 120/59  Pulse: 73 74  Resp: 15 15  Temp: 98.5 F (36.9 C) 99.4 F (37.4 C)  SpO2: 98% 97%    Physical Exam: General:  no distress Lungs:  non labored Incisions:  right groin is soft without hematoma; right leg incisions look good without evidence of bleeding Extremities:  brisk right peroneal doppler signal; right foot is warm   CBC    Component Value Date/Time   WBC 7.2 11/12/2023 0312   RBC 2.09 (L) 11/12/2023 0312   RBC 1.96 (L) 11/12/2023 0312   HGB 6.3 (LL) 11/12/2023 0312   HCT 19.6 (L) 11/12/2023 0312   PLT 199 11/12/2023 0312   MCV 93.8 11/12/2023 0312   MCH 30.1 11/12/2023 0312   MCHC 32.1 11/12/2023 0312   RDW 14.1 11/12/2023 0312   LYMPHSABS 1.3 11/12/2023 0312   MONOABS 0.8 11/12/2023 0312   EOSABS 0.2 11/12/2023 0312   BASOSABS 0.0 11/12/2023 0312    BMET    Component Value Date/Time   NA 131 (L) 11/12/2023 0312   K 3.7 11/12/2023 0312   CL 103 11/12/2023 0312   CO2 18 (L) 11/12/2023 0312   GLUCOSE 129 (H) 11/12/2023 0312   BUN 14 11/12/2023 0312   CREATININE 1.19 11/12/2023 0312   CALCIUM 8.2 (L) 11/12/2023 0312   GFRNONAA >60 11/12/2023 0312    INR    Component Value Date/Time   INR 1.1 11/06/2023 0411     Intake/Output Summary (Last 24 hours) at 11/12/2023 1120 Last data filed at 11/12/2023 0419 Gross per 24 hour  Intake 1254 ml  Output 1000 ml  Net 254 ml      Assessment/Plan:  78 y.o. male is s/p:  Right CFA endarterectomy with bovine patch angiopasty, RLE thrombectomy, cutdown popliteal with tibial thrombectomy of the AT, PT, peroneal arteries and 4 compartment fasciotomies 11/06/2023 by Dr. Chestine Spore and closure of right medial fasciotomy 11/10/2023 by Dr. Myra Gianotti    2 Days Post-Op   -called to see pt for right foot pain.  He continues to have a brisk  right peroneal doppler signal and his pain is easing off.   -right groin and right leg incisions look good without evidence of bleeding.   -has not yet received his transfusion. -DVT prophylaxis: heparin gtt.   Doreatha Massed, PA-C [facility] [phone] 11/12/2023 11:20 AM

## 2023-11-12 NOTE — Progress Notes (Signed)
[facility] night cross cover note:   I was notified by RN of the patient's Hgb level of 6.3 this morning.  It appears that the patient's hemoglobin level has been trending down the last 2 days, with most recent prior hemoglobin level of 7.6 on the morning of 11/11/23, preceded by 8.5 on the morning of 11/10/2023.  No overt bleeding reported at this time, and no report of associated acute symptoms.  Systolic blood pressures overnight have been in the low 100s to 120s mmHg, with heart rates in the 70s to 80s.  Per chart review, he has an active type and screen drawn within the last 72 hours.  I subsequently ordered transfusion of 1 unit PRBC over 3 hours, along with order for repeat H&H to be checked following completion of transfusion of this 1 unit PRBC.    Newton Pigg, DO Hospitalist

## 2023-11-12 NOTE — Plan of Care (Signed)
  Problem: Coping: Goal: Ability to adjust to condition or change in health will improve Outcome: Progressing   Problem: Fluid Volume: Goal: Ability to maintain a balanced intake and output will improve Outcome: Progressing   Problem: Health Behavior/Discharge Planning: Goal: Ability to identify and utilize available resources and services will improve Outcome: Progressing Goal: Ability to manage health-related needs will improve Outcome: Progressing   Problem: Metabolic: Goal: Ability to maintain appropriate glucose levels will improve Outcome: Progressing   Problem: Nutritional: Goal: Maintenance of adequate nutrition will improve Outcome: Progressing Goal: Progress toward achieving an optimal weight will improve Outcome: Progressing   Problem: Skin Integrity: Goal: Risk for impaired skin integrity will decrease Outcome: Progressing   Problem: Tissue Perfusion: Goal: Adequacy of tissue perfusion will improve Outcome: Progressing   Problem: Health Behavior/Discharge Planning: Goal: Ability to manage health-related needs will improve Outcome: Progressing   Problem: Clinical Measurements: Goal: Ability to maintain clinical measurements within normal limits will improve Outcome: Progressing Goal: Will remain free from infection Outcome: Progressing Goal: Diagnostic test results will improve Outcome: Progressing Goal: Respiratory complications will improve Outcome: Progressing Goal: Cardiovascular complication will be avoided Outcome: Progressing   Problem: Activity: Goal: Risk for activity intolerance will decrease Outcome: Progressing   Problem: Nutrition: Goal: Adequate nutrition will be maintained Outcome: Progressing   Problem: Coping: Goal: Level of anxiety will decrease Outcome: Progressing   Problem: Elimination: Goal: Will not experience complications related to bowel motility Outcome: Progressing Goal: Will not experience complications related to  urinary retention Outcome: Progressing   Problem: Pain Managment: Goal: General experience of comfort will improve and/or be controlled Outcome: Progressing   Problem: Safety: Goal: Ability to remain free from injury will improve Outcome: Progressing   Problem: Skin Integrity: Goal: Risk for impaired skin integrity will decrease Outcome: Progressing

## 2023-11-12 NOTE — Progress Notes (Addendum)
Progress Note    11/12/2023 6:46 AM 2 Days Post-Op  Subjective:  sleeping and wakes easily.   Denies any blood in his urine.  He has not had a BM and denies any blood from rectum.  Denies any abdominal pain.   Tm 99.2 now afebrile  Vitals:   11/12/23 0100 11/12/23 0415  BP:  (!) 103/52  Pulse:  73  Resp: 20 15  Temp:  98.5 F (36.9 C)  SpO2:  98%    Physical Exam: General:  no distress Cardiac:  regular Lungs:  non labored Incisions:  right groin incision looks good, soft no evidence of hematoma; right lower leg incisions are clean and dry without drainage or evidence of bleeding Extremities:  + doppler flow right pero and PT; motor unchanged Abdomen:  soft, NT  CBC    Component Value Date/Time   WBC 7.2 11/12/2023 0312   RBC 2.09 (L) 11/12/2023 0312   RBC 1.96 (L) 11/12/2023 0312   HGB 6.3 (LL) 11/12/2023 0312   HCT 19.6 (L) 11/12/2023 0312   PLT 199 11/12/2023 0312   MCV 93.8 11/12/2023 0312   MCH 30.1 11/12/2023 0312   MCHC 32.1 11/12/2023 0312   RDW 14.1 11/12/2023 0312   LYMPHSABS 1.3 11/12/2023 0312   MONOABS 0.8 11/12/2023 0312   EOSABS 0.2 11/12/2023 0312   BASOSABS 0.0 11/12/2023 0312    BMET    Component Value Date/Time   NA 131 (L) 11/12/2023 0312   K 3.7 11/12/2023 0312   CL 103 11/12/2023 0312   CO2 18 (L) 11/12/2023 0312   GLUCOSE 129 (H) 11/12/2023 0312   BUN 14 11/12/2023 0312   CREATININE 1.19 11/12/2023 0312   CALCIUM 8.2 (L) 11/12/2023 0312   GFRNONAA >60 11/12/2023 0312    INR    Component Value Date/Time   INR 1.1 11/06/2023 0411     Intake/Output Summary (Last 24 hours) at 11/12/2023 0646 Last data filed at 11/12/2023 0419 Gross per 24 hour  Intake 1254 ml  Output 1000 ml  Net 254 ml      Assessment/Plan:  78 y.o. male is s/p:  Right CFA endarterectomy with bovine patch angiopasty, RLE thrombectomy, cutdown popliteal with tibial thrombectomy of the AT, PT, peroneal arteries and 4 compartment fasciotomies 11/06/2023  by Dr. Chestine Spore and closure of right medial fasciotomy 11/10/2023 by Dr. Myra Gianotti   2 Days Post-Op   -pt with brisk doppler flow right foot; motor essentially unchanged.  -acute blood loss anemia with hgb drop today to 6.3.  one unit of PRBC has been ordered by [facility].  The right groin is soft without hematoma; right leg incisions are without drainage or evidence of bleeding.   -worked with therapy yesterday-continue to mobilize -DVT prophylaxis:  heparin gtt   Doreatha Massed, PA-C [facility] [phone] 11/12/2023 6:46 AM  I have seen and evaluated the patient. I agree with the PA note as documented above.  77-old male status post right common femoral endarterectomy with bovine patch and right lower extremity thrombectomy including popliteal cutdown with tibial thrombectomy and fasciotomies.  Fasciotomies are now closed.  Right groin looks good.  He has brisk AT peroneal signal in the right foot.  Hemoglobin did drop to 6.3 and this does not appear to be related to his surgical incisions.  He is on heparin now.  Pending his response to transfusion he can be transition to a DOAC  PT OT etc.  Cephus Shelling, MD [facility] [place] Office: [phone]

## 2023-11-13 ENCOUNTER — Inpatient Hospital Stay (HOSPITAL_COMMUNITY): Payer: Medicare PPO

## 2023-11-13 DIAGNOSIS — I70209 Unspecified atherosclerosis of native arteries of extremities, unspecified extremity: Secondary | ICD-10-CM | POA: Diagnosis not present

## 2023-11-13 LAB — GLUCOSE, CAPILLARY
Glucose-Capillary: 142 mg/dL — ABNORMAL HIGH (ref 70–99)
Glucose-Capillary: 101 mg/dL — ABNORMAL HIGH (ref 70–99)
Glucose-Capillary: 120 mg/dL — ABNORMAL HIGH (ref 70–99)
Glucose-Capillary: 114 mg/dL — ABNORMAL HIGH (ref 70–99)

## 2023-11-13 LAB — BPAM RBC
Blood Product Expiration Date: 202502132359
ISSUE DATE / TIME: 202501231617
Unit Type and Rh: 5100

## 2023-11-13 LAB — COMPREHENSIVE METABOLIC PANEL
ALT: 19 U/L (ref 0–44)
AST: 64 U/L — ABNORMAL HIGH (ref 15–41)
Albumin: 2.9 g/dL — ABNORMAL LOW (ref 3.5–5.0)
Alkaline Phosphatase: 50 U/L (ref 38–126)
Anion gap: 7 (ref 5–15)
BUN: 11 mg/dL (ref 8–23)
CO2: 22 mmol/L (ref 22–32)
Calcium: 9.1 mg/dL (ref 8.9–10.3)
Chloride: 112 mmol/L — ABNORMAL HIGH (ref 98–111)
Creatinine, Ser: 1.17 mg/dL (ref 0.61–1.24)
GFR, Estimated: >60 mL/min (ref >60)
Glucose, Bld: 129 mg/dL — ABNORMAL HIGH (ref 70–99)
Potassium: 4.1 mmol/L (ref 3.5–5.1)
Sodium: 141 mmol/L (ref 135–145)
Total Bilirubin: 1.1 mg/dL (ref 0.0–1.2)
Total Protein: 6.7 g/dL (ref 6.5–8.1)

## 2023-11-13 LAB — TYPE AND SCREEN
ABO/RH(D): O POS
Antibody Screen: NEGATIVE
Unit division: 0

## 2023-11-13 LAB — CBC WITH DIFFERENTIAL/PLATELET
Abs Immature Granulocytes: 0.06 10*3/uL (ref 0.00–0.07)
Basophils Absolute: 0 10*3/uL (ref 0.0–0.1)
Basophils Relative: 0 %
Eosinophils Absolute: 0.3 10*3/uL (ref 0.0–0.5)
Eosinophils Relative: 3 %
HCT: 25.3 % — ABNORMAL LOW (ref 39.0–52.0)
Hemoglobin: 8.4 g/dL — ABNORMAL LOW (ref 13.0–17.0)
Immature Granulocytes: 1 %
Lymphocytes Relative: 19 %
Lymphs Abs: 1.7 10*3/uL (ref 0.7–4.0)
MCH: 30.1 pg (ref 26.0–34.0)
MCHC: 33.2 g/dL (ref 30.0–36.0)
MCV: 90.7 fL (ref 80.0–100.0)
Monocytes Absolute: 1.1 10*3/uL — ABNORMAL HIGH (ref 0.1–1.0)
Monocytes Relative: 12 %
Neutro Abs: 5.8 10*3/uL (ref 1.7–7.7)
Neutrophils Relative %: 65 %
Platelets: 237 10*3/uL (ref 150–400)
RBC: 2.79 MIL/uL — ABNORMAL LOW (ref 4.22–5.81)
RDW: 14 % (ref 11.5–15.5)
WBC: 9 10*3/uL (ref 4.0–10.5)
nRBC: 0 % (ref 0.0–0.2)

## 2023-11-13 LAB — CK: Total CK: 776 U/L — ABNORMAL HIGH (ref 49–397)

## 2023-11-13 LAB — MAGNESIUM: Magnesium: 2 mg/dL (ref 1.7–2.4)

## 2023-11-13 LAB — HEPARIN LEVEL (UNFRACTIONATED): Heparin Unfractionated: 0.46 [IU]/mL (ref 0.30–0.70)

## 2023-11-13 LAB — PHOSPHORUS: Phosphorus: 3.3 mg/dL (ref 2.5–4.6)

## 2023-11-13 MED ORDER — GABAPENTIN 100 MG PO CAPS
100.0000 mg | ORAL_CAPSULE | Freq: Two times a day (BID) | ORAL | Status: DC
  Administered 2023-11-13 – 2023-11-15 (×6): 100 mg via ORAL
  Filled 2023-11-13 (×6): qty 1

## 2023-11-13 MED ORDER — APIXABAN 5 MG PO TABS
5.0000 mg | ORAL_TABLET | Freq: Two times a day (BID) | ORAL | Status: DC
  Administered 2023-11-13 – 2023-11-17 (×8): 5 mg via ORAL
  Filled 2023-11-13 (×8): qty 1

## 2023-11-13 NOTE — Progress Notes (Signed)
PROGRESS NOTE    Manuel Winters  ZOX:[id number] DOB: 04/30/1946 DOA: 11/05/2023 PCP: Oletha Blend, MD   Brief Narrative:  This 78 yrs old Male with PMH significant for hypertension, diabetes type 2, hyperlipidemia, CKD 3, recent ED and outpatient visits for right leg pain, who presents due to persistent symptoms. Initially reports that his pain has been ongoing for about 1 month. He was chasing a truck and tripped and fell, reports about a week later developed pain from below the knee down to the foot which is intermittent and severe. He denies any numbness or weakness in the foot but does have deficits on exam. Right leg has been cool. Pain has been progressive especially in the past week, has been trying to ambulate with the use of crutches but in the past few days not able to get up much at all. Had no known history of PAD or other ASCVD prior, not taking antiplatelets or anticoagulants prior to this admission. No history of bleeding issues. He was admitted for further evaluation.   **Interim History Subsequently he underwent a right CFA endarterectomy with bovine patch angioplasty, right lower extremity thrombectomy abdominal popliteal and tibial thrombectomy of the AT, PT and peroneal arteries and 4 compartment fasciotomy with subsequent medial fasciotomy and closure performed on 11/09/2022.  *11/12/23: Blood Count dropped overnight so he will be typed and screend and transfused 1 unit of pRBC's. Having quite a bit of pain so will need to continue Analgesics. PT/OT Recommending Home Health.   On 11/13/2023 is doing much better.  Unfortunately spiked a slight temperature so we will obtain blood cultures, chest x-ray, urinalysis and obtain lactic acid level and procalcitonin level in the morning.  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 PAD with bilateral SFA occlusions: -Patient has chronic PAD now presented with  worsening right leg symptoms. -Etiology of this possibly atherothrombotic /embolic from underlying PAD v less likely cardioembolic with newly identified Aflutter.  RLE below the knee is cold with no pedal pulses on the Right, there is numbness in stocking distribution below the ankle on the R side, and strength is 3/5 with dorsiflexion at the ankle, no tissue loss.  -CTA with complete occlusion at R popliteal with no runoff to the foot, additional occlusions R SFA origin + prox SFA with reconstitution, and L CFA with reconstitution and 3 vessel runoff on the left.  -Vascular surgery Dr. Lenell Antu recommends for transfer to [facility] and initiation of heparin drip.  -Drip was continued and now being changed to DOAC at the direction of the vascular surgery team -Serial neurovascular checks of the right lower extremity -Adequate pain control. -Patient underwent Right CFA endarterectomy with bovine patch angiopasty, RLE thrombectomy, cutdown popliteal with tibial thrombectomy of the AT, PT, peroneal arteries and 4 compartment fasciotomies 11/06/2023 by Dr. Chestine Spore  -Continue Adequate Pain control.  -Subsequently patient underwent medial fasciotomy and closure performed 11/09/2022. -Pain control po Acetaminophen 1000 mg po q6hprn Mild Pain, IV Hydromorphone 0.5 mg q4hprn Breakthrough Pain; C/w Oxycodone IR 5 mg q4hprn Moderate Pain or 10 mg po q4hprn Severe Pain  -PT recommending Home Health and OT recommending SNF -Vascular evaluated and he continues to have brisk Right peroneal doppler signal -Continue to Monitor and continue to mobilize with therapy -Patient continues to have right lower extremity motor deficits consistent with his initial presentation due to prolonged ischemia and PT OT recommends home health at this point   Lactic Acidosis:  -  Continue with IV fluids.  -Lactic acid normalized. Recent Labs  Lab 11/05/23 1952 11/05/23 2135 11/08/23 0259  LATICACIDVEN 1.9 2.1* 1.1    Rhabdomyolysis:   -Elevated CKD levels. Continued with IV fluids with NS at 75 mL/hr -CK Level trend went from 5733 -> 12,526 -> 9430 -> 3896 -> 1646 -> 776 -CK level continues to remain elevated , trend CK level.   Acute myocardial injury: -No history of chest pain.  High-sensitivity troponin 22 -> 26.   -EKG with a flutter with no acute ischemic changes. -Likely demand event in the setting of arrhythmia, management directed at underlying A-fib above.   Constipation -Initiated Bowel regimen 17 grams po BID, Senna-Docusate 1 tab po BID, and Bisacodyl 10 mg RC Daily PRN Moderate Constipation   Persistent Atrial Flutter -Patient presented with atrial flutter, started on IV diltiazem drip. -Echo showed LVEF 55 to 60%, TSH normal -Heart rate now controlled, Continue Lopressor 25 mg twice daily. -Cardizem discontinued. -CHADS2 score 5. Patient on IV heparin with plan to transition to DOAC before discharge when okay with vascular surgery. -If patient continued to remain in A-fib,  Consider outpatient DCCV after 3 weeks of uninterrupted AC. -Cardiology has signed off the case now but if necessary will reconsult   AKI on CKD stage IIIa Metabolic Acidosis -Improving with IV fluids and will reduce IVF to 75 mL/hr as CK is improving -Continue Holding chlorthalidone and losartan.  -BUN/Cr Trend: Recent Labs  Lab 11/06/23 0411 11/07/23 0345 11/08/23 0259 11/10/23 0312 11/11/23 0304 11/12/23 0312 11/13/23 0326  BUN 35* 29* 23 38* 29* 14 11  CREATININE 1.59* 1.50* 1.48* 1.81* 1.49* 1.19 1.17  -Has a Slight Metabolic Acidosis with a CO2 of 18, AG of 10, Chloride Level of 103 -Avoid Nephrotoxic Medications, Contrast Dyes, Hypotension and Dehydration to Ensure Adequate Renal Perfusion and will need to Renally Adjust Meds -Continue to Monitor and Trend Renal Function carefully and repeat CMP in the AM   ABLA/Dilutional Drop -Hgb/Hct Trend: Recent Labs  Lab 11/07/23 0345 11/08/23 0259 11/09/23 0334  11/10/23 0312 11/11/23 0304 11/12/23 0312 11/13/23 0326  HGB 8.9* 9.1* 8.2* 8.5* 7.6* 6.3* 8.4*  HCT 26.0* 26.9* 24.5* 25.4* 22.6* 19.6* 25.3*  MCV 87.0 87.1 88.8 88.5 89.0 93.8 90.7  -Checked Anemia Panel and showed and Iron Level of 44, UBIC of 181, TIBC of 225, Saturation Ratio of 20, Ferritin Level of 251, Folate Level of 12.5, and 707 -Typed and Screend and Transfused 1 unit pRBCs given that Hgb dropped to 6.3 -Continue to Monitor for S/Sx of Bleeding; No overt bleeding ntoed -Repeat CBC in the AM   Fever -Unclear Etiology and spiked a Temp of 100.4 -Check Blood Cx x2 -Check CXR in the AM -Check U/A  -Check procalcitonin level and lactic acid level in the morning -WBC Trend: Recent Labs  Lab 11/07/23 0345 11/08/23 0259 11/09/23 0334 11/10/23 0312 11/11/23 0304 11/12/23 0312 11/13/23 0326  WBC 9.4 8.6 8.6 8.1 7.8 7.2 9.0  -Continue to Monitor and Trend and repeat CBC in the AM -Hold Abx at this point but low threshold to initiate   Essential Hypertension: -BP parameters have improved.  -Continue Metoprolol Tartrate 25 mg twice daily. -Continue to Monitor BP per Protocol -Last BP reading was 124/63   DM Type 2  -Resume SSI with very sensitive NovoLog sliding scale AC -HbA1c was 7.2 -Hold PO DM meds. -Continue monitor CBG trend: Recent Labs  Lab 11/12/23 0981 11/12/23 1208 11/12/23 1646 11/12/23 2135 11/13/23 1914  11/13/23 1244 11/13/23 1642  GLUCAP 122* 118* 140* 119* 142* 101* 120*   Hypomagnesemia -Patient's Mag Level Trend: Recent Labs  Lab 11/05/23 1834 11/06/23 0411 11/07/23 0345 11/10/23 0312 11/11/23 0304 11/12/23 0312 11/13/23 0326  MG 1.8 1.8 1.7 1.8 1.6* 1.8 2.0  -Replete with IV Mag Sulfate 2 grams yesterday -Continue to Monitor and Replete as Necessary -Repeat Mag in the AM    Hypercalcemia -Resolved with IV fluids.  -Calcium Level Trend: Recent Labs  Lab 11/06/23 0411 11/07/23 0345 11/08/23 0259 11/10/23 0312  11/11/23 0304 11/12/23 0312 11/13/23 0326  CALCIUM 9.5 9.1 9.5 9.4 8.9 8.2* 9.1  -Continue to Monitor and Trend and repeat CMP in the AM   GERD/GI Prophylaxis -Continue with PPI with Pantoprazole 40 mg po daily  Hypoalbuminemia -Patient's Albumin Trend: Recent Labs  Lab 11/05/23 2135 11/07/23 0345 11/10/23 0312 11/12/23 0312 11/13/23 0326  ALBUMIN 3.7 3.3* 2.8* 2.7* 2.9*  -Continue to Monitor and Trend and repeat CMP in the AM   DVT prophylaxis: Quale with heparin drip will be transitioning to DOAC    Code Status: Full Code Family Communication: Discussed with friend at bedside  Disposition Plan:  Level of care: Progressive Status is: Inpatient Remains inpatient appropriate because: Further clinical improvement and anticipate discharge in next 24 to 48 hours   Consultants:  Vascular Surgery  Procedures:  As delineated as above  Antimicrobials:  Anti-infectives (From admission, onward)    Start     Dose/Rate Route Frequency Ordered Stop   11/10/23 0600  ceFAZolin (ANCEF) IVPB 1 g/50 mL premix       Note to Pharmacy: Send with pt to OR   1 g 100 mL/hr over 30 Minutes Intravenous On call 11/09/23 0802 11/10/23 1026   11/06/23 0600  penicillin g benzathine (BICILLIN LA) 1200000 UNIT/2ML injection 2.4 Million Units  Status:  Discontinued        2.4 Million Units Intramuscular  Once 11/06/23 0551 11/06/23 0551   11/06/23 0600  cefTRIAXone (ROCEPHIN) injection 500 mg  Status:  Discontinued        500 mg Intramuscular  Once 11/06/23 0551 11/06/23 0551       Subjective: Seen and examined at bedside and he is much more comfortable today not complaining as much pain.  No nausea vomiting.  Feels okay.  Denies any lightheadedness or dizziness.  No other concerns or complaints at this time and blood count is appropriately recovered.  Objective: Vitals:   11/13/23 0351 11/13/23 0841 11/13/23 1248 11/13/23 1648  BP: 119/61 107/68 116/63 124/63  Pulse: 76 99 94 100  Resp:  17 19 17 19   Temp: 97.9 F (36.6 C) 98 F (36.7 C) 99 F (37.2 C) (!) 100.4 F (38 C)  TempSrc: Oral Oral Oral Oral  SpO2: 96% 98% 100% 95%  Weight:      Height:        Intake/Output Summary (Last 24 hours) at 11/13/2023 1811 Last data filed at 11/13/2023 1649 Gross per 24 hour  Intake 2954.7 ml  Output 400 ml  Net 2554.7 ml   Filed Weights   11/05/23 1731 11/06/23 0915  Weight: 73.4 kg 72.6 kg   Examination: Physical Exam:  Constitutional: Thin chronically ill-appearing elderly African-American male who appears more comfortable today and is not in much pain Respiratory: Diminished to auscultation bilaterally, no wheezing, rales, rhonchi or crackles. Normal respiratory effort and patient is not tachypenic. No accessory muscle use.  Unlabored breathing Cardiovascular: RRR, no murmurs / rubs /  gallops. S1 and S2 auscultated.  Has a mild extremity noted .  Abdomen: Soft, non-tender, non-distended. Bowel sounds positive.  GU: Deferred. Musculoskeletal: No clubbing / cyanosis of digits/nails. No joint deformity upper and lower extremities.  Skin: Leg incisions and staples noted.  Neurologic: CN 2-12 grossly intact with no focal deficits. Romberg sign and cerebellar reflexes not assessed.  Psychiatric: He is awake and alert in no acute distress  Data Reviewed: I have personally reviewed following labs and imaging studies  CBC: Recent Labs  Lab 11/09/23 0334 11/10/23 0312 11/11/23 0304 11/12/23 0312 11/13/23 0326  WBC 8.6 8.1 7.8 7.2 9.0  NEUTROABS  --   --   --  4.8 5.8  HGB 8.2* 8.5* 7.6* 6.3* 8.4*  HCT 24.5* 25.4* 22.6* 19.6* 25.3*  MCV 88.8 88.5 89.0 93.8 90.7  PLT 160 195 203 199 237   Basic Metabolic Panel: Recent Labs  Lab 11/07/23 0345 11/08/23 0259 11/10/23 0312 11/11/23 0304 11/12/23 0312 11/13/23 0326  NA 134* 137 134* 136 131* 141  K 4.1 3.6 3.9 3.7 3.7 4.1  CL 105 103 101 104 103 112*  CO2 19* 21* 22 22 18* 22  GLUCOSE 156* 151* 146* 124* 129*  129*  BUN 29* 23 38* 29* 14 11  CREATININE 1.50* 1.48* 1.81* 1.49* 1.19 1.17  CALCIUM 9.1 9.5 9.4 8.9 8.2* 9.1  MG 1.7  --  1.8 1.6* 1.8 2.0  PHOS 2.7  --  4.0 2.7 2.3* 3.3   GFR: Estimated Creatinine Clearance: 50.3 mL/min (by C-G formula based on SCr of 1.17 mg/dL). Liver Function Tests: Recent Labs  Lab 11/07/23 0345 11/10/23 0312 11/12/23 0312 11/13/23 0326  AST 183* 232* 100* 64*  ALT 21 27 19 19   ALKPHOS 48 45 44 50  BILITOT 0.8 1.0 0.9 1.1  PROT 7.0 7.1 6.2* 6.7  ALBUMIN 3.3* 2.8* 2.7* 2.9*   No results for input(s): "LIPASE", "AMYLASE" in the last 168 hours. No results for input(s): "AMMONIA" in the last 168 hours. Coagulation Profile: No results for input(s): "INR", "PROTIME" in the last 168 hours. Cardiac Enzymes: Recent Labs  Lab 11/07/23 0345 11/08/23 0259 11/11/23 0304 11/12/23 0928 11/13/23 0326  CKTOTAL 12,526* 9,430* 3,896* 1,646* 776*   BNP (last 3 results) No results for input(s): "PROBNP" in the last 8760 hours. HbA1C: No results for input(s): "HGBA1C" in the last 72 hours. CBG: Recent Labs  Lab 11/12/23 1646 11/12/23 2135 11/13/23 0634 11/13/23 1244 11/13/23 1642  GLUCAP 140* 119* 142* 101* 120*   Lipid Profile: No results for input(s): "CHOL", "HDL", "LDLCALC", "TRIG", "CHOLHDL", "LDLDIRECT" in the last 72 hours. Thyroid Function Tests: No results for input(s): "TSH", "T4TOTAL", "FREET4", "T3FREE", "THYROIDAB" in the last 72 hours. Anemia Panel: Recent Labs    11/12/23 0312  VITAMINB12 707  FOLATE 12.5  FERRITIN 251  TIBC 225*  IRON 44*  RETICCTPCT 3.9*   Sepsis Labs: Recent Labs  Lab 11/08/23 0259  LATICACIDVEN 1.1   Recent Results (from the past 240 hours)  Surgical pcr screen     Status: Abnormal   Collection Time: 11/06/23  8:44 AM   Specimen: Nasal Mucosa; Nasal Swab  Result Value Ref Range Status   MRSA, PCR NEGATIVE NEGATIVE Final   Staphylococcus aureus POSITIVE (A) NEGATIVE Final    Comment: (NOTE) The Xpert  SA Assay (FDA approved for NASAL specimens in patients 41 years of age and older), is one component of a comprehensive surveillance program. It is not intended to diagnose infection nor  to guide or monitor treatment. Performed at [facility], [address], [place], [place] [address]     Radiology Studies: No results found.  Scheduled Meds:  sodium chloride   Intravenous Once   gabapentin  100 mg Oral BID   insulin aspart  0-6 Units Subcutaneous TID WC   latanoprost  1 drop Both Eyes QHS   metoprolol tartrate  25 mg Oral BID   pantoprazole  40 mg Oral Daily   polyethylene glycol  17 g Oral BID   senna-docusate  1 tablet Oral BID   sodium chloride flush  3 mL Intravenous Q12H   Continuous Infusions:  sodium chloride 75 mL/hr at 11/12/23 2104   heparin 1,300 Units/hr (11/12/23 0432)    LOS: 8 days   Marguerita Merles, DO [facility] Available via Epic secure chat 7am-7pm After these hours, please refer to coverage provider listed on [email or web address] 11/13/2023, 6:11 PM

## 2023-11-13 NOTE — Progress Notes (Signed)
PHARMACY - ANTICOAGULATION CONSULT NOTE  Pharmacy Consult for heparin > apixaban Indication:  arterial occlusion  Labs: Recent Labs    11/11/23 0304 11/12/23 0312 11/12/23 0928 11/13/23 0326  HGB 7.6* 6.3*  --  8.4*  HCT 22.6* 19.6*  --  25.3*  PLT 203 199  --  237  HEPARINUNFRC 0.25* 0.37  --  0.46  CREATININE 1.49* 1.19  --  1.17  CKTOTAL 3,896*  --  1,646* 776*   Assessment: 77yo male therapeutic on heparin for arterial occlusion S/p thrombectomy  Heparin drip rate 1300 uts/hr with heparin level at goal 0.46.  hgb improved today 8.4 s/p PRBC x1 1/23  PM update: transition to apixaban. Meets criteria for standard 5mg  BID dosing.  Goal of Therapy:  Therapeutic anticoagulation   Plan:  D/c heparin drip Start apixaban 5mg  PO bid Will provide DOAC education and co-pay check prior to discharge   Loralee Pacas, PharmD, BCPS Clinical Pharmacist 11/13/2023 6:11 PM

## 2023-11-13 NOTE — Progress Notes (Signed)
PHARMACY - ANTICOAGULATION CONSULT NOTE  Pharmacy Consult for heparin Indication:  arterial occlusion  Labs: Recent Labs    11/11/23 0304 11/12/23 0312 11/12/23 0928 11/13/23 0326  HGB 7.6* 6.3*  --  8.4*  HCT 22.6* 19.6*  --  25.3*  PLT 203 199  --  237  HEPARINUNFRC 0.25* 0.37  --  0.46  CREATININE 1.49* 1.19  --  1.17  CKTOTAL 3,896*  --  1,646* 776*   Assessment: 78yo male therapeutic on heparin for arterial occlusion S/p thrombectomy  Heparin drip rate 1300 uts/hr with heparin level at goal 0.46.  hgb improved today 8.4 s/p PRBC x1 1/23  Goal of Therapy:  Heparin level 0.3-0.7 units/ml   Plan:  Continue heparin at 1300 units / hr Daily heparin level, CBC  F/u plan change to DOAC    Leota Sauers Pharm.D. CPP, BCPS Clinical Pharmacist [phone] 11/13/2023 2:52 PM

## 2023-11-13 NOTE — Progress Notes (Addendum)
  Progress Note    11/13/2023 8:24 AM 3 Days Post-Op  Subjective:  still having right foot pain intermittently   Vitals:   11/12/23 2327 11/13/23 0351  BP: 120/65 119/61  Pulse: 68 76  Resp: 12 17  Temp: 98.5 F (36.9 C) 97.9 F (36.6 C)  SpO2: 98% 96%   Physical Exam: Cardiac:  regular Lungs:  non labored Incisions:  right groin incision remains intact and healing well; right leg incisions look good. Minimal SS drainage on blankets. No appreciable active draining Extremities: Brisk doppler right peroneal signal. Foot warm. Some motor intact Abdomen:  soft Neurologic: alert and oriented  CBC    Component Value Date/Time   WBC 9.0 11/13/2023 0326   RBC 2.79 (L) 11/13/2023 0326   HGB 8.4 (L) 11/13/2023 0326   HCT 25.3 (L) 11/13/2023 0326   PLT 237 11/13/2023 0326   MCV 90.7 11/13/2023 0326   MCH 30.1 11/13/2023 0326   MCHC 33.2 11/13/2023 0326   RDW 14.0 11/13/2023 0326   LYMPHSABS 1.7 11/13/2023 0326   MONOABS 1.1 (H) 11/13/2023 0326   EOSABS 0.3 11/13/2023 0326   BASOSABS 0.0 11/13/2023 0326    BMET    Component Value Date/Time   NA 141 11/13/2023 0326   K 4.1 11/13/2023 0326   CL 112 (H) 11/13/2023 0326   CO2 22 11/13/2023 0326   GLUCOSE 129 (H) 11/13/2023 0326   BUN 11 11/13/2023 0326   CREATININE 1.17 11/13/2023 0326   CALCIUM 9.1 11/13/2023 0326   GFRNONAA >60 11/13/2023 0326    INR    Component Value Date/Time   INR 1.1 11/06/2023 0411     Intake/Output Summary (Last 24 hours) at 11/13/2023 0824 Last data filed at 11/13/2023 0500 Gross per 24 hour  Intake 3074.7 ml  Output 600 ml  Net 2474.7 ml     Assessment/Plan:  78 y.o. male is s/p  Right CFA endarterectomy with bovine patch angiopasty, RLE thrombectomy, cutdown popliteal with tibial thrombectomy of the AT, PT, peroneal arteries and 4 compartment fasciotomies 11/06/2023 by Dr. Chestine Spore and closure of right medial fasciotomy 11/10/2023 by Dr. Myra Gianotti     Still having intermittent right  foot pain like prior to his surgery RLE remains well perfused and warm with brisk doppler Pero signal Right groin incision is intact and well appearing No signs of active bleeding Hgb stable at 8.4 post transfusion yesterday Recommend transition to DOAC Continue working with PT/OT. They are recommending HH Stable from vascular standpoint for d/c Will arrange follow up in 2-3 weeks for incision check staple removal   Graceann Congress, PA-C [facility] [phone] 11/13/2023 8:24 AM  I have seen and evaluated the patient. I agree with the PA note as documented above.  Patient is status post right common femoral endarterectomy with bovine patch including right lower extremity thrombectomy requiring popliteal cutdown with tibial thrombectomy and 4 compartment fasciotomies on 11/06/2023 by myself.  Right groin looks good.  Fasciotomies are now closed.  He has brisk AT and peroneal Doppler signal in the right foot.  Okay to continue mobilizing with therapy.  He can be transition to DOAC from our standpoint.  Has right lower extremity motor deficits consistent with his initial presentation due to prolonged ischemia.  Cephus Shelling, MD [facility] [place] Office: [phone]

## 2023-11-13 NOTE — Progress Notes (Signed)
Physical Therapy Treatment Patient Details Name: Manuel Winters MRN: [id number] DOB: 07-25-1946 Today's Date: 11/13/2023   History of Present Illness Pt is a 78 y/o male presenting 11/05/23 with persistent right lower extremity pain in setting of limb ischemia. Underwent R common femoral endarterectomy, thrombectomy via femoral approach, popliteal cutdown w/ tibial thrombectomy and 4 compartment fasciotomies on 1/17. PMH: HTN, DM2, HLD, CKD    PT Comments  Pt required supervision bed mobility, CGA transfers, and CGA amb 80' with RW. Pt primarily maintaining NWB RLE due to pain. HR sustained in 140s during amb. Resting HR in 90s. Pt continues to demonstrate good mobility progress. Pt in recliner with feet elevated at end of session.     If plan is discharge home, recommend the following: A little help with walking and/or transfers;A little help with bathing/dressing/bathroom;Assistance with cooking/housework;Help with stairs or ramp for entrance;Assist for transportation   Can travel by private vehicle        Equipment Recommendations  Rolling walker (2 wheels);BSC/3in1;Other (comment) (transport wheelchair)    Recommendations for Other Services       Precautions / Restrictions Precautions Precautions: Fall;Other (comment) Precaution Comments: watch HR     Mobility  Bed Mobility Overal bed mobility: Needs Assistance Bed Mobility: Supine to Sit, Rolling Rolling: Modified independent (Device/Increase time)   Supine to sit: HOB elevated, Supervision, Used rails          Transfers Overall transfer level: Needs assistance Equipment used: Rolling walker (2 wheels) Transfers: Sit to/from Stand Sit to Stand: Contact guard assist           General transfer comment: increased time, cues for sequencing    Ambulation/Gait Ambulation/Gait assistance: Contact guard assist Gait Distance (Feet): 80 Feet Assistive device: Rolling walker (2 wheels) Gait Pattern/deviations:  Step-to pattern, Trunk flexed, Antalgic Gait velocity: decreased Gait velocity interpretation: <1.31 ft/sec, indicative of household ambulator   General Gait Details: Primarily maintaining NWB RLE. Able to do TDWB 25% of time with encouragement/cues. Cues for proximity to RW as pt tends to get too close to front.   Stairs             Wheelchair Mobility     Tilt Bed    Modified Rankin (Stroke Patients Only)       Balance Overall balance assessment: Needs assistance Sitting-balance support: No upper extremity supported, Feet supported Sitting balance-Leahy Scale: Good     Standing balance support: Bilateral upper extremity supported, Reliant on assistive device for balance, During functional activity Standing balance-Leahy Scale: Poor Standing balance comment: maintaining NWB to very little weight on R LE                            Cognition Arousal: Alert Behavior During Therapy: Anxious (reguarding mobility) Overall Cognitive Status: Within Functional Limits for tasks assessed                                          Exercises      General Comments General comments (skin integrity, edema, etc.): HR sustained in 140s during amb. Brief spikes into 150s. Resting HR in 90s.      Pertinent Vitals/Pain Pain Assessment Pain Assessment: Faces Faces Pain Scale: Hurts whole lot Pain Location: R leg with dependency Pain Descriptors / Indicators: Discomfort, Grimacing, Guarding Pain Intervention(s): Monitored during session, Repositioned  Home Living                          Prior Function            PT Goals (current goals can now be found in the care plan section) Acute Rehab PT Goals Patient Stated Goal: home Progress towards PT goals: Progressing toward goals    Frequency    Min 1X/week      PT Plan      Co-evaluation              AM-PAC PT "6 Clicks" Mobility   Outcome Measure  Help needed  turning from your back to your side while in a flat bed without using bedrails?: None Help needed moving from lying on your back to sitting on the side of a flat bed without using bedrails?: None Help needed moving to and from a bed to a chair (including a wheelchair)?: A Little Help needed standing up from a chair using your arms (e.g., wheelchair or bedside chair)?: A Little Help needed to walk in hospital room?: A Little Help needed climbing 3-5 steps with a railing? : Total 6 Click Score: 18    End of Session Equipment Utilized During Treatment: Gait belt Activity Tolerance: Patient limited by pain Patient left: in chair;with call bell/phone within reach;with family/visitor present;with chair alarm set Nurse Communication: Mobility status PT Visit Diagnosis: Unsteadiness on feet (R26.81);Other abnormalities of gait and mobility (R26.89);Muscle weakness (generalized) (M62.81);Difficulty in walking, not elsewhere classified (R26.2);Pain Pain - Right/Left: Right Pain - part of body: Leg     Time: 1610-9604 PT Time Calculation (min) (ACUTE ONLY): 25 min  Charges:    $Gait Training: 23-37 mins PT General Charges $$ ACUTE PT VISIT: 1 Visit                     Ferd Glassing., PT  Office # [phone]    Ilda Foil 11/13/2023, 11:28 AM

## 2023-11-13 NOTE — Discharge Instructions (Signed)
[facility]  Discharge instructions  Lower Extremity Bypass Surgery  Please refer to the following instruction for your post-procedure care. Your surgeon or physician assistant will discuss any changes with you.  Activity  You are encouraged to walk as much as you can. You can slowly return to normal activities during the month after your surgery. Avoid strenuous activity and heavy lifting until your doctor tells you it's OK. Avoid activities such as vacuuming or swinging a golf club. Do not drive until your doctor give the OK and you are no longer taking prescription pain medications. It is also normal to have difficulty with sleep habits, eating and bowel movement after surgery. These will go away with time.  Bathing/Showering  Shower daily after you go home. Do not soak in a bathtub, hot tub, or swim until the incision heals completely.  Incision Care  Clean your incision with mild soap and water. Shower every day. Pat the area dry with a clean towel. You do not need a bandage unless otherwise instructed. Do not apply any ointments or creams to your incision. If you have open wounds you will be instructed how to care for them or a visiting nurse may be arranged for you. If you have staples or sutures along your incision they will be removed at your post-op appointment. You may have skin glue on your incision. Do not peel it off. It will come off on its own in about one week.  Wash the groin wound with soap and water daily and pat dry. (No tub bath-only shower)  Then put a dry gauze or washcloth in the groin to keep this area dry to help prevent wound infection.  Do this daily and as needed.  Do not use Vaseline or neosporin on your incisions.  Only use soap and water on your incisions and then protect and keep dry.  Diet  Resume your normal diet. There are no special food restrictions following this procedure. A low fat/ low cholesterol diet is  recommended for all patients with vascular disease. In order to heal from your surgery, it is CRITICAL to get adequate nutrition. Your body requires vitamins, minerals, and protein. Vegetables are the best source of vitamins and minerals. Vegetables also provide the perfect balance of protein. Processed food has little nutritional value, so try to avoid this.  Medications  Resume taking all your medications unless your doctor or physician assistant tells you not to. If your incision is causing pain, you may take over-the-counter pain relievers such as acetaminophen (Tylenol). If you were prescribed a stronger pain medication, please aware these medication can cause nausea and constipation. Prevent nausea by taking the medication with a snack or meal. Avoid constipation by drinking plenty of fluids and eating foods with high amount of fiber, such as fruits, vegetables, and grains. Take Colace 100 mg (an over-the-counter stool softener) twice a day as needed for constipation.  Do not take Tylenol if you are taking prescription pain medications.  Follow Up  Our office will schedule a follow up appointment 2-3 weeks following discharge.  Please call us immediately for any of the following conditions  Severe or worsening pain in your legs or feet while at rest or while walking Increase pain, redness, warmth, or drainage (pus) from your incision site(s) Fever of 101 degree or higher The swelling in your leg with the bypass suddenly worsens and becomes more painful than when you were in the hospital If you have  been instructed to feel your graft pulse then you should do so every day. If you can no longer feel this pulse, call the office immediately. Not all patients are given this instruction.  Leg swelling is common after leg bypass surgery.  The swelling should improve over a few months following surgery. To improve the swelling, you may elevate your legs above the level of your heart while you are  sitting or resting. Your surgeon or physician assistant may ask you to apply an ACE wrap or wear compression (TED) stockings to help to reduce swelling.  Reduce your risk of vascular disease  Stop smoking. If you would like help call [facility] at 1-800-QUIT-NOW (940-798-3487) or [place] at 236-663-9331.  Manage your cholesterol Maintain a desired weight Control your diabetes weight Control your diabetes Keep your blood pressure down  If you have any questions, please call the office at [phone]    Information on my medicine - ELIQUIS (apixaban)  Why was Eliquis prescribed for you? Eliquis was prescribed for you to reduce the risk of a blood clot forming that can cause a stroke if you have a medical condition called atrial fibrillation (a type of irregular heartbeat).  What do You need to know about Eliquis ? Take your Eliquis TWICE DAILY - one tablet in the morning and one tablet in the evening with or without food. If you have difficulty swallowing the tablet whole please discuss with your pharmacist how to take the medication safely.  Take Eliquis exactly as prescribed by your doctor and DO NOT stop taking Eliquis without talking to the doctor who prescribed the medication.  Stopping may increase your risk of developing a stroke.  Refill your prescription before you run out.  After discharge, you should have regular check-up appointments with your healthcare provider that is prescribing your Eliquis.  In the future your dose may need to be changed if your kidney function or weight changes by a significant amount or as you get older.  What do you do if you miss a dose? If you miss a dose, take it as soon as you remember on the same day and resume taking twice daily.  Do not take more than one dose of ELIQUIS at the same time to make up a missed dose.  Important Safety Information A possible side effect of Eliquis is bleeding. You should call your healthcare  provider right away if you experience any of the following: ? Bleeding from an injury or your nose that does not stop. ? Unusual colored urine (red or dark brown) or unusual colored stools (red or black). ? Unusual bruising for unknown reasons. ? A serious fall or if you hit your head (even if there is no bleeding).  Some medicines may interact with Eliquis and might increase your risk of bleeding or clotting while on Eliquis. To help avoid this, consult your healthcare provider or pharmacist prior to using any new prescription or non-prescription medications, including herbals, vitamins, non-steroidal anti-inflammatory drugs (NSAIDs) and supplements.  This website has more information on Eliquis (apixaban): http://www.eliquis.com/eliquis/home

## 2023-11-14 DIAGNOSIS — E8809 Other disorders of plasma-protein metabolism, not elsewhere classified: Secondary | ICD-10-CM | POA: Diagnosis not present

## 2023-11-14 DIAGNOSIS — I70209 Unspecified atherosclerosis of native arteries of extremities, unspecified extremity: Secondary | ICD-10-CM | POA: Diagnosis not present

## 2023-11-14 LAB — COMPREHENSIVE METABOLIC PANEL
ALT: 19 U/L (ref 0–44)
AST: 42 U/L — ABNORMAL HIGH (ref 15–41)
Albumin: 2.6 g/dL — ABNORMAL LOW (ref 3.5–5.0)
Alkaline Phosphatase: 46 U/L (ref 38–126)
Anion gap: 8 (ref 5–15)
BUN: 8 mg/dL (ref 8–23)
CO2: 20 mmol/L — ABNORMAL LOW (ref 22–32)
Calcium: 8.8 mg/dL — ABNORMAL LOW (ref 8.9–10.3)
Chloride: 109 mmol/L (ref 98–111)
Creatinine, Ser: 1.12 mg/dL (ref 0.61–1.24)
GFR, Estimated: >60 mL/min (ref >60)
Glucose, Bld: 128 mg/dL — ABNORMAL HIGH (ref 70–99)
Potassium: 3.3 mmol/L — ABNORMAL LOW (ref 3.5–5.1)
Sodium: 137 mmol/L (ref 135–145)
Total Bilirubin: 0.9 mg/dL (ref 0.0–1.2)
Total Protein: 6.3 g/dL — ABNORMAL LOW (ref 6.5–8.1)

## 2023-11-14 LAB — CBC WITH DIFFERENTIAL/PLATELET
Abs Immature Granulocytes: 0.03 10*3/uL (ref 0.00–0.07)
Basophils Absolute: 0 10*3/uL (ref 0.0–0.1)
Basophils Relative: 0 %
Eosinophils Absolute: 0.3 10*3/uL (ref 0.0–0.5)
Eosinophils Relative: 4 %
HCT: 24.1 % — ABNORMAL LOW (ref 39.0–52.0)
Hemoglobin: 8.1 g/dL — ABNORMAL LOW (ref 13.0–17.0)
Immature Granulocytes: 0 %
Lymphocytes Relative: 17 %
Lymphs Abs: 1.3 10*3/uL (ref 0.7–4.0)
MCH: 29.9 pg (ref 26.0–34.0)
MCHC: 33.6 g/dL (ref 30.0–36.0)
MCV: 88.9 fL (ref 80.0–100.0)
Monocytes Absolute: 0.8 10*3/uL (ref 0.1–1.0)
Monocytes Relative: 10 %
Neutro Abs: 5.2 10*3/uL (ref 1.7–7.7)
Neutrophils Relative %: 69 %
Platelets: 234 10*3/uL (ref 150–400)
RBC: 2.71 MIL/uL — ABNORMAL LOW (ref 4.22–5.81)
RDW: 14.2 % (ref 11.5–15.5)
WBC: 7.5 10*3/uL (ref 4.0–10.5)
nRBC: 0 % (ref 0.0–0.2)

## 2023-11-14 LAB — URINALYSIS, COMPLETE (UACMP) WITH MICROSCOPIC
Bacteria, UA: NONE SEEN
Bilirubin Urine: NEGATIVE
Glucose, UA: NEGATIVE mg/dL
Hgb urine dipstick: NEGATIVE
Ketones, ur: NEGATIVE mg/dL
Leukocytes,Ua: NEGATIVE
Nitrite: NEGATIVE
Protein, ur: NEGATIVE mg/dL
Specific Gravity, Urine: 1.017 (ref 1.005–1.030)
pH: 5 (ref 5.0–8.0)

## 2023-11-14 LAB — PHOSPHORUS: Phosphorus: 3 mg/dL (ref 2.5–4.6)

## 2023-11-14 LAB — GLUCOSE, CAPILLARY
Glucose-Capillary: 121 mg/dL — ABNORMAL HIGH (ref 70–99)
Glucose-Capillary: 139 mg/dL — ABNORMAL HIGH (ref 70–99)
Glucose-Capillary: 110 mg/dL — ABNORMAL HIGH (ref 70–99)
Glucose-Capillary: 100 mg/dL — ABNORMAL HIGH (ref 70–99)

## 2023-11-14 LAB — CK: Total CK: 459 U/L — ABNORMAL HIGH (ref 49–397)

## 2023-11-14 LAB — MAGNESIUM: Magnesium: 1.6 mg/dL — ABNORMAL LOW (ref 1.7–2.4)

## 2023-11-14 LAB — PROCALCITONIN: Procalcitonin: <0.1 ng/mL

## 2023-11-14 LAB — LACTIC ACID, PLASMA: Lactic Acid, Venous: 0.8 mmol/L (ref 0.5–1.9)

## 2023-11-14 MED ORDER — POTASSIUM CHLORIDE CRYS ER 20 MEQ PO TBCR
40.0000 meq | EXTENDED_RELEASE_TABLET | ORAL | Status: AC
  Administered 2023-11-14 (×2): 40 meq via ORAL
  Filled 2023-11-14 (×2): qty 2

## 2023-11-14 MED ORDER — SODIUM CHLORIDE 0.9 % IV SOLN: INTRAVENOUS | Status: AC

## 2023-11-14 MED ORDER — MAGNESIUM SULFATE 2 GM/50ML IV SOLN
2.0000 g | Freq: Once | INTRAVENOUS | Status: AC
  Administered 2023-11-14: 2 g via INTRAVENOUS
  Filled 2023-11-14: qty 50

## 2023-11-14 NOTE — Progress Notes (Signed)
Occupational Therapy Treatment Patient Details Name: Manuel Winters MRN: [id number] DOB: 1946/10/14 Today's Date: 11/14/2023   History of present illness Pt is a 78 y/o male presenting 11/05/23 with persistent right lower extremity pain in setting of limb ischemia. Underwent R common femoral endarterectomy, thrombectomy via femoral approach, popliteal cutdown w/ tibial thrombectomy and 4 compartment fasciotomies on 1/17. PMH: HTN, DM2, HLD, CKD   OT comments  Patient received in supine and agreeable to OT session. Patient required min assist to get to EOB due to requiring assistance with RLE. Patient required increased time before attempting transfer to recliner with CGA to stand and min assist for transfer due to pain. Once in recliner patient with increased pain asking to have feet elevated. Patient performed self care tasks seated in recliner due to pain. Patient will benefit from continued inpatient follow up therapy, <3 hours/day with potential for patient to progress to home with HHOT once pain is managed. Acute OT to continue to follow to address established goals to facilitate discharge to next venue of care.       If plan is discharge home, recommend the following:  A lot of help with walking and/or transfers;A lot of help with bathing/dressing/bathroom;Assistance with cooking/housework;Assist for transportation;Help with stairs or ramp for entrance   Equipment Recommendations  Other (comment);BSC/3in1;Wheelchair (measurements OT);Wheelchair cushion (measurements OT) (RW)    Recommendations for Other Services      Precautions / Restrictions Precautions Precautions: Fall;Other (comment) Precaution Comments: watch HR Restrictions Weight Bearing Restrictions Per Provider Order: No       Mobility Bed Mobility Overal bed mobility: Needs Assistance Bed Mobility: Supine to Sit     Supine to sit: Min assist, HOB elevated, Used rails     General bed mobility comments: Assistance  with RLE to get to EOB    Transfers Overall transfer level: Needs assistance Equipment used: Rolling walker (2 wheels) Transfers: Sit to/from Stand, Bed to chair/wheelchair/BSC Sit to Stand: Contact guard assist     Step pivot transfers: Min assist     General transfer comment: increased time with     Balance Overall balance assessment: Needs assistance Sitting-balance support: No upper extremity supported, Feet supported Sitting balance-Leahy Scale: Good Sitting balance - Comments: EOB   Standing balance support: Bilateral upper extremity supported, Reliant on assistive device for balance, During functional activity Standing balance-Leahy Scale: Poor Standing balance comment: Reliant on RW for support while attempting to keep weigth off RLE                           ADL either performed or assessed with clinical judgement   ADL Overall ADL's : Needs assistance/impaired     Grooming: Wash/dry hands;Wash/dry face;Set up;Sitting   Upper Body Bathing: Set up;Sitting       Upper Body Dressing : Set up;Sitting Upper Body Dressing Details (indicate cue type and reason): changed gown     Toilet Transfer: Minimal assistance;Rolling walker (2 wheels) Toilet Transfer Details (indicate cue type and reason): simulated to recliner           General ADL Comments: increased pain once in recliner and unable to perform standing ADL tasks    Extremity/Trunk Assessment              Vision       Perception     Praxis      Cognition Arousal: Alert Behavior During Therapy: Anxious Overall Cognitive Status: Within Functional Limits for  tasks assessed                                 General Comments: motivated to progress but limited by pain        Exercises      Shoulder Instructions       General Comments      Pertinent Vitals/ Pain       Pain Assessment Pain Assessment: Faces Faces Pain Scale: Hurts whole lot Pain Location: R  leg Pain Descriptors / Indicators: Discomfort, Grimacing, Guarding, Sharp, Moaning Pain Intervention(s): Limited activity within patient's tolerance, Monitored during session, Repositioned, Patient requesting pain meds-RN notified  Home Living                                          Prior Functioning/Environment              Frequency  Min 1X/week        Progress Toward Goals  OT Goals(current goals can now be found in the care plan section)  Progress towards OT goals: Progressing toward goals  Acute Rehab OT Goals Patient Stated Goal: more more OT Goal Formulation: With patient Time For Goal Achievement: 11/21/23 Potential to Achieve Goals: Good ADL Goals Pt Will Perform Lower Body Bathing: with supervision;sitting/lateral leans;sit to/from stand Pt Will Perform Lower Body Dressing: with supervision;sitting/lateral leans;sit to/from stand Pt Will Transfer to Toilet: with contact guard assist;ambulating  Plan      Co-evaluation                 AM-PAC OT "6 Clicks" Daily Activity     Outcome Measure   Help from another person eating meals?: None Help from another person taking care of personal grooming?: A Little Help from another person toileting, which includes using toliet, bedpan, or urinal?: A Lot Help from another person bathing (including washing, rinsing, drying)?: A Little Help from another person to put on and taking off regular upper body clothing?: A Little Help from another person to put on and taking off regular lower body clothing?: A Lot 6 Click Score: 17    End of Session Equipment Utilized During Treatment: Gait belt;Rolling walker (2 wheels)  OT Visit Diagnosis: Unsteadiness on feet (R26.81);Other abnormalities of gait and mobility (R26.89);Muscle weakness (generalized) (M62.81)   Activity Tolerance Patient tolerated treatment well;Patient limited by pain   Patient Left in chair;with call bell/phone within reach;with  chair alarm set;with family/visitor present   Nurse Communication Mobility status;Patient requests pain meds        Time: 0733-0803 OT Time Calculation (min): 30 min  Charges: OT General Charges $OT Visit: 1 Visit OT Treatments $Self Care/Home Management : 8-22 mins $Therapeutic Activity: 8-22 mins  Alfonse Flavors, OTA Acute Rehabilitation Services  Office [phone]   Dewain Penning 11/14/2023, 12:53 PM

## 2023-11-14 NOTE — Plan of Care (Signed)
  Problem: Coping: Goal: Ability to adjust to condition or change in health will improve Outcome: Progressing   Problem: Fluid Volume: Goal: Ability to maintain a balanced intake and output will improve Outcome: Progressing   Problem: Health Behavior/Discharge Planning: Goal: Ability to identify and utilize available resources and services will improve Outcome: Progressing Goal: Ability to manage health-related needs will improve Outcome: Progressing   Problem: Metabolic: Goal: Ability to maintain appropriate glucose levels will improve Outcome: Progressing   Problem: Tissue Perfusion: Goal: Adequacy of tissue perfusion will improve Outcome: Progressing   Problem: Activity: Goal: Risk for activity intolerance will decrease Outcome: Progressing   Problem: Pain Managment: Goal: General experience of comfort will improve and/or be controlled Outcome: Progressing

## 2023-11-14 NOTE — Progress Notes (Signed)
  Progress Note    11/14/2023 8:44 AM 4 Days Post-Op  Subjective:  c/o of R foot tenderness   Vitals:   11/14/23 0346 11/14/23 0819  BP: 130/68 127/79  Pulse: 93 91  Resp: 18 19  Temp: 99.4 F (37.4 C) 98.4 F (36.9 C)  SpO2: 100% 100%   Physical Exam: No acute distress HDS Nonlabored breathing R lower leg and groin incision c/d/I and without drainage, tenderness to R foot, Still with foot drop but plantar flexion intact although weak compared to L Multiphasic peroneal and DP signals on R  CBC    Component Value Date/Time   WBC 7.5 11/14/2023 0304   RBC 2.71 (L) 11/14/2023 0304   HGB 8.1 (L) 11/14/2023 0304   HCT 24.1 (L) 11/14/2023 0304   PLT 234 11/14/2023 0304   MCV 88.9 11/14/2023 0304   MCH 29.9 11/14/2023 0304   MCHC 33.6 11/14/2023 0304   RDW 14.2 11/14/2023 0304   LYMPHSABS 1.3 11/14/2023 0304   MONOABS 0.8 11/14/2023 0304   EOSABS 0.3 11/14/2023 0304   BASOSABS 0.0 11/14/2023 0304    BMET    Component Value Date/Time   NA 137 11/14/2023 0304   K 3.3 (L) 11/14/2023 0304   CL 109 11/14/2023 0304   CO2 20 (L) 11/14/2023 0304   GLUCOSE 128 (H) 11/14/2023 0304   BUN 8 11/14/2023 0304   CREATININE 1.12 11/14/2023 0304   CALCIUM 8.8 (L) 11/14/2023 0304   GFRNONAA >60 11/14/2023 0304    INR    Component Value Date/Time   INR 1.1 11/06/2023 0411     Intake/Output Summary (Last 24 hours) at 11/14/2023 0844 Last data filed at 11/14/2023 0700 Gross per 24 hour  Intake --  Output 700 ml  Net -700 ml     Assessment/Plan:  78 y.o. male is s/p  Right CFA endarterectomy with bovine patch angiopasty, RLE thrombectomy, cutdown popliteal with tibial thrombectomy of the AT, PT, peroneal arteries and 4 compartment fasciotomies 11/06/2023 by Dr. Chestine Spore and closure of right medial fasciotomy 11/10/2023 by Dr. Myra Gianotti Transitioned to eliquis.  Still having intermittent right foot pain like prior to his surgery RLE remains well perfused and warm with brisk  doppler Pero signal Continue ambulation and PT Will arrange follow up in 2-3 weeks for incision check staple removal   Manuel Pastures, PA-C [facility] [phone] 11/14/2023 8:44 AM

## 2023-11-14 NOTE — Progress Notes (Signed)
PROGRESS NOTE    Manuel Winters  ZOX:[id number] DOB: [birth year] DOA: 11/05/2023 PCP: Oletha Blend, MD   Brief Narrative:  This 78 yrs old Male with PMH significant for hypertension, diabetes type 2, hyperlipidemia, CKD 3, recent ED and outpatient visits for right leg pain, who presents due to persistent symptoms. Initially reports that his pain has been ongoing for about 1 month. He was chasing a truck and tripped and fell, reports about a week later developed pain from below the knee down to the foot which is intermittent and severe. He denies any numbness or weakness in the foot but does have deficits on exam. Right leg has been cool. Pain has been progressive especially in the past week, has been trying to ambulate with the use of crutches but in the past few days not able to get up much at all. Had no known history of PAD or other ASCVD prior, not taking antiplatelets or anticoagulants prior to this admission. No history of bleeding issues. He was admitted for further evaluation.   **Interim History Subsequently he underwent a right CFA endarterectomy with bovine patch angioplasty, right lower extremity thrombectomy abdominal popliteal and tibial thrombectomy of the AT, PT and peroneal arteries and 4 compartment fasciotomy with subsequent medial fasciotomy and closure performed on 11/09/2022.  *11/12/23: Blood Count dropped overnight so he will be typed and screend and transfused 1 unit of pRBC's. Having quite a bit of pain so will need to continue Analgesics. PT/OT Recommending Home Health.   On 11/13/2023 is doing much better.  Unfortunately spiked a slight temperature so we will obtain blood cultures, chest x-ray, urinalysis and obtain lactic acid level and procalcitonin level in the morning.  Today he had some pain but doing otherwise fairly well. CK continues to trend down.   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 PAD with bilateral SFA occlusions: -Patient has chronic PAD now presented with worsening right leg symptoms. -Etiology of this possibly atherothrombotic /embolic from underlying PAD v less likely cardioembolic with newly identified Aflutter.  RLE below the knee is cold with no pedal pulses on the Right, there is numbness in stocking distribution below the ankle on the R side, and strength is 3/5 with dorsiflexion at the ankle, no tissue loss.  -CTA with complete occlusion at R popliteal with no runoff to the foot, additional occlusions R SFA origin + prox SFA with reconstitution, and L CFA with reconstitution and 3 vessel runoff on the left.  -Vascular surgery Dr. Lenell Antu recommends for transfer to [facility] and initiation of heparin drip.  -Drip was continued and now being changed to DOAC at the direction of the vascular surgery team -Serial neurovascular checks of the right lower extremity -Adequate pain control. -Patient underwent Right CFA endarterectomy with bovine patch angiopasty, RLE thrombectomy, cutdown popliteal with tibial thrombectomy of the AT, PT, peroneal arteries and 4 compartment fasciotomies 11/06/2023 by Dr. Chestine Spore  -Continue Adequate Pain control.  -Subsequently patient underwent medial fasciotomy and closure performed 11/09/2022. -Pain control po Acetaminophen 1000 mg po q6hprn Mild Pain, IV Hydromorphone 0.5 mg q4hprn Breakthrough Pain; C/w Oxycodone IR 5 mg q4hprn Moderate Pain or 10 mg po q4hprn Severe Pain  -PT recommending Home Health and OT recommending SNF -Vascular evaluated and he continues to have brisk Right peroneal doppler signal -Continue to Monitor and continue to mobilize with therapy -Patient continues to have pain in the right lower extremity along with motor deficits  consistent with his initial presentation due to prolonged ischemia and PT OT recommends home health at this point   Lactic Acidosis:  -Continue with IV fluids.  -Lactic acid  normalized. Recent Labs  Lab 11/05/23 1952 11/05/23 2135 11/08/23 0259 11/14/23 0304  LATICACIDVEN 1.9 2.1* 1.1 0.8    Rhabdomyolysis:  -Elevated CKD levels. Continued with IV fluids with NS at 75 mL/hr -CK Level trend went from 5733 -> 12,526 -> 9430 -> 3896 -> 1646 -> 776 -> 459 -CK level continues to remain elevated , trend CK level.   Acute myocardial injury: -No history of chest pain.  High-sensitivity troponin 22 -> 26.   -EKG with a flutter with no acute ischemic changes. -Likely demand event in the setting of arrhythmia, management directed at underlying A-fib above.   Constipation -Initiated Bowel regimen 17 grams po BID, Senna-Docusate 1 tab po BID, and Bisacodyl 10 mg RC Daily PRN Moderate Constipation   Persistent Atrial Flutter -Patient presented with atrial flutter, started on IV diltiazem drip. -Echo showed LVEF 55 to 60%, TSH normal -Heart rate now controlled, Continue Lopressor 25 mg twice daily. -Cardizem discontinued. -CHADS2 score 5. Patient on IV heparin with plan to transition to DOAC before discharge when okay with vascular surgery. -If patient continued to remain in A-fib,  Consider outpatient DCCV after 3 weeks of uninterrupted AC. -Cardiology has signed off the case now but if necessary will reconsult   AKI on CKD stage IIIa Metabolic Acidosis -Improving with IV fluids and will reduced IVF to 75 mL/hr as CK is improving and will continue only for another 16 hours -Continue Holding chlorthalidone and losartan.  -BUN/Cr Trend: Recent Labs  Lab 11/07/23 0345 11/08/23 0259 11/10/23 0312 11/11/23 0304 11/12/23 0312 11/13/23 0326 11/14/23 0304  BUN 29* 23 38* 29* 14 11 8   CREATININE 1.50* 1.48* 1.81* 1.49* 1.19 1.17 1.12  -Has a Slight Metabolic Acidosis with a CO2 of 18, AG of 10, Chloride Level of 103 -Avoid Nephrotoxic Medications, Contrast Dyes, Hypotension and Dehydration to Ensure Adequate Renal Perfusion and will need to Renally Adjust  Meds -Continue to Monitor and Trend Renal Function carefully and repeat CMP in the AM   ABLA/Dilutional Drop -Hgb/Hct Trend: Recent Labs  Lab 11/08/23 0259 11/09/23 0334 11/10/23 0312 11/11/23 0304 11/12/23 0312 11/13/23 0326 11/14/23 0304  HGB 9.1* 8.2* 8.5* 7.6* 6.3* 8.4* 8.1*  HCT 26.9* 24.5* 25.4* 22.6* 19.6* 25.3* 24.1*  MCV 87.1 88.8 88.5 89.0 93.8 90.7 88.9  -Checked Anemia Panel and showed and Iron Level of 44, UBIC of 181, TIBC of 225, Saturation Ratio of 20, Ferritin Level of 251, Folate Level of 12.5, and 707 -Typed and Screend and Transfused 1 unit pRBCs given that Hgb dropped to 6.3 -Continue to Monitor for S/Sx of Bleeding; No overt bleeding ntoed -Repeat CBC in the AM   Fever, improved  -Unclear Etiology and spiked a Temp of 100.4 yesterday -Check Blood Cx x2 and showing NGTD at <24 hours -Checked CXR and showed no active disease  -Check U/A and unremarkable -Checked procalcitonin level and was <0.10 and lactic acid level was 0.8 -WBC Trend: Recent Labs  Lab 11/08/23 0259 11/09/23 0334 11/10/23 0312 11/11/23 0304 11/12/23 0312 11/13/23 0326 11/14/23 0304  WBC 8.6 8.6 8.1 7.8 7.2 9.0 7.5  -Continue to Monitor and Trend and repeat CBC in the AM -Hold Abx at this point but low threshold to initiate   Essential Hypertension: -BP parameters have improved.  -Continue Metoprolol Tartrate 25 mg twice daily. -  Continue to Monitor BP per Protocol -Last BP reading was 157/77   DM Type 2  -Resume SSI with very sensitive NovoLog sliding scale AC -HbA1c was 7.2 -Hold PO DM meds. -Continue monitor CBG trend: Recent Labs  Lab 11/13/23 0634 11/13/23 1244 11/13/23 1642 11/13/23 2150 11/14/23 0617 11/14/23 1229 11/14/23 1703  GLUCAP 142* 101* 120* 114* 121* 139* 110*   Hypokalemia -Patient's K+ Level Trend: Recent Labs  Lab 11/07/23 0345 11/08/23 0259 11/10/23 0312 11/11/23 0304 11/12/23 0312 11/13/23 0326 11/14/23 0304  K 4.1 3.6 3.9 3.7 3.7 4.1  3.3*  -Replete with po KCL 40 mEQ BID -Continue to Monitor and Replete as Necessary -Repeat CMP in the AM   Hypomagnesemia -Patient's Mag Level Trend: Recent Labs  Lab 11/06/23 0411 11/07/23 0345 11/10/23 0312 11/11/23 0304 11/12/23 0312 11/13/23 0326 11/14/23 0304  MG 1.8 1.7 1.8 1.6* 1.8 2.0 1.6*  -Replete with IV Mag Sulfate 2 grams again -Continue to Monitor and Replete as Necessary -Repeat Mag in the AM    Hypercalcemia, improved -Resolved with IV fluids.  -Calcium Level Trend: Recent Labs  Lab 11/07/23 0345 11/08/23 0259 11/10/23 0312 11/11/23 0304 11/12/23 0312 11/13/23 0326 11/14/23 0304  CALCIUM 9.1 9.5 9.4 8.9 8.2* 9.1 8.8*  -Continue to Monitor and Trend and repeat CMP in the AM   GERD/GI Prophylaxis -Continue with PPI with Pantoprazole 40 mg po daily  Hypoalbuminemia -Patient's Albumin Trend: Recent Labs  Lab 11/05/23 2135 11/07/23 0345 11/10/23 0312 11/12/23 0312 11/13/23 0326 11/14/23 0304  ALBUMIN 3.7 3.3* 2.8* 2.7* 2.9* 2.6*  -Continue to Monitor and Trend and repeat CMP in the AM   DVT prophylaxis:  apixaban (ELIQUIS) tablet 5 mg    Code Status: Full Code Family Communication: Discussed with family present at bedsi  Disposition Plan:  Level of care: Progressive Status is: Inpatient Remains inpatient appropriate because: Needs further clinical Improvement in CK and anticipating D/C in the next 24-48 hours   Consultants:  Vascular Surgery  Procedures:  As delineated as above  Antimicrobials:  Anti-infectives (From admission, onward)    Start     Dose/Rate Route Frequency Ordered Stop   11/10/23 0600  ceFAZolin (ANCEF) IVPB 1 g/50 mL premix       Note to Pharmacy: Send with pt to OR   1 g 100 mL/hr over 30 Minutes Intravenous On call 11/09/23 0802 11/10/23 1026   11/06/23 0600  penicillin g benzathine (BICILLIN LA) 1200000 UNIT/2ML injection 2.4 Million Units  Status:  Discontinued        2.4 Million Units Intramuscular  Once  11/06/23 0551 11/06/23 0551   11/06/23 0600  cefTRIAXone (ROCEPHIN) injection 500 mg  Status:  Discontinued        500 mg Intramuscular  Once 11/06/23 0551 11/06/23 0551       Subjective: Examined at bedside and was having some pain earlier but doing better now.  States is a 1 or 2 out of 10 now in severity.  No nausea or vomiting.  Denies lightheadedness or dizziness.  No other concerns or complaints this time.  Objective: Vitals:   11/14/23 0346 11/14/23 0819 11/14/23 1226 11/14/23 1701  BP: 130/68 127/79 (!) 102/59 (!) 157/77  Pulse: 93 91 89 84  Resp: 18 19 16  (!) 24  Temp: 99.4 F (37.4 C) 98.4 F (36.9 C) 98.7 F (37.1 C) 99.4 F (37.4 C)  TempSrc: Oral Oral Oral Oral  SpO2: 100% 100% 100% 97%  Weight:      Height:  Intake/Output Summary (Last 24 hours) at 11/14/2023 1800 Last data filed at 11/14/2023 1547 Gross per 24 hour  Intake 1491.3 ml  Output 500 ml  Net 991.3 ml   Filed Weights   11/05/23 1731 11/06/23 0915  Weight: 73.4 kg 72.6 kg   Examination: Physical Exam:  Constitutional: Thin chronically ill-appearing elderly African-American male who appears slightly uncomfortable complaining of some pain today Respiratory: Diminished to auscultation bilaterally, no wheezing, rales, rhonchi or crackles. Normal respiratory effort and patient is not tachypenic. No accessory muscle use.  Unlabored breathing Cardiovascular: RRR, no murmurs / rubs / gallops. S1 and S2 auscultated.  Has some trace extremity edema Abdomen: Soft, non-tender, non-distended. Bowel sounds positive.  GU: Deferred. Musculoskeletal: No clubbing / cyanosis of digits/nails. No joint deformity upper and lower extremities. Skin: Right leg incisions are stable as noted.  Clean dry intact Neurologic: CN 2-12 grossly intact with no focal deficits. Romberg sign and and cerebellar reflexes not assessed.  Psychiatric: Normal judgment and insight.  He is awake and alert   Data Reviewed: I have  personally reviewed following labs and imaging studies  CBC: Recent Labs  Lab 11/10/23 0312 11/11/23 0304 11/12/23 0312 11/13/23 0326 11/14/23 0304  WBC 8.1 7.8 7.2 9.0 7.5  NEUTROABS  --   --  4.8 5.8 5.2  HGB 8.5* 7.6* 6.3* 8.4* 8.1*  HCT 25.4* 22.6* 19.6* 25.3* 24.1*  MCV 88.5 89.0 93.8 90.7 88.9  PLT 195 203 199 237 234   Basic Metabolic Panel: Recent Labs  Lab 11/10/23 0312 11/11/23 0304 11/12/23 0312 11/13/23 0326 11/14/23 0304  NA 134* 136 131* 141 137  K 3.9 3.7 3.7 4.1 3.3*  CL 101 104 103 112* 109  CO2 22 22 18* 22 20*  GLUCOSE 146* 124* 129* 129* 128*  BUN 38* 29* 14 11 8   CREATININE 1.81* 1.49* 1.19 1.17 1.12  CALCIUM 9.4 8.9 8.2* 9.1 8.8*  MG 1.8 1.6* 1.8 2.0 1.6*  PHOS 4.0 2.7 2.3* 3.3 3.0   GFR: Estimated Creatinine Clearance: 52.6 mL/min (by C-G formula based on SCr of 1.12 mg/dL). Liver Function Tests: Recent Labs  Lab 11/10/23 0312 11/12/23 0312 11/13/23 0326 11/14/23 0304  AST 232* 100* 64* 42*  ALT 27 19 19 19   ALKPHOS 45 44 50 46  BILITOT 1.0 0.9 1.1 0.9  PROT 7.1 6.2* 6.7 6.3*  ALBUMIN 2.8* 2.7* 2.9* 2.6*   No results for input(s): "LIPASE", "AMYLASE" in the last 168 hours. No results for input(s): "AMMONIA" in the last 168 hours. Coagulation Profile: No results for input(s): "INR", "PROTIME" in the last 168 hours. Cardiac Enzymes: Recent Labs  Lab 11/08/23 0259 11/11/23 0304 11/12/23 0928 11/13/23 0326 11/14/23 0304  CKTOTAL 9,430* 3,896* 1,646* 776* 459*   BNP (last 3 results) No results for input(s): "PROBNP" in the last 8760 hours. HbA1C: No results for input(s): "HGBA1C" in the last 72 hours. CBG: Recent Labs  Lab 11/13/23 1642 11/13/23 2150 11/14/23 0617 11/14/23 1229 11/14/23 1703  GLUCAP 120* 114* 121* 139* 110*   Lipid Profile: No results for input(s): "CHOL", "HDL", "LDLCALC", "TRIG", "CHOLHDL", "LDLDIRECT" in the last 72 hours. Thyroid Function Tests: No results for input(s): "TSH", "T4TOTAL",  "FREET4", "T3FREE", "THYROIDAB" in the last 72 hours. Anemia Panel: Recent Labs    11/12/23 0312  VITAMINB12 707  FOLATE 12.5  FERRITIN 251  TIBC 225*  IRON 44*  RETICCTPCT 3.9*   Sepsis Labs: Recent Labs  Lab 11/08/23 0259 11/14/23 0304  PROCALCITON  --  <0.10  LATICACIDVEN 1.1 0.8   Recent Results (from the past 240 hours)  Surgical pcr screen     Status: Abnormal   Collection Time: 11/06/23  8:44 AM   Specimen: Nasal Mucosa; Nasal Swab  Result Value Ref Range Status   MRSA, PCR NEGATIVE NEGATIVE Final   Staphylococcus aureus POSITIVE (A) NEGATIVE Final    Comment: (NOTE) The Xpert SA Assay (FDA approved for NASAL specimens in patients 108 years of age and older), is one component of a comprehensive surveillance program. It is not intended to diagnose infection nor to guide or monitor treatment. Performed at [facility], [address], [place], [place] [address]   Culture, blood (Routine X 2) w Reflex to ID Panel     Status: None (Preliminary result)   Collection Time: 11/13/23  2:44 PM   Specimen: BLOOD LEFT HAND  Result Value Ref Range Status   Specimen Description BLOOD LEFT HAND  Final   Special Requests   Final    BOTTLES DRAWN AEROBIC AND ANAEROBIC Blood Culture results may not be optimal due to an inadequate volume of blood received in culture bottles   Culture   Final    NO GROWTH < 24 HOURS Performed at [facility], [address], [place], [place] [address]    Report Status PENDING  Incomplete  Culture, blood (Routine X 2) w Reflex to ID Panel     Status: None (Preliminary result)   Collection Time: 11/13/23  6:48 PM   Specimen: BLOOD  Result Value Ref Range Status   Specimen Description BLOOD LEFT ANTECUBITAL  Final   Special Requests   Final    BOTTLES DRAWN AEROBIC AND ANAEROBIC Blood Culture results may not be optimal due to an inadequate volume of blood received in culture bottles   Culture   Final    NO GROWTH < 24  HOURS Performed at [facility], [address], [place], [place] [address]    Report Status PENDING  Incomplete    Radiology Studies: DG CHEST PORT 1 VIEW Result Date: 11/13/2023 CLINICAL DATA:  Fever. EXAM: PORTABLE CHEST 1 VIEW COMPARISON:  11/05/2023 FINDINGS: Stable heart size and mediastinal contours. Aortic atherosclerosis. No focal airspace disease. No pulmonary edema, pleural effusion or pneumothorax. No acute osseous findings. IMPRESSION: No acute chest findings. Electronically Signed   By: Narda Rutherford M.D.   On: 11/13/2023 19:58   Scheduled Meds:  apixaban  5 mg Oral BID   gabapentin  100 mg Oral BID   insulin aspart  0-6 Units Subcutaneous TID WC   latanoprost  1 drop Both Eyes QHS   metoprolol tartrate  25 mg Oral BID   pantoprazole  40 mg Oral Daily   polyethylene glycol  17 g Oral BID   senna-docusate  1 tablet Oral BID   sodium chloride flush  3 mL Intravenous Q12H   Continuous Infusions:  sodium chloride      LOS: 9 days   Marguerita Merles, DO [facility] Available via Epic secure chat 7am-7pm After these hours, please refer to coverage provider listed on [email or web address] 11/14/2023, 6:00 PM

## 2023-11-15 DIAGNOSIS — I70209 Unspecified atherosclerosis of native arteries of extremities, unspecified extremity: Secondary | ICD-10-CM | POA: Diagnosis not present

## 2023-11-15 DIAGNOSIS — E8809 Other disorders of plasma-protein metabolism, not elsewhere classified: Secondary | ICD-10-CM | POA: Diagnosis not present

## 2023-11-15 LAB — COMPREHENSIVE METABOLIC PANEL
ALT: 19 U/L (ref 0–44)
AST: 45 U/L — ABNORMAL HIGH (ref 15–41)
Albumin: 2.5 g/dL — ABNORMAL LOW (ref 3.5–5.0)
Alkaline Phosphatase: 54 U/L (ref 38–126)
Anion gap: 5 (ref 5–15)
BUN: 6 mg/dL — ABNORMAL LOW (ref 8–23)
CO2: 22 mmol/L (ref 22–32)
Calcium: 8.9 mg/dL (ref 8.9–10.3)
Chloride: 111 mmol/L (ref 98–111)
Creatinine, Ser: 1.27 mg/dL — ABNORMAL HIGH (ref 0.61–1.24)
GFR, Estimated: 58 mL/min — ABNORMAL LOW (ref >60)
Glucose, Bld: 143 mg/dL — ABNORMAL HIGH (ref 70–99)
Potassium: 3.8 mmol/L (ref 3.5–5.1)
Sodium: 138 mmol/L (ref 135–145)
Total Bilirubin: 1 mg/dL (ref 0.0–1.2)
Total Protein: 6.4 g/dL — ABNORMAL LOW (ref 6.5–8.1)

## 2023-11-15 LAB — CBC WITH DIFFERENTIAL/PLATELET
Abs Immature Granulocytes: 0.04 10*3/uL (ref 0.00–0.07)
Basophils Absolute: 0 10*3/uL (ref 0.0–0.1)
Basophils Relative: 0 %
Eosinophils Absolute: 0.2 10*3/uL (ref 0.0–0.5)
Eosinophils Relative: 3 %
HCT: 23.6 % — ABNORMAL LOW (ref 39.0–52.0)
Hemoglobin: 7.9 g/dL — ABNORMAL LOW (ref 13.0–17.0)
Immature Granulocytes: 1 %
Lymphocytes Relative: 19 %
Lymphs Abs: 1.3 10*3/uL (ref 0.7–4.0)
MCH: 30 pg (ref 26.0–34.0)
MCHC: 33.5 g/dL (ref 30.0–36.0)
MCV: 89.7 fL (ref 80.0–100.0)
Monocytes Absolute: 0.6 10*3/uL (ref 0.1–1.0)
Monocytes Relative: 9 %
Neutro Abs: 4.6 10*3/uL (ref 1.7–7.7)
Neutrophils Relative %: 68 %
Platelets: 247 10*3/uL (ref 150–400)
RBC: 2.63 MIL/uL — ABNORMAL LOW (ref 4.22–5.81)
RDW: 14.4 % (ref 11.5–15.5)
WBC: 6.8 10*3/uL (ref 4.0–10.5)
nRBC: 0 % (ref 0.0–0.2)

## 2023-11-15 LAB — MAGNESIUM: Magnesium: 1.8 mg/dL (ref 1.7–2.4)

## 2023-11-15 LAB — GLUCOSE, CAPILLARY
Glucose-Capillary: 115 mg/dL — ABNORMAL HIGH (ref 70–99)
Glucose-Capillary: 135 mg/dL — ABNORMAL HIGH (ref 70–99)
Glucose-Capillary: 109 mg/dL — ABNORMAL HIGH (ref 70–99)
Glucose-Capillary: 120 mg/dL — ABNORMAL HIGH (ref 70–99)

## 2023-11-15 LAB — CK: Total CK: 477 U/L — ABNORMAL HIGH (ref 49–397)

## 2023-11-15 LAB — PHOSPHORUS: Phosphorus: 2.9 mg/dL (ref 2.5–4.6)

## 2023-11-15 MED ORDER — MAGNESIUM SULFATE 2 GM/50ML IV SOLN
2.0000 g | Freq: Once | INTRAVENOUS | Status: AC
  Administered 2023-11-15: 2 g via INTRAVENOUS
  Filled 2023-11-15: qty 50

## 2023-11-15 MED ORDER — HYDROMORPHONE HCL 1 MG/ML IJ SOLN
1.0000 mg | INTRAMUSCULAR | Status: DC | PRN
  Administered 2023-11-16 (×3): 1 mg via INTRAVENOUS
  Filled 2023-11-15 (×3): qty 1

## 2023-11-15 NOTE — Progress Notes (Signed)
PROGRESS NOTE    Manuel Winters  GEX:[id number] DOB: [birth year] DOA: 11/05/2023 PCP: Oletha Blend, MD   Brief Narrative:  This 78 yrs old Male with PMH significant for hypertension, diabetes type 2, hyperlipidemia, CKD 3, recent ED and outpatient visits for right leg pain, who presents due to persistent symptoms. Initially reports that his pain has been ongoing for about 1 month. He was chasing a truck and tripped and fell, reports about a week later developed pain from below the knee down to the foot which is intermittent and severe. He denies any numbness or weakness in the foot but does have deficits on exam. Right leg has been cool. Pain has been progressive especially in the past week, has been trying to ambulate with the use of crutches but in the past few days not able to get up much at all. Had no known history of PAD or other ASCVD prior, not taking antiplatelets or anticoagulants prior to this admission. No history of bleeding issues. He was admitted for further evaluation.   **Interim History Subsequently he underwent a right CFA endarterectomy with bovine patch angioplasty, right lower extremity thrombectomy abdominal popliteal and tibial thrombectomy of the AT, PT and peroneal arteries and 4 compartment fasciotomy with subsequent medial fasciotomy and closure performed on 11/09/2022.  *11/12/23: Blood Count dropped overnight so he will be typed and screend and transfused 1 unit of pRBC's. Having quite a bit of pain so will need to continue Analgesics. PT/OT Recommending Home Health.   On 11/13/2023 is doing much better.  Unfortunately spiked a slight temperature so we will obtain blood cultures, chest x-ray, urinalysis and obtain lactic acid level and procalcitonin level in the morning.  Yesterday he had some pain but doing otherwise fairly well. CK continues to trend down and likely plateau' ed. Today he is doing ok but still having Pain. Anticipating D/C in the next 24 hours now that he  has been afebrile for 2 days and because CK is nearly normalized.   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 PAD with bilateral SFA occlusions: -Patient has chronic PAD now presented with worsening right leg symptoms. -Etiology of this possibly atherothrombotic /embolic from underlying PAD v less likely cardioembolic with newly identified Aflutter.  RLE below the knee is cold with no pedal pulses on the Right, there is numbness in stocking distribution below the ankle on the R side, and strength is 3/5 with dorsiflexion at the ankle, no tissue loss.  -CTA with complete occlusion at R popliteal with no runoff to the foot, additional occlusions R SFA origin + prox SFA with reconstitution, and L CFA with reconstitution and 3 vessel runoff on the left.  -Vascular surgery Dr. Lenell Antu recommends for transfer to [facility] and initiation of heparin drip.  -Drip was continued and now being changed to DOAC at the direction of the vascular surgery team -Serial neurovascular checks of the right lower extremity -Adequate pain control. -Patient underwent Right CFA endarterectomy with bovine patch angiopasty, RLE thrombectomy, cutdown popliteal with tibial thrombectomy of the AT, PT, peroneal arteries and 4 compartment fasciotomies 11/06/2023 by Dr. Chestine Spore  -Continue Adequate Pain control.  -Subsequently patient underwent medial fasciotomy and closure performed 11/09/2022. -Pain control po Acetaminophen 1000 mg po q6hprn Mild Pain, IV Hydromorphone 0.5 mg q4hprn Breakthrough Pain; C/w Oxycodone IR 5 mg q4hprn Moderate Pain or 10 mg po q4hprn Severe Pain  -PT recommending Home Health and OT recommending SNF -  Vascular evaluated and he continues to have brisk Right peroneal doppler signal -Continue to Monitor and continue to mobilize with therapy -Patient continues to have pain in the right lower extremity along with motor deficits consistent  with his initial presentation due to prolonged ischemia and PT OT recommends home health at this point and anticipating D/C in the next 24 hours   Lactic Acidosis:  -Continue with IV fluids.  -Lactic acid normalized. Recent Labs  Lab 11/05/23 1952 11/05/23 2135 11/08/23 0259 11/14/23 0304  LATICACIDVEN 1.9 2.1* 1.1 0.8    Rhabdomyolysis:  -Elevated CKD levels. Continued with IV fluids with NS at 75 mL/hr -CK Level trend went from 5733 -> 12,526 -> 9430 -> 3896 -> 1646 -> 776 -> 459 -> 477 -CK level continues to remain elevated but almost normalized, trend CK level.   Acute myocardial injury: -No history of chest pain.  High-sensitivity troponin 22 -> 26.   -EKG with a flutter with no acute ischemic changes. -Likely demand event in the setting of arrhythmia, management directed at underlying A-fib above.   Constipation -Initiated Bowel regimen 17 grams po BID, Senna-Docusate 1 tab po BID, and Bisacodyl 10 mg RC Daily PRN Moderate Constipation   Persistent Atrial Flutter -Patient presented with atrial flutter, started on IV diltiazem drip. -Echo showed LVEF 55 to 60%, TSH normal -Heart rate now controlled, Continue Lopressor 25 mg twice daily. -Cardizem discontinued. -CHADS2 score 5. Patient on IV heparin with plan to transition to DOAC before discharge when okay with vascular surgery. -If patient continued to remain in A-fib,  Consider outpatient DCCV after 3 weeks of uninterrupted AC. -Cardiology has signed off the case now but if necessary will reconsult if necessary    AKI on CKD stage IIIa Metabolic Acidosis -IVF now stopped -Continue Holding chlorthalidone and losartan.  -BUN/Cr Trend: Recent Labs  Lab 11/08/23 0259 11/10/23 0312 11/11/23 0304 11/12/23 0312 11/13/23 0326 11/14/23 0304 11/15/23 0253  BUN 23 38* 29* 14 11 8  6*  CREATININE 1.48* 1.81* 1.49* 1.19 1.17 1.12 1.27*  -Had a Slight Metabolic Acidosis but now improved and CO2 is 22, Chloride Level of 111,  and AG of 5 -Avoid Nephrotoxic Medications, Contrast Dyes, Hypotension and Dehydration to Ensure Adequate Renal Perfusion and will need to Renally Adjust Meds -Continue to Monitor and Trend Renal Function carefully and repeat CMP in the AM   ABLA/Dilutional Drop -Hgb/Hct Trend: Recent Labs  Lab 11/09/23 0334 11/10/23 0312 11/11/23 0304 11/12/23 0312 11/13/23 0326 11/14/23 0304 11/15/23 0253  HGB 8.2* 8.5* 7.6* 6.3* 8.4* 8.1* 7.9*  HCT 24.5* 25.4* 22.6* 19.6* 25.3* 24.1* 23.6*  MCV 88.8 88.5 89.0 93.8 90.7 88.9 89.7  -Checked Anemia Panel and showed and Iron Level of 44, UBIC of 181, TIBC of 225, Saturation Ratio of 20, Ferritin Level of 251, Folate Level of 12.5, and 707 -Typed and Screend and Transfused 1 unit pRBCs given that Hgb dropped to 6.3 -Continue to Monitor for S/Sx of Bleeding; No overt bleeding ntoed -Repeat CBC in the AM   Fever, improved  -Unclear Etiology and spiked a Temp of 100.4 the day before yesterday -Check Blood Cx x2 and showing NGTD at 2 Days -Checked CXR and showed no active disease  -Check U/A and unremarkable -Checked procalcitonin level and was <0.10 and lactic acid level was 0.8 -WBC Trend: Recent Labs  Lab 11/09/23 0334 11/10/23 0312 11/11/23 0304 11/12/23 0312 11/13/23 0326 11/14/23 0304 11/15/23 0253  WBC 8.6 8.1 7.8 7.2 9.0 7.5 6.8  -Continue  to Monitor and Trend and repeat CBC in the AM -Hold Abx at this point but low threshold to initiate; Fever resolved  Essential Hypertension: -BP parameters have improved.  -Continue Metoprolol Tartrate 25 mg twice daily. -Continue to Monitor BP per Protocol -Last BP reading was 120/71   DM Type 2  -Resume SSI with very sensitive NovoLog sliding scale AC -HbA1c was 7.2 -Hold PO DM meds. -Continue monitor CBG trend: Recent Labs  Lab 11/13/23 2150 11/14/23 0617 11/14/23 1229 11/14/23 1703 11/14/23 2115 11/15/23 0615 11/15/23 1221  GLUCAP 114* 121* 139* 110* 100* 115* 135*    Hypokalemia -Patient's K+ Level Trend: Recent Labs  Lab 11/08/23 0259 11/10/23 0312 11/11/23 0304 11/12/23 0312 11/13/23 0326 11/14/23 0304 11/15/23 0253  K 3.6 3.9 3.7 3.7 4.1 3.3* 3.8  -Replete with po KCL 40 mEQ BID yesterday  -Continue to Monitor and Replete as Necessary -Repeat CMP in the AM   Hypomagnesemia -Patient's Mag Level Trend: Recent Labs  Lab 11/07/23 0345 11/10/23 0312 11/11/23 0304 11/12/23 0312 11/13/23 0326 11/14/23 0304 11/15/23 0253  MG 1.7 1.8 1.6* 1.8 2.0 1.6* 1.8  -Replete with IV Mag Sulfate 2 grams again -Continue to Monitor and Replete as Necessary -Repeat Mag in the AM    Hypercalcemia, improved -Resolved with IV fluids and then was mildly hypocalcemic.  -Calcium Level Trend: Recent Labs  Lab 11/08/23 0259 11/10/23 0312 11/11/23 0304 11/12/23 0312 11/13/23 0326 11/14/23 0304 11/15/23 0253  CALCIUM 9.5 9.4 8.9 8.2* 9.1 8.8* 8.9  -Continue to Monitor and Trend and repeat CMP in the AM   GERD/GI Prophylaxis -Continue with PPI with Pantoprazole 40 mg po daily  Hypoalbuminemia -Patient's Albumin Trend: Recent Labs  Lab 11/05/23 2135 11/07/23 0345 11/10/23 0312 11/12/23 0312 11/13/23 0326 11/14/23 0304 11/15/23 0253  ALBUMIN 3.7 3.3* 2.8* 2.7* 2.9* 2.6* 2.5*  -Continue to Monitor and Trend and repeat CMP in the AM   DVT prophylaxis:  apixaban (ELIQUIS) tablet 5 mg    Code Status: Full Code Family Communication: Discussed with family present at bedside  Disposition Plan:  Level of care: Progressive Status is: Inpatient Remains inpatient appropriate because: Needs further clinical Improvement in CK and anticipating D/C in the next 24 hours    Consultants:  Vascular Surgery   Procedures:  As delineated as above  Antimicrobials:  Anti-infectives (From admission, onward)    Start     Dose/Rate Route Frequency Ordered Stop   11/10/23 0600  ceFAZolin (ANCEF) IVPB 1 g/50 mL premix       Note to Pharmacy: Send with  pt to OR   1 g 100 mL/hr over 30 Minutes Intravenous On call 11/09/23 0802 11/10/23 1026   11/06/23 0600  penicillin g benzathine (BICILLIN LA) 1200000 UNIT/2ML injection 2.4 Million Units  Status:  Discontinued        2.4 Million Units Intramuscular  Once 11/06/23 0551 11/06/23 0551   11/06/23 0600  cefTRIAXone (ROCEPHIN) injection 500 mg  Status:  Discontinued        500 mg Intramuscular  Once 11/06/23 0551 11/06/23 0551       Subjective: Seen and examined at bedside and was having some pain again.  No nausea or vomiting.  Feels otherwise well besides the pain in his foot.  No other concerns or complaints at this time.  Objective: Vitals:   11/15/23 0834 11/15/23 0912 11/15/23 1143 11/15/23 1510  BP:  (!) 124/58 112/68 120/71  Pulse: 80 88 (!) 59 95  Resp: (!) 23  (!)  24 (!) 22  Temp:   98.4 F (36.9 C) 98.2 F (36.8 C)  TempSrc:   Oral Oral  SpO2: 98%  99% 92%  Weight:      Height:        Intake/Output Summary (Last 24 hours) at 11/15/2023 1610 Last data filed at 11/15/2023 1505 Gross per 24 hour  Intake 982.35 ml  Output --  Net 982.35 ml   Filed Weights   11/05/23 1731 11/06/23 0915  Weight: 73.4 kg 72.6 kg   Examination: Physical Exam:  Constitutional: Thin chronically ill-appearing elderly African-American male in no acute distress appears slightly uncomfortable Respiratory: Diminished to auscultation bilaterally, no wheezing, rales, rhonchi or crackles. Normal respiratory effort and patient is not tachypenic. No accessory muscle use.  Unlabored breathing Cardiovascular: RRR, no murmurs / rubs / gallops. S1 and S2 auscultated. No extremity edema Abdomen: Soft, non-tender, non-distended. Bowel sounds positive.  GU: Deferred. Musculoskeletal: No clubbing / cyanosis of digits/nails. No joint deformity upper and lower extremities.  Skin: Right leg incisions with staples noted appear C/D/I. No induration; Warm and dry.  Neurologic: CN 2-12 grossly intact with no focal  deficits.  Romberg sign and cerebellar reflexes not assessed.  Psychiatric: Normal judgment and insight. Alert and oriented x 3. Normal mood and appropriate affect.   Data Reviewed: I have personally reviewed following labs and imaging studies  CBC: Recent Labs  Lab 11/11/23 0304 11/12/23 0312 11/13/23 0326 11/14/23 0304 11/15/23 0253  WBC 7.8 7.2 9.0 7.5 6.8  NEUTROABS  --  4.8 5.8 5.2 4.6  HGB 7.6* 6.3* 8.4* 8.1* 7.9*  HCT 22.6* 19.6* 25.3* 24.1* 23.6*  MCV 89.0 93.8 90.7 88.9 89.7  PLT 203 199 237 234 247   Basic Metabolic Panel: Recent Labs  Lab 11/11/23 0304 11/12/23 0312 11/13/23 0326 11/14/23 0304 11/15/23 0253  NA 136 131* 141 137 138  K 3.7 3.7 4.1 3.3* 3.8  CL 104 103 112* 109 111  CO2 22 18* 22 20* 22  GLUCOSE 124* 129* 129* 128* 143*  BUN 29* 14 11 8  6*  CREATININE 1.49* 1.19 1.17 1.12 1.27*  CALCIUM 8.9 8.2* 9.1 8.8* 8.9  MG 1.6* 1.8 2.0 1.6* 1.8  PHOS 2.7 2.3* 3.3 3.0 2.9   GFR: Estimated Creatinine Clearance: 46.4 mL/min (A) (by C-G formula based on SCr of 1.27 mg/dL (H)). Liver Function Tests: Recent Labs  Lab 11/10/23 0312 11/12/23 0312 11/13/23 0326 11/14/23 0304 11/15/23 0253  AST 232* 100* 64* 42* 45*  ALT 27 19 19 19 19   ALKPHOS 45 44 50 46 54  BILITOT 1.0 0.9 1.1 0.9 1.0  PROT 7.1 6.2* 6.7 6.3* 6.4*  ALBUMIN 2.8* 2.7* 2.9* 2.6* 2.5*   No results for input(s): "LIPASE", "AMYLASE" in the last 168 hours. No results for input(s): "AMMONIA" in the last 168 hours. Coagulation Profile: No results for input(s): "INR", "PROTIME" in the last 168 hours. Cardiac Enzymes: Recent Labs  Lab 11/11/23 0304 11/12/23 0928 11/13/23 0326 11/14/23 0304 11/15/23 0253  CKTOTAL 3,896* 1,646* 776* 459* 477*   BNP (last 3 results) No results for input(s): "PROBNP" in the last 8760 hours. HbA1C: No results for input(s): "HGBA1C" in the last 72 hours. CBG: Recent Labs  Lab 11/14/23 1229 11/14/23 1703 11/14/23 2115 11/15/23 0615 11/15/23 1221   GLUCAP 139* 110* 100* 115* 135*   Lipid Profile: No results for input(s): "CHOL", "HDL", "LDLCALC", "TRIG", "CHOLHDL", "LDLDIRECT" in the last 72 hours. Thyroid Function Tests: No results for input(s): "TSH", "T4TOTAL", "FREET4", "  T3FREE", "THYROIDAB" in the last 72 hours. Anemia Panel: No results for input(s): "VITAMINB12", "FOLATE", "FERRITIN", "TIBC", "IRON", "RETICCTPCT" in the last 72 hours. Sepsis Labs: Recent Labs  Lab 11/14/23 0304  PROCALCITON <0.10  LATICACIDVEN 0.8   Recent Results (from the past 240 hours)  Surgical pcr screen     Status: Abnormal   Collection Time: 11/06/23  8:44 AM   Specimen: Nasal Mucosa; Nasal Swab  Result Value Ref Range Status   MRSA, PCR NEGATIVE NEGATIVE Final   Staphylococcus aureus POSITIVE (A) NEGATIVE Final    Comment: (NOTE) The Xpert SA Assay (FDA approved for NASAL specimens in patients 45 years of age and older), is one component of a comprehensive surveillance program. It is not intended to diagnose infection nor to guide or monitor treatment. Performed at [facility], [address] [place], [place] [address]   Culture, blood (Routine X 2) w Reflex to ID Panel     Status: None (Preliminary result)   Collection Time: 11/13/23  2:44 PM   Specimen: BLOOD LEFT HAND  Result Value Ref Range Status   Specimen Description BLOOD LEFT HAND  Final   Special Requests   Final    BOTTLES DRAWN AEROBIC AND ANAEROBIC Blood Culture results may not be optimal due to an inadequate volume of blood received in culture bottles   Culture   Final    NO GROWTH 2 DAYS Performed at [facility], [address], [place], [place] [address]    Report Status PENDING  Incomplete  Culture, blood (Routine X 2) w Reflex to ID Panel     Status: None (Preliminary result)   Collection Time: 11/13/23  6:48 PM   Specimen: BLOOD  Result Value Ref Range Status   Specimen Description BLOOD LEFT ANTECUBITAL  Final   Special Requests   Final     BOTTLES DRAWN AEROBIC AND ANAEROBIC Blood Culture results may not be optimal due to an inadequate volume of blood received in culture bottles   Culture   Final    NO GROWTH 2 DAYS Performed at [facility], [address], [place], [place] [address]    Report Status PENDING  Incomplete    Radiology Studies: DG CHEST PORT 1 VIEW Result Date: 11/13/2023 CLINICAL DATA:  Fever. EXAM: PORTABLE CHEST 1 VIEW COMPARISON:  11/05/2023 FINDINGS: Stable heart size and mediastinal contours. Aortic atherosclerosis. No focal airspace disease. No pulmonary edema, pleural effusion or pneumothorax. No acute osseous findings. IMPRESSION: No acute chest findings. Electronically Signed   By: Narda Rutherford M.D.   On: 11/13/2023 19:58   Scheduled Meds:  apixaban  5 mg Oral BID   gabapentin  100 mg Oral BID   insulin aspart  0-6 Units Subcutaneous TID WC   latanoprost  1 drop Both Eyes QHS   metoprolol tartrate  25 mg Oral BID   pantoprazole  40 mg Oral Daily   polyethylene glycol  17 g Oral BID   senna-docusate  1 tablet Oral BID   sodium chloride flush  3 mL Intravenous Q12H   Continuous Infusions:   LOS: 10 days   Marguerita Merles, DO [facility] Available via Epic secure chat 7am-7pm After these hours, please refer to coverage provider listed on [email or web address] 11/15/2023, 4:10 PM

## 2023-11-15 NOTE — Progress Notes (Addendum)
  Progress Note    11/15/2023 9:26 AM 5 Days Post-Op  Subjective:  R foot pain   Vitals:   11/15/23 0834 11/15/23 0912  BP:  (!) 124/58  Pulse: 80 88  Resp: (!) 23   Temp:    SpO2: 98%    Physical Exam: Lungs:  non labored Incisions:  R groin and lower leg incisions c/d/i Extremities:  brisk peroneal by doppler; foot warm to touch; pain with light touch Neurologic: A&O  CBC    Component Value Date/Time   WBC 6.8 11/15/2023 0253   RBC 2.63 (L) 11/15/2023 0253   HGB 7.9 (L) 11/15/2023 0253   HCT 23.6 (L) 11/15/2023 0253   PLT 247 11/15/2023 0253   MCV 89.7 11/15/2023 0253   MCH 30.0 11/15/2023 0253   MCHC 33.5 11/15/2023 0253   RDW 14.4 11/15/2023 0253   LYMPHSABS 1.3 11/15/2023 0253   MONOABS 0.6 11/15/2023 0253   EOSABS 0.2 11/15/2023 0253   BASOSABS 0.0 11/15/2023 0253    BMET    Component Value Date/Time   NA 138 11/15/2023 0253   K 3.8 11/15/2023 0253   CL 111 11/15/2023 0253   CO2 22 11/15/2023 0253   GLUCOSE 143 (H) 11/15/2023 0253   BUN 6 (L) 11/15/2023 0253   CREATININE 1.27 (H) 11/15/2023 0253   CALCIUM 8.9 11/15/2023 0253   GFRNONAA 58 (L) 11/15/2023 0253    INR    Component Value Date/Time   INR 1.1 11/06/2023 0411     Intake/Output Summary (Last 24 hours) at 11/15/2023 0926 Last data filed at 11/15/2023 1610 Gross per 24 hour  Intake 2423.65 ml  Output --  Net 2423.65 ml     Assessment/Plan:  78 y.o. male is s/p  Right CFA endarterectomy with bovine patch angiopasty, RLE thrombectomy, cutdown popliteal with tibial thrombectomy of the AT, PT, peroneal arteries and 4 compartment fasciotomies 11/06/2023 by Dr. Chestine Spore and closure of right medial fasciotomy 11/10/2023 by Dr. Myra Gianotti Transitioned to eliquis. 5 Days Post-Op   R foot continues to be painful to light touch however foot is well perfused by doppler exam Incisions are healing well Continue Eliquis Ok for discharge from vascular standpoint when [facility] arranged; office will arrange  staple removal in a few weeks   Emilie Rutter, PA-C [facility] [phone] 11/15/2023 9:26 AM  VASCULAR STAFF ADDENDUM: I have independently interviewed and examined the patient. I agree with the above.   Daria Pastures MD [facility] Phone Number: [phone] 11/15/2023 9:42 AM

## 2023-11-15 NOTE — Progress Notes (Signed)
TR[facility] night cross cover note:   I was notified by RN of the patient's report of pain control and existing Dilaudid 0.5 mg IV every 4 hours as needed.  I subsequently increased the dose of his Dilaudid to 1 mg IV q4h prn.     Manuel Pigg, DO Hospitalist

## 2023-11-16 ENCOUNTER — Other Ambulatory Visit (HOSPITAL_COMMUNITY): Payer: Self-pay

## 2023-11-16 ENCOUNTER — Telehealth (HOSPITAL_COMMUNITY): Payer: Self-pay | Admitting: Pharmacy Technician

## 2023-11-16 DIAGNOSIS — I70209 Unspecified atherosclerosis of native arteries of extremities, unspecified extremity: Secondary | ICD-10-CM | POA: Diagnosis not present

## 2023-11-16 DIAGNOSIS — E8809 Other disorders of plasma-protein metabolism, not elsewhere classified: Secondary | ICD-10-CM | POA: Diagnosis not present

## 2023-11-16 LAB — COMPREHENSIVE METABOLIC PANEL
ALT: 18 U/L (ref 0–44)
AST: 34 U/L (ref 15–41)
Albumin: 2.6 g/dL — ABNORMAL LOW (ref 3.5–5.0)
Alkaline Phosphatase: 58 U/L (ref 38–126)
Anion gap: 9 (ref 5–15)
BUN: 6 mg/dL — ABNORMAL LOW (ref 8–23)
CO2: 19 mmol/L — ABNORMAL LOW (ref 22–32)
Calcium: 8.7 mg/dL — ABNORMAL LOW (ref 8.9–10.3)
Chloride: 106 mmol/L (ref 98–111)
Creatinine, Ser: 1.15 mg/dL (ref 0.61–1.24)
GFR, Estimated: >60 mL/min (ref >60)
Glucose, Bld: 110 mg/dL — ABNORMAL HIGH (ref 70–99)
Potassium: 3.6 mmol/L (ref 3.5–5.1)
Sodium: 134 mmol/L — ABNORMAL LOW (ref 135–145)
Total Bilirubin: 1.1 mg/dL (ref 0.0–1.2)
Total Protein: 6.7 g/dL (ref 6.5–8.1)

## 2023-11-16 LAB — CBC WITH DIFFERENTIAL/PLATELET
Abs Immature Granulocytes: 0.03 10*3/uL (ref 0.00–0.07)
Basophils Absolute: 0 10*3/uL (ref 0.0–0.1)
Basophils Relative: 0 %
Eosinophils Absolute: 0.2 10*3/uL (ref 0.0–0.5)
Eosinophils Relative: 3 %
HCT: 24.6 % — ABNORMAL LOW (ref 39.0–52.0)
Hemoglobin: 8 g/dL — ABNORMAL LOW (ref 13.0–17.0)
Immature Granulocytes: 0 %
Lymphocytes Relative: 19 %
Lymphs Abs: 1.5 10*3/uL (ref 0.7–4.0)
MCH: 29.9 pg (ref 26.0–34.0)
MCHC: 32.5 g/dL (ref 30.0–36.0)
MCV: 91.8 fL (ref 80.0–100.0)
Monocytes Absolute: 0.9 10*3/uL (ref 0.1–1.0)
Monocytes Relative: 11 %
Neutro Abs: 5.4 10*3/uL (ref 1.7–7.7)
Neutrophils Relative %: 67 %
Platelets: 274 10*3/uL (ref 150–400)
RBC: 2.68 MIL/uL — ABNORMAL LOW (ref 4.22–5.81)
RDW: 14.6 % (ref 11.5–15.5)
WBC: 8.1 10*3/uL (ref 4.0–10.5)
nRBC: 0 % (ref 0.0–0.2)

## 2023-11-16 LAB — GLUCOSE, CAPILLARY
Glucose-Capillary: 107 mg/dL — ABNORMAL HIGH (ref 70–99)
Glucose-Capillary: 122 mg/dL — ABNORMAL HIGH (ref 70–99)
Glucose-Capillary: 133 mg/dL — ABNORMAL HIGH (ref 70–99)
Glucose-Capillary: 119 mg/dL — ABNORMAL HIGH (ref 70–99)

## 2023-11-16 LAB — MAGNESIUM: Magnesium: 1.8 mg/dL (ref 1.7–2.4)

## 2023-11-16 LAB — CK: Total CK: 366 U/L (ref 49–397)

## 2023-11-16 LAB — PHOSPHORUS: Phosphorus: 3.6 mg/dL (ref 2.5–4.6)

## 2023-11-16 MED ORDER — METOPROLOL TARTRATE 50 MG PO TABS
50.0000 mg | ORAL_TABLET | Freq: Two times a day (BID) | ORAL | Status: DC
  Administered 2023-11-16 – 2023-11-17 (×2): 50 mg via ORAL
  Filled 2023-11-16 (×2): qty 1

## 2023-11-16 MED ORDER — GABAPENTIN 300 MG PO CAPS
300.0000 mg | ORAL_CAPSULE | Freq: Three times a day (TID) | ORAL | Status: DC
  Administered 2023-11-16 – 2023-11-17 (×2): 300 mg via ORAL
  Filled 2023-11-16 (×2): qty 1

## 2023-11-16 MED ORDER — GABAPENTIN 100 MG PO CAPS
200.0000 mg | ORAL_CAPSULE | Freq: Two times a day (BID) | ORAL | Status: DC
  Administered 2023-11-16: 200 mg via ORAL
  Filled 2023-11-16: qty 2

## 2023-11-16 MED ORDER — MAGNESIUM SULFATE 2 GM/50ML IV SOLN
2.0000 g | Freq: Once | INTRAVENOUS | Status: AC
  Administered 2023-11-16: 2 g via INTRAVENOUS
  Filled 2023-11-16: qty 50

## 2023-11-16 MED ORDER — FUROSEMIDE 10 MG/ML IJ SOLN
40.0000 mg | Freq: Once | INTRAMUSCULAR | Status: AC
  Administered 2023-11-16: 40 mg via INTRAVENOUS
  Filled 2023-11-16: qty 4

## 2023-11-16 NOTE — Progress Notes (Signed)
Pt converted into afib. Current HR 97-102. Notified MD.

## 2023-11-16 NOTE — Telephone Encounter (Signed)
Patient Product/process development scientist completed.    The patient is insured through [employer]. Patient has Medicare and is not eligible for a copay card, but may be able to apply for patient assistance or Medicare RX Payment Plan (Patient Must reach out to their plan, if eligible for payment plan), if available.    Ran test claim for Eliquis 5 mg and the current 30 day co-pay is $40.00.   This test claim was processed through [facility]- copay amounts may vary at other pharmacies due to pharmacy/plan contracts, or as the patient moves through the different stages of their insurance plan.     Manuel Winters, CPHT Pharmacy Technician III Certified Patient Advocate [facility] Patient Advocate Team Direct Number: [phone]  Fax: [phone]

## 2023-11-16 NOTE — Progress Notes (Signed)
PROGRESS NOTE    Manuel Winters  WNU:[id number] DOB: [birth year] DOA: 11/05/2023 PCP: Oletha Blend, MD   Brief Narrative:  This 78 yrs old Male with PMH significant for hypertension, diabetes type 2, hyperlipidemia, CKD 3, recent ED and outpatient visits for right leg pain, who presents due to persistent symptoms. Initially reports that his pain has been ongoing for about 1 month. He was chasing a truck and tripped and fell, reports about a week later developed pain from below the knee down to the foot which is intermittent and severe. He denies any numbness or weakness in the foot but does have deficits on exam. Right leg has been cool. Pain has been progressive especially in the past week, has been trying to ambulate with the use of crutches but in the past few days not able to get up much at all. Had no known history of PAD or other ASCVD prior, not taking antiplatelets or anticoagulants prior to this admission. No history of bleeding issues. He was admitted for further evaluation.   **Interim History Subsequently he underwent a right CFA endarterectomy with bovine patch angioplasty, right lower extremity thrombectomy abdominal popliteal and tibial thrombectomy of the AT, PT and peroneal arteries and 4 compartment fasciotomy with subsequent medial fasciotomy and closure performed on 11/09/2022.  *11/12/23: Blood Count dropped overnight so he will be typed and screend and transfused 1 unit of pRBC's. Having quite a bit of pain so will need to continue Analgesics. PT/OT Recommending Home Health.   On 11/13/2023 is doing much better.  Unfortunately spiked a slight temperature so we will obtain blood cultures, chest x-ray, urinalysis and obtain lactic acid level and procalcitonin level in the morning.  On 11/14/2023 he had some pain but doing otherwise fairly well. CK continues to trend down and likely plateau' ed. Today he is doing ok but still having Pain. Anticipating D/C in the next 24 hours now that  he has been afebrile for 2 days and because CK is nearly normalized.   On 11/15/2023 he had severely uncontrolled pain and will need to make  further adjustments. Gabapentin increased and CK resolved. Oxycodone is being continued as well as the IV Hydromorphone.   Assessment and Plan:  Right popliteal artery occlusion suspect subacute Right foot drop, Severe ischemic injury PAD with bilateral SFA occlusions -Patient has chronic PAD now presented with worsening right leg symptoms. -Etiology of this possibly atherothrombotic /embolic from underlying PAD v less likely cardioembolic with newly identified Aflutter.  RLE below the knee is cold with no pedal pulses on the Right, there is numbness in stocking distribution below the ankle on the R side, and strength is 3/5 with dorsiflexion at the ankle, no tissue loss.  -CTA with complete occlusion at R popliteal with no runoff to the foot, additional occlusions R SFA origin + prox SFA with reconstitution, and L CFA with reconstitution and 3 vessel runoff on the left.  -Vascular surgery Dr. Lenell Antu recommends for transfer to [facility] and initiation of heparin drip.  -Drip was continued and now being changed to DOAC at the direction of the vascular surgery team -Serial neurovascular checks of the right lower extremity -Adequate pain control. -Patient underwent Right CFA endarterectomy with bovine patch angiopasty, RLE thrombectomy, cutdown popliteal with tibial thrombectomy of the AT, PT, peroneal arteries and 4 compartment fasciotomies 11/06/2023 by Dr. Chestine Spore  -Continue Pain control.  -Subsequently patient underwent medial fasciotomy and closure performed 11/09/2022. -Pain control po Acetaminophen 1000 mg po q6hprn Mild  Pain, IV Hydromorphone 0.5 mg q4hprn Breakthrough Pain; C/w Oxycodone IR 5 mg q4hprn Moderate Pain or 10 mg po q4hprn Severe Pain  -Increase Gabapentin from 200 mg po BID to 300 mg TID given uncontrolled pain -PT recommending Home Health  and OT recommending SNF -Vascular evaluated and he continues to have brisk Right peroneal doppler signal -Continue to Monitor and continue to mobilize with therapy -Patient continues to haves significant pain in the right lower extremity along with motor deficits consistent with his initial presentation due to prolonged ischemia and PT OT recommends home health at this point but pain is uncontrolled today; HR is now elevated again in the setting of Pain -If pain is uncontrolled may need to image leg again    Lactic Acidosis:  -Continue with IV fluids.  -Lactic acid normalized. Recent Labs  Lab 11/05/23 1952 11/05/23 2135 11/08/23 0259 11/14/23 0304  LATICACIDVEN 1.9 2.1* 1.1 0.8    Rhabdomyolysis, improved  -Elevated CKD levels. Continued with IV fluids with NS at 75 mL/hr -CK Level trend went from 5733 -> 12,526 -> 9430 -> 3896 -> 1646 -> 776 -> 459 -> 477 -> 366 -CK level continues to remain elevated but almost normalized, trend CK level.   Acute myocardial injury: -No history of chest pain.  High-sensitivity troponin 22 -> 26.   -EKG with a flutter with no acute ischemic changes. -Likely demand event in the setting of arrhythmia, management directed at underlying A-fib above.   Constipation -Initiated Bowel regimen 17 grams po BID, Senna-Docusate 1 tab po BID, and Bisacodyl 10 mg RC Daily PRN Moderate Constipation   Persistent Atrial Flutter/Atrial Fib  -Patient presented with atrial flutter, started on IV diltiazem drip. -Echo showed LVEF 55 to 60%, TSH normal -Heart rate was controlled and had converted but now back into A Flutter -Increased Metoprolol Tartrate 25 mg twice daily to 50 mg po BID -Cardizem discontinued. -CHADS2 score 5. Patient on IV heparin with plan to transition to DOAC before discharge when okay with vascular surgery. -If patient continued to remain in A-fib,  Consider outpatient DCCV after 3 weeks of uninterrupted AC. -Cardiology has signed off the case  now but if necessary will reconsult if necessary    AKI on CKD stage IIIa Metabolic Acidosis -IVF now stopped -Continue Holding chlorthalidone and losartan.  -BUN/Cr Trend: Recent Labs  Lab 11/08/23 0259 11/10/23 0312 11/11/23 0304 11/12/23 0312 11/13/23 0326 11/14/23 0304 11/15/23 0253  BUN 23 38* 29* 14 11 8  6*  CREATININE 1.48* 1.81* 1.49* 1.19 1.17 1.12 1.27*  -Had a Slight Metabolic Acidosis with a CO2 of 19, Chloride Level of 106, and AG of 9 -Avoid Nephrotoxic Medications, Contrast Dyes, Hypotension and Dehydration to Ensure Adequate Renal Perfusion and will need to Renally Adjust Meds -Continue to Monitor and Trend Renal Function carefully and repeat CMP in the AM   ABLA/Dilutional Drop -Hgb/Hct Trend: Recent Labs  Lab 11/09/23 0334 11/10/23 0312 11/11/23 0304 11/12/23 0312 11/13/23 0326 11/14/23 0304 11/15/23 0253  HGB 8.2* 8.5* 7.6* 6.3* 8.4* 8.1* 7.9*  HCT 24.5* 25.4* 22.6* 19.6* 25.3* 24.1* 23.6*  MCV 88.8 88.5 89.0 93.8 90.7 88.9 89.7  -Checked Anemia Panel and showed and Iron Level of 44, UBIC of 181, TIBC of 225, Saturation Ratio of 20, Ferritin Level of 251, Folate Level of 12.5, and 707 -Typed and Screend and Transfused 1 unit pRBCs given that Hgb dropped to 6.3 -Continue to Monitor for S/Sx of Bleeding; No overt bleeding ntoed -Repeat CBC in the  AM   Fever, improved  -Unclear Etiology and spiked a Temp of 100.4 the day before yesterday -Check Blood Cx x2 and showing NGTD at 3 Days -Checked CXR and showed no active disease  -Check U/A and unremarkable -Checked procalcitonin level and was <0.10 and lactic acid level was 0.8 -WBC Trend: Recent Labs  Lab 11/09/23 0334 11/10/23 0312 11/11/23 0304 11/12/23 0312 11/13/23 0326 11/14/23 0304 11/15/23 0253  WBC 8.6 8.1 7.8 7.2 9.0 7.5 6.8  -Continue to Monitor and Trend and repeat CBC in the AM -Hold Abx at this point but low threshold to initiate; Fever resolved  Essential Hypertension: -BP  parameters have improved.  -Continue Metoprolol Tartrate 25 mg twice daily. -Continue to Monitor BP per Protocol -Last BP reading was 129/76   DM Type 2  -Resume SSI with very sensitive NovoLog sliding scale AC -HbA1c was 7.2 -Hold PO DM meds. -Continue monitor CBG trend: Recent Labs  Lab 11/13/23 2150 11/14/23 0617 11/14/23 1229 11/14/23 1703 11/14/23 2115 11/15/23 0615 11/15/23 1221  GLUCAP 114* 121* 139* 110* 100* 115* 135*   Hypokalemia -Patient's K+ Level Trend: Recent Labs  Lab 11/08/23 0259 11/10/23 0312 11/11/23 0304 11/12/23 0312 11/13/23 0326 11/14/23 0304 11/15/23 0253  K 3.6 3.9 3.7 3.7 4.1 3.3* 3.8  -Replete with po KCL 40 mEQ BID yesterday  -Continue to Monitor and Replete as Necessary -Repeat CMP in the AM   Hypomagnesemia -Patient's Mag Level Trend: Recent Labs  Lab 11/07/23 0345 11/10/23 0312 11/11/23 0304 11/12/23 0312 11/13/23 0326 11/14/23 0304 11/15/23 0253  MG 1.7 1.8 1.6* 1.8 2.0 1.6* 1.8  -Replete with IV Mag Sulfate 2 grams again -Continue to Monitor and Replete as Necessary -Repeat Mag in the AM    Hypercalcemia, improved -Resolved with IV fluids and then was mildly hypocalcemic.  -Calcium Level Trend: Recent Labs  Lab 11/08/23 0259 11/10/23 0312 11/11/23 0304 11/12/23 0312 11/13/23 0326 11/14/23 0304 11/15/23 0253  CALCIUM 9.5 9.4 8.9 8.2* 9.1 8.8* 8.9  -Continue to Monitor and Trend and repeat CMP in the AM   GERD/GI Prophylaxis -Continue with PPI with Pantoprazole 40 mg po daily  Hypoalbuminemia -Patient's Albumin Trend: Recent Labs  Lab 11/05/23 2135 11/07/23 0345 11/10/23 0312 11/12/23 0312 11/13/23 0326 11/14/23 0304 11/15/23 0253  ALBUMIN 3.7 3.3* 2.8* 2.7* 2.9* 2.6* 2.5*  -Continue to Monitor and Trend and repeat CMP in the AM   DVT prophylaxis:  apixaban (ELIQUIS) tablet 5 mg    Code Status: Full Code Family Communication: No family present at bedside  Disposition Plan:  Level of care:  Progressive Status is: Inpatient Remains inpatient appropriate because: Needs further clinical Improvement in significant leg pain he is having   Consultants:  Vascular Surgery  Procedures:  As delineated as above  Antimicrobials:  Anti-infectives (From admission, onward)    Start     Dose/Rate Route Frequency Ordered Stop   11/10/23 0600  ceFAZolin (ANCEF) IVPB 1 g/50 mL premix       Note to Pharmacy: Send with pt to OR   1 g 100 mL/hr over 30 Minutes Intravenous On call 11/09/23 0802 11/10/23 1026   11/06/23 0600  penicillin g benzathine (BICILLIN LA) 1200000 UNIT/2ML injection 2.4 Million Units  Status:  Discontinued        2.4 Million Units Intramuscular  Once 11/06/23 0551 11/06/23 0551   11/06/23 0600  cefTRIAXone (ROCEPHIN) injection 500 mg  Status:  Discontinued        500 mg Intramuscular  Once 11/06/23  1610 11/06/23 0551       Subjective: Seen and examined by physical therapy and had significant pain this morning while ambulating.  At rest his pain is a 2 out of 10.  Heart rate also jumped up due to the pain.  No nausea or vomiting.  Denies any lightheadedness or dizziness.  Objective: Vitals:   11/16/23 0940 11/16/23 0951 11/16/23 1142 11/16/23 1700  BP:  135/85 119/64 129/76  Pulse: (!) 109 (!) 101 (!) 105 (!) 59  Resp: (!) 22 (!) 22 20 20   Temp:  98.4 F (36.9 C) 98.9 F (37.2 C) 99.1 F (37.3 C)  TempSrc:   Oral Oral  SpO2: 95% 94% 95% 97%  Weight:      Height:        Intake/Output Summary (Last 24 hours) at 11/16/2023 1808 Last data filed at 11/16/2023 1500 Gross per 24 hour  Intake 770 ml  Output 950 ml  Net -180 ml   Filed Weights   11/05/23 1731 11/06/23 0915  Weight: 73.4 kg 72.6 kg   Examination: Physical Exam:  Constitutional: Thin chronically ill-appearing African-American male in no acute distress who appears uncomfortable Respiratory: Diminished to auscultation bilaterally, no wheezing, rales, rhonchi or crackles. Normal respiratory  effort and patient is not tachypenic. No accessory muscle use.  Unlabored breathing Cardiovascular: Irregularly Irregular and slightly tachycardic, no murmurs / rubs / gallops. S1 and S2 auscultated. 1+ Edema on the Right Leg Abdomen: Soft, non-tender, non-distended. Bowel sounds positive.  GU: Deferred. Musculoskeletal: No clubbing / cyanosis of digits/nails. No joint deformity upper and lower extremities Skin: Right leg incisions with staples are noted appear clean dry intact with minimal swelling compared to Neurologic: CN 2-12 grossly intact with no focal deficits. Romberg sign and cerebellar reflexes not assessed.  Psychiatric: Normal judgment and insight. Alert and oriented x 3. Anxious Mood  Data Reviewed: I have personally reviewed following labs and imaging studies  CBC: Recent Labs  Lab 11/12/23 0312 11/13/23 0326 11/14/23 0304 11/15/23 0253 11/16/23 0311  WBC 7.2 9.0 7.5 6.8 8.1  NEUTROABS 4.8 5.8 5.2 4.6 5.4  HGB 6.3* 8.4* 8.1* 7.9* 8.0*  HCT 19.6* 25.3* 24.1* 23.6* 24.6*  MCV 93.8 90.7 88.9 89.7 91.8  PLT 199 237 234 247 274   Basic Metabolic Panel: Recent Labs  Lab 11/12/23 0312 11/13/23 0326 11/14/23 0304 11/15/23 0253 11/16/23 0311  NA 131* 141 137 138 134*  K 3.7 4.1 3.3* 3.8 3.6  CL 103 112* 109 111 106  CO2 18* 22 20* 22 19*  GLUCOSE 129* 129* 128* 143* 110*  BUN 14 11 8  6* 6*  CREATININE 1.19 1.17 1.12 1.27* 1.15  CALCIUM 8.2* 9.1 8.8* 8.9 8.7*  MG 1.8 2.0 1.6* 1.8 1.8  PHOS 2.3* 3.3 3.0 2.9 3.6   GFR: Estimated Creatinine Clearance: 51.2 mL/min (by C-G formula based on SCr of 1.15 mg/dL). Liver Function Tests: Recent Labs  Lab 11/12/23 0312 11/13/23 0326 11/14/23 0304 11/15/23 0253 11/16/23 0311  AST 100* 64* 42* 45* 34  ALT 19 19 19 19 18   ALKPHOS 44 50 46 54 58  BILITOT 0.9 1.1 0.9 1.0 1.1  PROT 6.2* 6.7 6.3* 6.4* 6.7  ALBUMIN 2.7* 2.9* 2.6* 2.5* 2.6*   No results for input(s): "LIPASE", "AMYLASE" in the last 168 hours. No results  for input(s): "AMMONIA" in the last 168 hours. Coagulation Profile: No results for input(s): "INR", "PROTIME" in the last 168 hours. Cardiac Enzymes: Recent Labs  Lab 11/12/23 9604 11/13/23  1610 11/14/23 0304 11/15/23 0253 11/16/23 0311  CKTOTAL 1,646* 776* 459* 477* 366   BNP (last 3 results) No results for input(s): "PROBNP" in the last 8760 hours. HbA1C: No results for input(s): "HGBA1C" in the last 72 hours. CBG: Recent Labs  Lab 11/15/23 1608 11/15/23 2054 11/16/23 0610 11/16/23 1149 11/16/23 1716  GLUCAP 109* 120* 107* 122* 133*   Lipid Profile: No results for input(s): "CHOL", "HDL", "LDLCALC", "TRIG", "CHOLHDL", "LDLDIRECT" in the last 72 hours. Thyroid Function Tests: No results for input(s): "TSH", "T4TOTAL", "FREET4", "T3FREE", "THYROIDAB" in the last 72 hours. Anemia Panel: No results for input(s): "VITAMINB12", "FOLATE", "FERRITIN", "TIBC", "IRON", "RETICCTPCT" in the last 72 hours. Sepsis Labs: Recent Labs  Lab 11/14/23 0304  PROCALCITON <0.10  LATICACIDVEN 0.8   Recent Results (from the past 240 hours)  Culture, blood (Routine X 2) w Reflex to ID Panel     Status: None (Preliminary result)   Collection Time: 11/13/23  2:44 PM   Specimen: BLOOD LEFT HAND  Result Value Ref Range Status   Specimen Description BLOOD LEFT HAND  Final   Special Requests   Final    BOTTLES DRAWN AEROBIC AND ANAEROBIC Blood Culture results may not be optimal due to an inadequate volume of blood received in culture bottles   Culture   Final    NO GROWTH 3 DAYS Performed at [facility], [address], [place], [place] [address]    Report Status PENDING  Incomplete  Culture, blood (Routine X 2) w Reflex to ID Panel     Status: None (Preliminary result)   Collection Time: 11/13/23  6:48 PM   Specimen: BLOOD  Result Value Ref Range Status   Specimen Description BLOOD LEFT ANTECUBITAL  Final   Special Requests   Final    BOTTLES DRAWN AEROBIC AND ANAEROBIC Blood  Culture results may not be optimal due to an inadequate volume of blood received in culture bottles   Culture   Final    NO GROWTH 3 DAYS Performed at [facility], [address], [place], [place] [address]    Report Status PENDING  Incomplete    Radiology Studies: No results found.  Scheduled Meds:  apixaban  5 mg Oral BID   gabapentin  300 mg Oral TID   insulin aspart  0-6 Units Subcutaneous TID WC   latanoprost  1 drop Both Eyes QHS   metoprolol tartrate  50 mg Oral BID   pantoprazole  40 mg Oral Daily   polyethylene glycol  17 g Oral BID   senna-docusate  1 tablet Oral BID   sodium chloride flush  3 mL Intravenous Q12H   Continuous Infusions:   LOS: 11 days   Marguerita Merles, DO [facility] Available via Epic secure chat 7am-7pm After these hours, please refer to coverage provider listed on [email or web address] 11/16/2023, 6:08 PM

## 2023-11-16 NOTE — Care Management Important Message (Signed)
Important Message  Patient Details  Name: Manuel Winters MRN: [id number] Date of Birth: Aug 22, 1946   Important Message Given:  Yes - Medicare IM     Renie Ora 11/16/2023, 10:40 AM

## 2023-11-16 NOTE — Progress Notes (Addendum)
  Progress Note    11/16/2023 9:33 AM 6 Days Post-Op  Subjective:  crying in right foot pain, says sharp shooting pains from knee down and across forefoot   Vitals:   11/16/23 0815 11/16/23 0817  BP: 135/85 135/85  Pulse: (!) 104 (!) 110  Resp: (!) 21 20  Temp: 98.4 F (36.9 C) 98.4 F (36.9 C)  SpO2: 96% 99%   Physical Exam: Cardiac:  tachycardia Lungs:  non labored Incisions:  right groin incision and right lower leg incisions intact, staples present Extremities:  right lower extremity is well perfused Neurologic: alert and oriented  CBC    Component Value Date/Time   WBC 8.1 11/16/2023 0311   RBC 2.68 (L) 11/16/2023 0311   HGB 8.0 (L) 11/16/2023 0311   HCT 24.6 (L) 11/16/2023 0311   PLT 274 11/16/2023 0311   MCV 91.8 11/16/2023 0311   MCH 29.9 11/16/2023 0311   MCHC 32.5 11/16/2023 0311   RDW 14.6 11/16/2023 0311   LYMPHSABS 1.5 11/16/2023 0311   MONOABS 0.9 11/16/2023 0311   EOSABS 0.2 11/16/2023 0311   BASOSABS 0.0 11/16/2023 0311    BMET    Component Value Date/Time   NA 134 (L) 11/16/2023 0311   K 3.6 11/16/2023 0311   CL 106 11/16/2023 0311   CO2 19 (L) 11/16/2023 0311   GLUCOSE 110 (H) 11/16/2023 0311   BUN 6 (L) 11/16/2023 0311   CREATININE 1.15 11/16/2023 0311   CALCIUM 8.7 (L) 11/16/2023 0311   GFRNONAA >60 11/16/2023 0311    INR    Component Value Date/Time   INR 1.1 11/06/2023 0411     Intake/Output Summary (Last 24 hours) at 11/16/2023 0933 Last data filed at 11/16/2023 1610 Gross per 24 hour  Intake 530 ml  Output 800 ml  Net -270 ml     Assessment/Plan:  78 y.o. male is s/p Right CFA endarterectomy with bovine patch angiopasty, RLE thrombectomy, cutdown popliteal with tibial thrombectomy of the AT, PT, peroneal arteries and 4 compartment fasciotomies 11/06/2023 by Dr. Chestine Spore and closure of right medial fasciotomy 11/10/2023 by Dr. Myra Gianotti  6 Days Post-Op   Continues to have sharp pains in right leg Gabapentin increased this  morning Incisions are all intact and well appearing Continue Eliquis  Okay for d/c from vascular standpoint when [facility] arranged Has follow up on 2/11 for staple removal  Graceann Congress, PA-C [facility] [phone] 11/16/2023 9:33 AM  I have seen and evaluated the patient. I agree with the PA note as documented above.  78 year old male now status post right common femoral endarterectomy with bovine patch and right lower extremity thrombectomy including popliteal cutdown with fasciotomies.  Still has brisk signals in the right foot in the peroneal and AT.  On Eliquis.  Having what sounds like significant neuropathic pain in the right leg.  States this is intermittent shooting into the foot.  I did increase his gabapentin to 200 mg twice a day to see if this helps.  Cephus Shelling, MD [facility] [place] Office: [phone]

## 2023-11-16 NOTE — Progress Notes (Signed)
Physical Therapy Treatment Patient Details Name: Manuel Winters MRN: [id number] DOB: Mar 18, 1946 Today's Date: 11/16/2023   History of Present Illness Pt is a 78 y/o male presenting 11/05/23 with persistent right lower extremity pain in setting of limb ischemia. Underwent R common femoral endarterectomy, thrombectomy via femoral approach, popliteal cutdown w/ tibial thrombectomy and 4 compartment fasciotomies on 1/17. PMH: HTN, DM2, HLD, CKD    PT Comments  Pt with reports of 10/10 pain earlier this morning, received pain medication and while at rest R LE at 2/10 pain. Pt eager to mobilize however pain increased to 10/10 once pt began ambulating and was unable to place any weight through R LE. Pt's HR also increased to 170bpm and converted into afib from a resting HR in the 90s. Discussed with patient that it is unsafe for him to return home alone due to his high risk of falling, in which he agreed stating "Yeah if I fall, I won't be able to get myself back up." Pt also adamant about not going to a "rest home" for rehab. Pt to talk to wife and dtr about having them take off a few days each until patient is more steady on his feet and safe to be home alone. Acute PT to cont to follow.    If plan is discharge home, recommend the following: A little help with walking and/or transfers;A little help with bathing/dressing/bathroom;Assistance with cooking/housework;Help with stairs or ramp for entrance;Assist for transportation   Can travel by private vehicle        Equipment Recommendations  Rolling walker (2 wheels);BSC/3in1;Other (comment) (transport wheelchair)    Recommendations for Other Services       Precautions / Restrictions Precautions Precautions: Fall;Other (comment) Precaution Comments: watch HR Restrictions Weight Bearing Restrictions Per Provider Order: No     Mobility  Bed Mobility Overal bed mobility: Needs Assistance Bed Mobility: Supine to Sit     Supine to sit: Min  assist, HOB elevated, Used rails     General bed mobility comments: increased time, minA for LE management off EOB, definite use of bedrails    Transfers Overall transfer level: Needs assistance Equipment used: Rolling walker (2 wheels) Transfers: Sit to/from Stand, Bed to chair/wheelchair/BSC Sit to Stand: Min assist           General transfer comment: verbal cues for hand placment, minA to power up, posterior bias, minimal R LE WBing due to pain, unsteady/off balance requiring minA during hand transition from bed to RW    Ambulation/Gait Ambulation/Gait assistance: Min assist Gait Distance (Feet): 35 Feet Assistive device: Rolling walker (2 wheels) Gait Pattern/deviations: Step-to pattern, Trunk flexed, Antalgic Gait velocity: decreased Gait velocity interpretation: <1.31 ft/sec, indicative of household ambulator   General Gait Details: Primarily maintaining NWB RLE due to pain, HR Increased from 97 bpm to 170bpm with noted AFIB, RN aware. noted SOB as well, upon sitting pts HR immeadiately started coming down and was in 100s s/p 2 min. Pt c/o 10/10 pain in R LE when attempting to ambulate   Stairs             Wheelchair Mobility     Tilt Bed    Modified Rankin (Stroke Patients Only)       Balance Overall balance assessment: Needs assistance Sitting-balance support: No upper extremity supported, Feet supported Sitting balance-Leahy Scale: Good Sitting balance - Comments: EOB   Standing balance support: Bilateral upper extremity supported, Reliant on assistive device for balance, During functional activity Standing balance-Leahy  Scale: Poor Standing balance comment: Reliant on RW for support while attempting to keep weigth off RLE                            Cognition Arousal: Alert Behavior During Therapy: Anxious Overall Cognitive Status: Within Functional Limits for tasks assessed                                 General  Comments: motivated to progress but limited by pain, adamant about not going to a "rest home" but also aware he can't be at home alone because he can't get himself up if he falls        Exercises Other Exercises Other Exercises: gentle passive R ankle DF to tolerance Other Exercises: worked on progressive WBing through R LE while in sitting    General Comments General comments (skin integrity, edema, etc.): HR increased to 170bpm and was noted to be in afib during amb      Pertinent Vitals/Pain Pain Assessment Pain Assessment: 0-10 Pain Score: 10-Worst pain ever Pain Location: R leg Pain Descriptors / Indicators: Discomfort, Grimacing, Guarding, Sharp, Moaning (minimal WBing)    Home Living                          Prior Function            PT Goals (current goals can now be found in the care plan section) Acute Rehab PT Goals Patient Stated Goal: home PT Goal Formulation: With patient/family Time For Goal Achievement: 11/22/23 Potential to Achieve Goals: Good Progress towards PT goals: Not progressing toward goals - comment (limited by pain)    Frequency    Min 1X/week      PT Plan      Co-evaluation              AM-PAC PT "6 Clicks" Mobility   Outcome Measure  Help needed turning from your back to your side while in a flat bed without using bedrails?: A Little Help needed moving from lying on your back to sitting on the side of a flat bed without using bedrails?: A Little Help needed moving to and from a bed to a chair (including a wheelchair)?: A Little Help needed standing up from a chair using your arms (e.g., wheelchair or bedside chair)?: A Little Help needed to walk in hospital room?: A Lot Help needed climbing 3-5 steps with a railing? : Total 6 Click Score: 15    End of Session Equipment Utilized During Treatment: Gait belt Activity Tolerance: Patient limited by pain Patient left: in chair;with call bell/phone within reach;with  family/visitor present;with chair alarm set Nurse Communication: Mobility status PT Visit Diagnosis: Unsteadiness on feet (R26.81);Other abnormalities of gait and mobility (R26.89);Muscle weakness (generalized) (M62.81);Difficulty in walking, not elsewhere classified (R26.2);Pain Pain - Right/Left: Right Pain - part of body: Leg     Time: 0842-0906 PT Time Calculation (min) (ACUTE ONLY): 24 min  Charges:    $Gait Training: 8-22 mins PT General Charges $$ ACUTE PT VISIT: 1 Visit                     Lewis Shock, PT, DPT Acute Rehabilitation Services Secure chat preferred Office #: [phone]    Iona Hansen 11/16/2023, 10:17 AM

## 2023-11-17 ENCOUNTER — Other Ambulatory Visit (HOSPITAL_COMMUNITY): Payer: Self-pay

## 2023-11-17 DIAGNOSIS — I70209 Unspecified atherosclerosis of native arteries of extremities, unspecified extremity: Secondary | ICD-10-CM | POA: Diagnosis not present

## 2023-11-17 DIAGNOSIS — I480 Paroxysmal atrial fibrillation: Secondary | ICD-10-CM | POA: Diagnosis not present

## 2023-11-17 LAB — COMPREHENSIVE METABOLIC PANEL
ALT: 16 U/L (ref 0–44)
AST: 25 U/L (ref 15–41)
Albumin: 2.6 g/dL — ABNORMAL LOW (ref 3.5–5.0)
Alkaline Phosphatase: 51 U/L (ref 38–126)
Anion gap: 8 (ref 5–15)
BUN: 6 mg/dL — ABNORMAL LOW (ref 8–23)
CO2: 24 mmol/L (ref 22–32)
Calcium: 9.2 mg/dL (ref 8.9–10.3)
Chloride: 107 mmol/L (ref 98–111)
Creatinine, Ser: 1.24 mg/dL (ref 0.61–1.24)
GFR, Estimated: 60 mL/min — ABNORMAL LOW (ref >60)
Glucose, Bld: 122 mg/dL — ABNORMAL HIGH (ref 70–99)
Potassium: 3.7 mmol/L (ref 3.5–5.1)
Sodium: 139 mmol/L (ref 135–145)
Total Bilirubin: 1.4 mg/dL — ABNORMAL HIGH (ref 0.0–1.2)
Total Protein: 6.7 g/dL (ref 6.5–8.1)

## 2023-11-17 LAB — CBC WITH DIFFERENTIAL/PLATELET
Abs Immature Granulocytes: 0.03 10*3/uL (ref 0.00–0.07)
Basophils Absolute: 0 10*3/uL (ref 0.0–0.1)
Basophils Relative: 0 %
Eosinophils Absolute: 0.2 10*3/uL (ref 0.0–0.5)
Eosinophils Relative: 2 %
HCT: 24.6 % — ABNORMAL LOW (ref 39.0–52.0)
Hemoglobin: 8.1 g/dL — ABNORMAL LOW (ref 13.0–17.0)
Immature Granulocytes: 0 %
Lymphocytes Relative: 17 %
Lymphs Abs: 1.5 10*3/uL (ref 0.7–4.0)
MCH: 29.3 pg (ref 26.0–34.0)
MCHC: 32.9 g/dL (ref 30.0–36.0)
MCV: 89.1 fL (ref 80.0–100.0)
Monocytes Absolute: 0.9 10*3/uL (ref 0.1–1.0)
Monocytes Relative: 11 %
Neutro Abs: 6 10*3/uL (ref 1.7–7.7)
Neutrophils Relative %: 70 %
Platelets: 285 10*3/uL (ref 150–400)
RBC: 2.76 MIL/uL — ABNORMAL LOW (ref 4.22–5.81)
RDW: 14.5 % (ref 11.5–15.5)
WBC: 8.5 10*3/uL (ref 4.0–10.5)
nRBC: 0 % (ref 0.0–0.2)

## 2023-11-17 LAB — GLUCOSE, CAPILLARY
Glucose-Capillary: 110 mg/dL — ABNORMAL HIGH (ref 70–99)
Glucose-Capillary: 131 mg/dL — ABNORMAL HIGH (ref 70–99)

## 2023-11-17 LAB — PHOSPHORUS: Phosphorus: 3.5 mg/dL (ref 2.5–4.6)

## 2023-11-17 LAB — MAGNESIUM: Magnesium: 1.7 mg/dL (ref 1.7–2.4)

## 2023-11-17 MED ORDER — LOSARTAN POTASSIUM 50 MG PO TABS
50.0000 mg | ORAL_TABLET | Freq: Every day | ORAL | 0 refills | Status: DC
  Filled 2023-11-17: qty 30, 30d supply, fill #0

## 2023-11-17 MED ORDER — OXYCODONE HCL 5 MG PO TABS
5.0000 mg | ORAL_TABLET | ORAL | 0 refills | Status: AC | PRN
  Filled 2023-11-17: qty 20, 4d supply, fill #0

## 2023-11-17 MED ORDER — POLYETHYLENE GLYCOL 3350 17 GM/SCOOP PO POWD
17.0000 g | Freq: Every day | ORAL | 0 refills | Status: AC | PRN
  Filled 2023-11-17: qty 238, 14d supply, fill #0

## 2023-11-17 MED ORDER — GABAPENTIN 300 MG PO CAPS
300.0000 mg | ORAL_CAPSULE | Freq: Three times a day (TID) | ORAL | 0 refills | Status: AC
  Filled 2023-11-17: qty 90, 30d supply, fill #0

## 2023-11-17 MED ORDER — ACETAMINOPHEN 325 MG PO TABS
650.0000 mg | ORAL_TABLET | Freq: Four times a day (QID) | ORAL | 0 refills | Status: AC | PRN
  Filled 2023-11-17 (×2): qty 20, 3d supply, fill #0

## 2023-11-17 MED ORDER — TIZANIDINE HCL 4 MG PO TABS
4.0000 mg | ORAL_TABLET | Freq: Three times a day (TID) | ORAL | 0 refills | Status: AC
  Filled 2023-11-17: qty 30, 10d supply, fill #0

## 2023-11-17 MED ORDER — METOPROLOL TARTRATE 50 MG PO TABS
50.0000 mg | ORAL_TABLET | Freq: Two times a day (BID) | ORAL | 0 refills | Status: DC
  Filled 2023-11-17: qty 60, 30d supply, fill #0

## 2023-11-17 MED ORDER — APIXABAN 5 MG PO TABS
5.0000 mg | ORAL_TABLET | Freq: Two times a day (BID) | ORAL | 0 refills | Status: DC
  Filled 2023-11-17: qty 60, 30d supply, fill #0

## 2023-11-17 MED ORDER — SENNOSIDES-DOCUSATE SODIUM 8.6-50 MG PO TABS
1.0000 | ORAL_TABLET | Freq: Every day | ORAL | 0 refills | Status: AC
  Filled 2023-11-17: qty 30, 30d supply, fill #0

## 2023-11-17 NOTE — Progress Notes (Signed)
[facility]  Subjective  - pain much better in right foot with increased gabapentin   Objective 124/81 91 99.7 F (37.6 C) (Oral) (!) 24 98%  Intake/Output Summary (Last 24 hours) at 11/17/2023 0800 Last data filed at 11/17/2023 1610 Gross per 24 hour  Intake 290 ml  Output 1825 ml  Net -1535 ml    Right foot warm Fasciotomy healing right leg  Laboratory Lab Results: Recent Labs    11/16/23 0311 11/17/23 0323  WBC 8.1 8.5  HGB 8.0* 8.1*  HCT 24.6* 24.6*  PLT 274 285   BMET Recent Labs    11/16/23 0311 11/17/23 0323  NA 134* 139  K 3.6 3.7  CL 106 107  CO2 19* 24  GLUCOSE 110* 122*  BUN 6* 6*  CREATININE 1.15 1.24  CALCIUM 8.7* 9.2    COAG Lab Results  Component Value Date   INR 1.1 11/06/2023   INR 1.1 11/05/2023   No results found for: "PTT"  Assessment/Planning:  Status post right common femoral endarterectomy with right lower extremity thrombectomy including popliteal cutdown with tibial thrombectomy and fasciotomies.  Neuropathic pain much better today with increasing gabapentin.  On Eliquis.  Foot remains warm and well-perfused.  Okay for discharge from our standpoint.  Cephus Shelling 11/17/2023 8:00 AM --

## 2023-11-17 NOTE — TOC Transition Note (Signed)
Transition of Care [facility]) - Discharge Note Donn Pierini RN, BSN Transitions of Care Unit 4E- RN Case Manager See Treatment Team for direct phone #   Patient Details  Name: Manuel Winters MRN: [id number] Date of Birth: [birth year]  Transition of Care [facility]) CM/SW Contact:  Darrold Span, RN Phone Number: 11/17/2023, 3:52 PM   Clinical Narrative:    Pt stable for discharge today, pt in bathroom, daughter at beside- discussed HH and DME needs. Per daughter pt has BSC at home, needs RW- daughter request order to be printed and she will take to local store to buy- MD sent request for order.  List provided for [facility] choice- Per CMS guidelines from [employer] website with star ratings (copy placed in shadow chart)- daughter has selected [facility] as first choice, then [facility] or [facility] as backup.   Daughter to transport home.   Order for DME- RW provided to pt to give to daughter (she had already gone to get car- volunteer here to take pt down).   Call made to [facility]- liaison- referral for HHPT/OT accepted- they will reach out to pt/family to schedule.    Final next level of care: Home w Home Health Services Barriers to Discharge: Barriers Resolved   Patient Goals and CMS Choice Patient states their goals for this hospitalization and ongoing recovery are:: return home   Choice offered to / list presented to : Adult Children      Discharge Placement               Home w/ [facility]        Discharge Plan and Services Additional resources added to the After Visit Summary for     Discharge Planning Services: CM Consult Post Acute Care Choice: Home Health, Durable Medical Equipment          DME Arranged: Walker rolling DME Agency:  (daughter to pick up at local store- order provided)       HH Arranged: PT, OT HH Agency: [facility] Care Date [facility] Agency Contacted: 11/17/23 Time HH Agency Contacted: 1330 Representative spoke with at [facility] Agency: Kandee Keen  Social  Drivers of Health (SDOH) Interventions SDOH Screenings   Food Insecurity: No Food Insecurity (11/07/2023)  Housing: Unknown (11/07/2023)  Transportation Needs: No Transportation Needs (11/07/2023)  Utilities: Not At Risk (11/07/2023)  Social Connections: Unknown (11/07/2023)  Tobacco Use: Low Risk  (11/10/2023)     Readmission Risk Interventions    11/17/2023    3:52 PM  Readmission Risk Prevention Plan  Transportation Screening Complete  Home Care Screening Complete  Medication Review (RN CM) Complete

## 2023-11-17 NOTE — Progress Notes (Signed)
Patient discharged from unit  medications and property returned to designated person. Discharge instructions reviewed and patient questions answered.

## 2023-11-17 NOTE — Plan of Care (Signed)
  Problem: Tissue Perfusion: Goal: Adequacy of tissue perfusion will improve Outcome: Progressing   Problem: Pain Managment: Goal: General experience of comfort will improve and/or be controlled Outcome: Progressing   Problem: Safety: Goal: Ability to remain free from injury will improve Outcome: Progressing

## 2023-11-17 NOTE — Discharge Summary (Signed)
Physician Discharge Summary   Patient: Manuel Winters MRN: [id number] DOB: 1946/09/02  Admit date:     11/05/2023  Discharge date: 11/17/23  Discharge Physician: Marguerita Merles, DO   PCP: Oletha Blend, MD   Recommendations at discharge:   Follow-up with PCP within 1 to 2 weeks and repeat CBC, CMP, mag, Phos within 1 week  Follow-up with vascular surgery within 1 week in outpatient setting Follow-up in outpatient setting with cardiology and have outpatient DCCV if continues to be in A-fib  Discharge Diagnoses: Principal Problem:   Arterial occlusion, lower extremity (HCC)  Resolved Problems:   * No resolved hospital problems. [facility] Course: This 78 yrs old Male with PMH significant for hypertension, diabetes type 2, hyperlipidemia, CKD 3, recent ED and outpatient visits for right leg pain, who presents due to persistent symptoms. Initially reports that his pain has been ongoing for about 1 month. He was chasing a truck and tripped and fell, reports about a week later developed pain from below the knee down to the foot which is intermittent and severe. He denies any numbness or weakness in the foot but does have deficits on exam. Right leg has been cool. Pain has been progressive especially in the past week, has been trying to ambulate with the use of crutches but in the past few days not able to get up much at all. Had no known history of PAD or other ASCVD prior, not taking antiplatelets or anticoagulants prior to this admission. No history of bleeding issues. He was admitted for further evaluation.   **Interim History Subsequently he underwent a right CFA endarterectomy with bovine patch angioplasty, right lower extremity thrombectomy abdominal popliteal and tibial thrombectomy of the AT, PT and peroneal arteries and 4 compartment fasciotomy with subsequent medial fasciotomy and closure performed on 11/09/2022.  *11/12/23: Blood Count dropped overnight so he will be typed and screend  and transfused 1 unit of pRBC's. Having quite a bit of pain so will need to continue Analgesics. PT/OT Recommending Home Health.   On 11/13/2023 is doing much better.  Unfortunately spiked a slight temperature so we will obtain blood cultures, chest x-ray, urinalysis and obtain lactic acid level and procalcitonin level in the morning.  On 11/14/2023 he had some pain but doing otherwise fairly well. CK continues to trend down and likely plateau' ed. Today he is doing ok but still having Pain. Anticipating D/C in the next 24 hours now that he has been afebrile for 2 days and because CK is nearly normalized.   On 11/15/2023 he had severely uncontrolled pain and will need to make  further adjustments.   On 11/16/2023  gabapentin increased and CK resolved. Oxycodone is being continued as well as the IV Hydromorphone.   Today he is improved and medically stable for discharge and will need to follow-up with PCP, cardiology and vascular surgeon outpatient setting  Assessment and Plan:  Right popliteal artery occlusion suspect subacute Right foot drop, Severe ischemic injury PAD with bilateral SFA occlusions -Patient has chronic PAD now presented with worsening right leg symptoms. -Etiology of this possibly atherothrombotic /embolic from underlying PAD v less likely cardioembolic with newly identified Aflutter.  RLE below the knee is cold with no pedal pulses on the Right, there is numbness in stocking distribution below the ankle on the R side, and strength is 3/5 with dorsiflexion at the ankle, no tissue loss.  -CTA with complete occlusion at R popliteal with no runoff to the foot, additional  occlusions R SFA origin + prox SFA with reconstitution, and L CFA with reconstitution and 3 vessel runoff on the left.  -Vascular surgery Dr. Lenell Antu recommends for transfer to [facility] and initiation of heparin drip.  -Drip was continued and now being changed to DOAC at the direction of the vascular surgery  team -Serial neurovascular checks of the right lower extremity -Adequate pain control. -Patient underwent Right CFA endarterectomy with bovine patch angiopasty, RLE thrombectomy, cutdown popliteal with tibial thrombectomy of the AT, PT, peroneal arteries and 4 compartment fasciotomies 11/06/2023 by Dr. Chestine Spore  -Continue Pain control.  -Subsequently patient underwent medial fasciotomy and closure performed 11/09/2022. -Pain control po Acetaminophen 1000 mg po q6hprn Mild Pain, IV Hydromorphone 0.5 mg q4hprn Breakthrough Pain; C/w Oxycodone IR 5 mg q4hprn Moderate Pain or 10 mg po q4hprn Severe Pain  -Increase Gabapentin from 200 mg po BID to 300 mg TID given uncontrolled pain -PT recommending Home Health and OT recommending SNF -Vascular evaluated and he continues to have brisk Right peroneal doppler signal -Continue to Monitor and continue to mobilize with therapy -Patient continuedto haves significant pain in the right lower extremity along with motor deficits consistent with his initial presentation due to prolonged ischemia and PT OT recommends home health at this point but pain is uncontrolled today; HR is now elevated again in the setting of Pain but now resolved.  He is now medically stable for discharge as leg pain is improved -If pain is uncontrolled may need to image leg again however he did quite well with the increased dose of gabapentin -He will need to follow-up with vascular surgery in outpatient setting   Lactic Acidosis:  -Continue with IV fluids.  -Lactic acid normalized. Recent Labs  Lab 11/05/23 1952 11/05/23 2135 11/08/23 0259 11/14/23 0304  LATICACIDVEN 1.9 2.1* 1.1 0.8    Rhabdomyolysis, improved  -Elevated CKD levels. Continued with IV fluids with NS at 75 mL/hr -CK Level trend went from 5733 -> 12,526 -> 9430 -> 3896 -> 1646 -> 776 -> 459 -> 477 -> 366 -CK level now normalized and will not recheck   Acute myocardial injury: -No history of chest pain.   High-sensitivity troponin 22 -> 26.   -EKG with a flutter with no acute ischemic changes. -Likely demand event in the setting of arrhythmia, management directed at underlying A-fib above.   Constipation -Initiated Bowel regimen 17 grams po BID, Senna-Docusate 1 tab po BID, and Bisacodyl 10 mg RC Daily PRN Moderate Constipation   Persistent Atrial Flutter/Atrial Fib  -Patient presented with atrial flutter, started on IV diltiazem drip. -Echo showed LVEF 55 to 60%, TSH normal -Heart rate was controlled and had converted but now back into A Flutter -Increased Metoprolol Tartrate 25 mg twice daily to 50 mg po BID -Cardizem discontinued. -CHADS2 score 5. Patient on IV heparin with plan to transition to DOAC before discharge when okay with vascular surgery. -If patient continued to remain in A-fib,  Consider outpatient DCCV after 3 weeks of uninterrupted AC. -Cardiology has signed off the case now but if necessary will reconsult if necessary but will need to follow-up in outpatient setting   AKI on CKD stage IIIa stable Metabolic Acidosis -IVF now stopped -Continue Holding chlorthalidone and losartan.  -BUN/Cr Trend: Recent Labs  Lab 11/11/23 0304 11/12/23 0312 11/13/23 0326 11/14/23 0304 11/15/23 0253 11/16/23 0311 11/17/23 0323  BUN 29* 14 11 8  6* 6* 6*  CREATININE 1.49* 1.19 1.17 1.12 1.27* 1.15 1.24  -Had a Slight Metabolic  Acidosis with a CO2 of 19, Chloride Level of 106, and AG of 9 is now improved with a CO2 of 24, anion gap of 8, chloride level 107 -Give a dose of IV Lasix 40 mg x 1 given his lower extremity leg swelling because he is 7 L positive -Avoid Nephrotoxic Medications, Contrast Dyes, Hypotension and Dehydration to Ensure Adequate Renal Perfusion and will need to Renally Adjust Meds -Continue to Monitor and Trend Renal Function carefully and repeat CMP in the AM   ABLA/Dilutional Drop, now stable -Hgb/Hct Trend: Recent Labs  Lab 11/11/23 0304 11/12/23 0312  11/13/23 0326 11/14/23 0304 11/15/23 0253 11/16/23 0311 11/17/23 0323  HGB 7.6* 6.3* 8.4* 8.1* 7.9* 8.0* 8.1*  HCT 22.6* 19.6* 25.3* 24.1* 23.6* 24.6* 24.6*  MCV 89.0 93.8 90.7 88.9 89.7 91.8 89.1  -Checked Anemia Panel and showed and Iron Level of 44, UBIC of 181, TIBC of 225, Saturation Ratio of 20, Ferritin Level of 251, Folate Level of 12.5, and 707 -Typed and Screend and Transfused 1 unit pRBCs given that Hgb dropped to 6.3 -Continue to Monitor for S/Sx of Bleeding; No overt bleeding ntoed -Repeat CBC within 1 week  Fever, improved  -Unclear Etiology and spiked a Temp of 100.4 during the last week -Check Blood Cx x2 and showing NGTD at 4 Days -Checked CXR and showed no active disease  -Check U/A and unremarkable -Checked procalcitonin level and was <0.10 and lactic acid level was 0.8 -WBC Trend: Recent Labs  Lab 11/11/23 0304 11/12/23 0312 11/13/23 0326 11/14/23 0304 11/15/23 0253 11/16/23 0311 11/17/23 0323  WBC 7.8 7.2 9.0 7.5 6.8 8.1 8.5  -Continue to Monitor and Trend and repeat CBC in the AM -Hold Abx at this point but low threshold to initiate; Fever resolved  Essential Hypertension: -BP parameters have improved.  -Continue Metoprolol Tartrate 25 mg twice daily. -Continue to Monitor BP per Protocol -Last BP reading was 129/76   DM Type 2  -Resume SSI with very sensitive NovoLog sliding scale AC -HbA1c was 7.2 -Hold PO DM meds. -Continue monitor CBG trend: Recent Labs  Lab 11/15/23 2054 11/16/23 0610 11/16/23 1149 11/16/23 1716 11/16/23 2119 11/17/23 0638 11/17/23 1238  GLUCAP 120* 107* 122* 133* 119* 110* 131*   Hypokalemia -Patient's K+ Level Trend: Recent Labs  Lab 11/11/23 0304 11/12/23 0312 11/13/23 0326 11/14/23 0304 11/15/23 0253 11/16/23 0311 11/17/23 0323  K 3.7 3.7 4.1 3.3* 3.8 3.6 3.7  -Continue to Monitor and Replete as Necessary -Repeat CMP within 1 week  Hyperbilirubinemia -Mild.  Bilirubin trend: Recent Labs  Lab  11/10/23 0312 11/12/23 0312 11/13/23 0326 11/14/23 0304 11/15/23 0253 11/16/23 0311 11/17/23 0323  BILITOT 1.0 0.9 1.1 0.9 1.0 1.1 1.4*  -Continue to monitor and trend repeat CMP in outpatient setting within 1 week  Hypomagnesemia -Patient's Mag Level Trend: Recent Labs  Lab 11/11/23 0304 11/12/23 0312 11/13/23 0326 11/14/23 0304 11/15/23 0253 11/16/23 0311 11/17/23 0323  MG 1.6* 1.8 2.0 1.6* 1.8 1.8 1.7  -Replete with IV Mag Sulfate 2 grams again prior to discharge -Continue to Monitor and Replete as Necessary -Repeat Mag within 1 week   Hypercalcemia, improved -Resolved with IV fluids and then was mildly hypocalcemic.  -Calcium Level Trend: Recent Labs  Lab 11/11/23 0304 11/12/23 0312 11/13/23 0326 11/14/23 0304 11/15/23 0253 11/16/23 0311 11/17/23 0323  CALCIUM 8.9 8.2* 9.1 8.8* 8.9 8.7* 9.2  -Continue to Monitor and Trend and repeat CMP within 1 week   GERD/GI Prophylaxis -Continue with PPI with Pantoprazole 40  mg po daily  Hypoalbuminemia -Patient's Albumin Trend: Recent Labs  Lab 11/10/23 0312 11/12/23 0312 11/13/23 0326 11/14/23 0304 11/15/23 0253 11/16/23 0311 11/17/23 0323  ALBUMIN 2.8* 2.7* 2.9* 2.6* 2.5* 2.6* 2.6*  -Continue to Monitor and Trend and repeat CMP in the AM   Consultants: Neurology, vascular surgery Procedures performed: As delineated as above Disposition: Home health Diet recommendation:  Discharge Diet Orders (From admission, onward)     Start     Ordered   11/17/23 0000  Diet - low sodium heart healthy        11/17/23 1304           Cardiac diet DISCHARGE MEDICATION: Allergies as of 11/17/2023   No Known Allergies      Medication List     STOP taking these medications    methylPREDNISolone 4 MG Tbpk tablet Commonly known as: MEDROL DOSEPAK   traMADol 50 MG tablet Commonly known as: ULTRAM       TAKE these medications    acetaminophen 325 MG tablet Commonly known as: TYLENOL Take 2 tablets  (650 mg total) by mouth every 6 (six) hours as needed for mild pain (pain score 1-3).   chlorthalidone 25 MG tablet Commonly known as: HYGROTON Take 25 mg by mouth every morning.   cholecalciferol 25 MCG (1000 UNIT) tablet Commonly known as: VITAMIN D3 Take 1,000 Units by mouth daily.   Eliquis 5 MG Tabs tablet Generic drug: apixaban Take 1 tablet (5 mg total) by mouth 2 (two) times daily.   felodipine 10 MG 24 hr tablet Commonly known as: PLENDIL Take 10 mg by mouth daily.   gabapentin 300 MG capsule Commonly known as: NEURONTIN Take 1 capsule (300 mg total) by mouth 3 (three) times daily.   latanoprost 0.005 % ophthalmic solution Commonly known as: XALATAN Place 1 drop into both eyes at bedtime.   losartan 50 MG tablet Commonly known as: COZAAR Take 1 tablet (50 mg total) by mouth daily. What changed:  medication strength how much to take   metoprolol tartrate 50 MG tablet Commonly known as: LOPRESSOR Take 1 tablet (50 mg total) by mouth 2 (two) times daily.   omeprazole 40 MG capsule Commonly known as: PRILOSEC Take 40 mg by mouth daily.   oxyCODONE 5 MG immediate release tablet Commonly known as: Oxy IR/ROXICODONE Take 1 tablet (5 mg total) by mouth every 4 (four) hours as needed for up to 5 days for moderate pain (pain score 4-6).   polyethylene glycol powder 17 GM/SCOOP powder Commonly known as: GLYCOLAX/MIRALAX Mix as directed and take 1 capful (17 g) by mouth daily as needed for mild constipation.   rosuvastatin 10 MG tablet Commonly known as: CRESTOR Take 10 mg by mouth at bedtime.   Senna-S 8.6-50 MG tablet Generic drug: senna-docusate Take 1 tablet by mouth at bedtime.   tiZANidine 4 MG tablet Commonly known as: ZANAFLEX Take 1 tablet (4 mg total) by mouth 3 (three) times daily.        Follow-up Information     Mealor, Roberts Gaudy, MD Follow up.   Specialty: Cardiology Why: office scheduler will contact you to arrange a follow up, please  give [place] a call if you do not hear from our scheduler in 3 bussiness days. Contact information: [address] [place] [address] [phone]         [facility] Follow up in 3 week(s).   Specialty: Vascular Surgery Contact  information: [address] [place] [address] [phone]        [facility] Follow up.   Specialty: Home Health Services Why: HHPT/OT arranged- they will contact you to schedule Contact information: [address] [place] [address] [phone]                Discharge Exam: Filed Weights   11/05/23 1731 11/06/23 0915  Weight: 73.4 kg 72.6 kg   Vitals:   11/17/23 0840 11/17/23 1242  BP: (!) 139/99 (!) 104/47  Pulse: 94 74  Resp: (!) 22 18  Temp: 99.2 F (37.3 C) 100 F (37.8 C)  SpO2: 99% 100%   Examination: Physical Exam:  Constitutional: WN/WD African American male in no acute distress appears calm and comfortable today Respiratory: Diminished to auscultation bilaterally, no wheezing, rales, rhonchi or crackles. Normal respiratory effort and patient is not tachypenic. No accessory muscle use.  Unlabored breathing Cardiovascular: RRR, no murmurs / rubs / gallops. S1 and S2 auscultated.  Has some mild lower extremity edema Abdomen: Soft, non-tender, non-distended. Bowel sounds positive.  GU: Deferred. Musculoskeletal: No clubbing / cyanosis of digits/nails. No joint deformity upper and lower extremities Skin: Leg incisions noted and they appear clean dry and intact Neurologic: CN 2-12 grossly intact with no focal deficits. Romberg sign and cerebellar reflexes not assessed.  Psychiatric: Normal judgment and insight. Alert and oriented x 3. Normal mood and appropriate affect.   Condition at discharge: stable  The results of significant diagnostics from this hospitalization (including imaging, microbiology, ancillary and laboratory) are  listed below for reference.   Imaging Studies: DG CHEST PORT 1 VIEW Result Date: 11/13/2023 CLINICAL DATA:  Fever. EXAM: PORTABLE CHEST 1 VIEW COMPARISON:  11/05/2023 FINDINGS: Stable heart size and mediastinal contours. Aortic atherosclerosis. No focal airspace disease. No pulmonary edema, pleural effusion or pneumothorax. No acute osseous findings. IMPRESSION: No acute chest findings. Electronically Signed   By: Narda Rutherford M.D.   On: 11/13/2023 19:58   ECHOCARDIOGRAM COMPLETE Result Date: 11/06/2023    ECHOCARDIOGRAM REPORT   Patient Name:   JOHNATHON OLDEN Date of Exam: 11/06/2023 Medical Rec #:  [id number]      Height:       68.0 in Accession #:    [id number]     Weight:       160.0 lb Date of Birth:  03-Oct-1946      BSA:          1.859 m Patient Age:    77 years       BP:           128/68 mmHg Patient Gender: M              HR:           100 bpm. Exam Location:  Inpatient Procedure: 2D Echo, Cardiac Doppler and Color Doppler Indications:    Atrial Flutter I48.92  History:        Patient has no prior history of Echocardiogram examinations.  Sonographer:    Dondra Prader RVT RCS Referring Phys: Dolly Rias IMPRESSIONS  1. Left ventricular ejection fraction, by estimation, is 55 to 60%. The left ventricle has normal function. The left ventricle has no regional wall motion abnormalities. Left ventricular diastolic parameters are indeterminate.  2. Right ventricular systolic function is normal. The right ventricular size is normal.  3. The mitral valve is abnormal. Mild mitral valve regurgitation. No evidence of mitral stenosis.  4. Tricuspid valve  regurgitation is mild to moderate.  5. The aortic valve is tricuspid. There is mild calcification of the aortic valve. Aortic valve regurgitation is trivial. Aortic valve sclerosis is present, with no evidence of aortic valve stenosis.  6. Aortic dilatation noted. There is mild dilatation of the ascending aorta, measuring 39 mm.  7. The inferior vena cava is  normal in size with greater than 50% respiratory variability, suggesting right atrial pressure of 3 mmHg. FINDINGS  Left Ventricle: Left ventricular ejection fraction, by estimation, is 55 to 60%. The left ventricle has normal function. The left ventricle has no regional wall motion abnormalities. The left ventricular internal cavity size was normal in size. There is  no left ventricular hypertrophy. Left ventricular diastolic parameters are indeterminate. Right Ventricle: The right ventricular size is normal. No increase in right ventricular wall thickness. Right ventricular systolic function is normal. Left Atrium: Left atrial size was normal in size. Right Atrium: Right atrial size was normal in size. Pericardium: Trivial pericardial effusion is present. The pericardial effusion is posterior to the left ventricle. Mitral Valve: The mitral valve is abnormal. There is mild thickening of the mitral valve leaflet(s). Mild mitral valve regurgitation. No evidence of mitral valve stenosis. Tricuspid Valve: The tricuspid valve is normal in structure. Tricuspid valve regurgitation is mild to moderate. No evidence of tricuspid stenosis. Aortic Valve: The aortic valve is tricuspid. There is mild calcification of the aortic valve. Aortic valve regurgitation is trivial. Aortic valve sclerosis is present, with no evidence of aortic valve stenosis. Aortic valve mean gradient measures 2.3 mmHg. Aortic valve peak gradient measures 4.4 mmHg. Aortic valve area, by VTI measures 3.26 cm. Pulmonic Valve: The pulmonic valve was normal in structure. Pulmonic valve regurgitation is not visualized. No evidence of pulmonic stenosis. Aorta: Aortic dilatation noted. There is mild dilatation of the ascending aorta, measuring 39 mm. Venous: The inferior vena cava is normal in size with greater than 50% respiratory variability, suggesting right atrial pressure of 3 mmHg. IAS/Shunts: No atrial level shunt detected by color flow Doppler.  LEFT  VENTRICLE PLAX 2D LVIDd:         4.50 cm   Diastology LVIDs:         3.10 cm   LV e' medial:    8.59 cm/s LV PW:         1.10 cm   LV E/e' medial:  12.1 LV IVS:        0.80 cm   LV e' lateral:   14.90 cm/s LVOT diam:     2.00 cm   LV E/e' lateral: 7.0 LV SV:         49 LV SV Index:   26 LVOT Area:     3.14 cm  RIGHT VENTRICLE             IVC RV Basal diam:  2.90 cm     IVC diam: 1.00 cm RV Mid diam:    2.30 cm RV S prime:     14.80 cm/s TAPSE (M-mode): 1.6 cm LEFT ATRIUM             Index        RIGHT ATRIUM           Index LA diam:        3.60 cm 1.94 cm/m   RA Area:     12.20 cm LA Vol (A2C):   52.6 ml 28.30 ml/m  RA Volume:   25.30 ml  13.61 ml/m LA Vol (  A4C):   43.9 ml 23.59 ml/m LA Biplane Vol: 51.4 ml 27.65 ml/m  AORTIC VALVE                    PULMONIC VALVE AV Area (Vmax):    3.00 cm     PV Vmax:       0.84 m/s AV Area (Vmean):   2.82 cm     PV Peak grad:  2.8 mmHg AV Area (VTI):     3.26 cm AV Vmax:           104.33 cm/s AV Vmean:          69.300 cm/s AV VTI:            0.149 m AV Peak Grad:      4.4 mmHg AV Mean Grad:      2.3 mmHg LVOT Vmax:         99.75 cm/s LVOT Vmean:        62.100 cm/s LVOT VTI:          0.155 m LVOT/AV VTI ratio: 1.04  AORTA Ao Root diam: 3.10 cm Ao Asc diam:  3.90 cm MITRAL VALVE                TRICUSPID VALVE MV Area (PHT): 5.70 cm     TR Peak grad:   37.5 mmHg MV Decel Time: 133 msec     TR Vmax:        306.00 cm/s MV E velocity: 103.70 cm/s MV A velocity: 40.20 cm/s   SHUNTS MV E/A ratio:  2.58         Systemic VTI:  0.16 m                             Systemic Diam: 2.00 cm Charlton Haws MD Electronically signed by Charlton Haws MD Signature Date/Time: 11/06/2023/3:53:23 PM    Final    DG Chest Port 1 View Result Date: 11/05/2023 CLINICAL DATA:  AFib. EXAM: PORTABLE CHEST 1 VIEW COMPARISON:  08/11/2005. FINDINGS: The heart size and mediastinal contours are within normal limits. There is atherosclerotic calcification of the aorta. No consolidation, effusion, or  pneumothorax is seen. No acute osseous abnormality. IMPRESSION: No active disease. Electronically Signed   By: Thornell Sartorius M.D.   On: 11/05/2023 21:58   CT Angio Aortobifemoral W and/or Wo Contrast Result Date: 11/05/2023 CLINICAL DATA:  Claudication or leg ischemia Pt reports he has been having pain in his right foot for about 3 weeks and has had a negative [place] and xray and was given an MRI yesterday and was told a nerve was pressing on something EXAM: CT ANGIOGRAPHY OF ABDOMINAL AORTA WITH ILIOFEMORAL RUNOFF TECHNIQUE: Multidetector CT imaging of the abdomen, pelvis and lower extremities was performed using the standard protocol during bolus administration of intravenous contrast. Multiplanar CT image reconstructions and MIPs were obtained to evaluate the vascular anatomy. RADIATION DOSE REDUCTION: This exam was performed according to the departmental dose-optimization program which includes automated exposure control, adjustment of the mA and/or kV according to patient size and/or use of iterative reconstruction technique. CONTRAST:  OMNIPAQUE IOHEXOL 350 MG/ML SOLN COMPARISON:  None Available. FINDINGS: VASCULAR Aorta: Severe atherosclerotic plaque. Normal caliber aorta without aneurysm, dissection, vasculitis or significant stenosis. Celiac: Patent without evidence of aneurysm, dissection, vasculitis or significant stenosis. SMA: Patent without evidence of aneurysm, dissection, vasculitis or significant stenosis. Renals: Both renal arteries are patent without  evidence of aneurysm, dissection, vasculitis, fibromuscular dysplasia or significant stenosis. IMA: Patent without evidence of aneurysm, dissection, vasculitis or significant stenosis. RIGHT Lower Extremity Severe atherosclerotic plaque. Short segment severe narrowing of the left external iliac artery (4:103). 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 A 2 cm almost complete occlusion of the  origin of the RIGHT superficial femoral artery. Re-opacification distally. A 4 cm almost complete occlusion of the RIGHT proximal superficial femoral artery. Reopacification distally. LEFT Lower Extremity Severe atherosclerotic plaque. A 3 cm almost complete occlusion of the LEFT common femoral artery. Re-opacification distally with three-vessel runoff. VEINS: No obvious venous abnormality within the limitations of this arterial phase study. Review of the MIP images confirms the above findings. NON-VASCULAR Lower chest: No acute abnormality. Hepatobiliary: No focal liver abnormality. No gallstones, gallbladder wall thickening, or pericholecystic fluid. No biliary dilatation. Pancreas: No focal lesion. Normal pancreatic contour. No surrounding inflammatory changes. No main pancreatic ductal dilatation. Spleen: Normal in size without focal abnormality. Adrenals/Urinary Tract: No adrenal nodule bilaterally. Bilateral kidneys enhance symmetrically. Fluid density lesions of the left kidney likely represent simple renal cysts. Simple renal cysts, in the absence of clinically indicated signs/symptoms, require no independent follow-up. No hydronephrosis. No hydroureter. The urinary bladder is unremarkable. Stomach/Bowel: Stomach is within normal limits. No evidence of bowel wall thickening or dilatation. Appendix appears normal. Lymphatic: No lymphadenopathy. Reproductive: Prostate is unremarkable. Other: No intraperitoneal free fluid. No intraperitoneal free gas. No organized fluid collection. Musculoskeletal: No abdominal wall hernia or abnormality. No suspicious lytic or blastic osseous lesions. No acute displaced fracture. Multilevel degenerative changes of the spine. No evidence of fracture, dislocation, or joint effusion of either lower extremity. No evidence of severe arthropathy and no aggressive appearing focal bone abnormality a either lower extremities. Soft tissues are unremarkable. IMPRESSION: VASCULAR 1.   Aortic Atherosclerosis (ICD10-I70.0)-severe. 2. 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 3. A 2 cm almost complete occlusion of the origin of the RIGHT superficial femoral artery. Re-opacification distally. 4. A 4 cm almost complete occlusion of the RIGHT proximal superficial femoral artery. Reopacification distally. 5. A 3 cm almost complete occlusion of the LEFT common femoral artery. Re-opacification distally with three-vessel runoff. NON-VASCULAR 1. No acute intra-abdominal or intrapelvic abnormality. 2. No acute abnormality of the bilateral lower extremities. These results were called by telephone at the time of interpretation on 11/05/2023 at 9:00 pm to provider Ariel PA, who verbally acknowledged these results. Electronically Signed   By: Tish Frederickson M.D.   On: 11/05/2023 21:07   Microbiology: Results for orders placed or performed during the hospital encounter of 11/05/23  Surgical pcr screen     Status: Abnormal   Collection Time: 11/06/23  8:44 AM   Specimen: Nasal Mucosa; Nasal Swab  Result Value Ref Range Status   MRSA, PCR NEGATIVE NEGATIVE Final   Staphylococcus aureus POSITIVE (A) NEGATIVE Final    Comment: (NOTE) The Xpert SA Assay (FDA approved for NASAL specimens in patients 84 years of age and older), is one component of a comprehensive surveillance program. It is not intended to diagnose infection nor to guide or monitor treatment. Performed at [facility], [address] [place], [place] [address]   Culture, blood (Routine X 2) w Reflex to ID Panel     Status: None (Preliminary result)   Collection Time: 11/13/23  2:44 PM   Specimen: BLOOD LEFT HAND  Result Value  Ref Range Status   Specimen Description BLOOD LEFT HAND  Final   Special Requests   Final    BOTTLES DRAWN AEROBIC AND ANAEROBIC Blood Culture results may not be optimal due to an inadequate volume of blood received in culture bottles   Culture    Final    NO GROWTH 4 DAYS Performed at [facility], [address], [place], [place] [address]    Report Status PENDING  Incomplete  Culture, blood (Routine X 2) w Reflex to ID Panel     Status: None (Preliminary result)   Collection Time: 11/13/23  6:48 PM   Specimen: BLOOD  Result Value Ref Range Status   Specimen Description BLOOD LEFT ANTECUBITAL  Final   Special Requests   Final    BOTTLES DRAWN AEROBIC AND ANAEROBIC Blood Culture results may not be optimal due to an inadequate volume of blood received in culture bottles   Culture   Final    NO GROWTH 4 DAYS Performed at [facility], [address], [place], [place] [address]    Report Status PENDING  Incomplete   Labs: CBC: Recent Labs  Lab 11/13/23 0326 11/14/23 0304 11/15/23 0253 11/16/23 0311 11/17/23 0323  WBC 9.0 7.5 6.8 8.1 8.5  NEUTROABS 5.8 5.2 4.6 5.4 6.0  HGB 8.4* 8.1* 7.9* 8.0* 8.1*  HCT 25.3* 24.1* 23.6* 24.6* 24.6*  MCV 90.7 88.9 89.7 91.8 89.1  PLT 237 234 247 274 285   Basic Metabolic Panel: Recent Labs  Lab 11/13/23 0326 11/14/23 0304 11/15/23 0253 11/16/23 0311 11/17/23 0323  NA 141 137 138 134* 139  K 4.1 3.3* 3.8 3.6 3.7  CL 112* 109 111 106 107  CO2 22 20* 22 19* 24  GLUCOSE 129* 128* 143* 110* 122*  BUN 11 8 6* 6* 6*  CREATININE 1.17 1.12 1.27* 1.15 1.24  CALCIUM 9.1 8.8* 8.9 8.7* 9.2  MG 2.0 1.6* 1.8 1.8 1.7  PHOS 3.3 3.0 2.9 3.6 3.5   Liver Function Tests: Recent Labs  Lab 11/13/23 0326 11/14/23 0304 11/15/23 0253 11/16/23 0311 11/17/23 0323  AST 64* 42* 45* 34 25  ALT 19 19 19 18 16   ALKPHOS 50 46 54 58 51  BILITOT 1.1 0.9 1.0 1.1 1.4*  PROT 6.7 6.3* 6.4* 6.7 6.7  ALBUMIN 2.9* 2.6* 2.5* 2.6* 2.6*   CBG: Recent Labs  Lab 11/16/23 1149 11/16/23 1716 11/16/23 2119 11/17/23 0638 11/17/23 1238  GLUCAP 122* 133* 119* 110* 131*   Discharge time spent: greater than 30 minutes.  Signed: Marguerita Merles, DO [facility] 11/17/2023

## 2023-11-18 LAB — CULTURE, BLOOD (ROUTINE X 2)
Culture: NO GROWTH
Culture: NO GROWTH

## 2023-11-20 ENCOUNTER — Telehealth: Payer: Self-pay | Admitting: *Deleted

## 2023-11-20 NOTE — Telephone Encounter (Signed)
Pt. Daughter called in regard to Manuel Winters's pain after surgery . She feels they are unable to control it  or manage it. Looking in the charts pain management was a issue. Recommend adding 1000 mg of tylenol every 6 hours and taking his gabapentin with tylenol when due and alternating oxycodone  every 4 hours. If pain is sever than pt can take 2 tabs making a  total of 10 mg of oxycodone instead of 5 mg but encouraged to try tylenol first. Also told daughter that pt can try heat or ice on surgical area protecting the skin and elevate the leg. Informed daughter to call back if further questions or if pain is still not manageable.

## 2023-11-23 ENCOUNTER — Other Ambulatory Visit: Payer: Self-pay | Admitting: Physician Assistant

## 2023-11-23 ENCOUNTER — Telehealth: Payer: Self-pay

## 2023-11-23 MED ORDER — OXYCODONE-ACETAMINOPHEN 5-325 MG PO TABS: 1.0000 | ORAL_TABLET | Freq: Four times a day (QID) | ORAL | 0 refills | Status: DC | PRN

## 2023-11-23 NOTE — Telephone Encounter (Signed)
Triage Call: Daughter called reporting pt is having a lot of pain in his leg, that they spoke to a nurse on Friday and did what was suggested which helped until last night.  She states he did run out of Oxycodone - has 1 left & trying not give it to him.  She states he has been taking the Gabapentin and Zanaflex round the clock along with tylenol.  She denies any swelling accept a little in his foot, states his incision is a little black where the staples are but he is on eliquis.  Denies fever/chills.  Has post-op f/u scheduled for next week.  Consulted with PA-will refill oxycodone only at this time. Daughter notified and confirms understanding.

## 2023-11-24 ENCOUNTER — Ambulatory Visit: Payer: Medicare PPO | Admitting: Pulmonary Disease

## 2023-12-01 ENCOUNTER — Ambulatory Visit (INDEPENDENT_AMBULATORY_CARE_PROVIDER_SITE_OTHER): Payer: Medicare PPO | Admitting: Physician Assistant

## 2023-12-01 VITALS — BP 112/57 | HR 68 | Temp 98.0°F | Resp 18 | Ht 68.0 in | Wt 165.0 lb

## 2023-12-01 DIAGNOSIS — I70209 Unspecified atherosclerosis of native arteries of extremities, unspecified extremity: Secondary | ICD-10-CM

## 2023-12-01 NOTE — Progress Notes (Signed)
POST OPERATIVE OFFICE NOTE    CC:  F/u for surgery  HPI:  This is a 78 y.o. male who is s/p right common femoral endarterectomy with bovine patch angioplasty with right leg thrombectomy involving right popliteal cutdown and tibial thrombectomy and 4 compartment fasciotomies by Dr. Chestine Spore on 11/06/2023 due to acute limb ischemia of the right lower extremity with motor deficit.  Lateral fasciotomy was closed in the operating room.  He subsequently underwent medial fasciotomy incision closure by Dr. Myra Gianotti on 11/10/2023.  After revascularization he developed severe neuropathic pain which was maintained with gabapentin during hospitalization.  He was also discharged on a DOAC.  Patient believes the pain level has improved since discharge.  He recently started home health physical therapy and is ambulatory around the house with a walker.  He believes his incisions are healing well.  He can only minimally wiggle his toes however has full sensation of the right foot.  He is also taking statin daily.  He denies tobacco use.  No Known Allergies  Current Outpatient Medications  Medication Sig Dispense Refill   acetaminophen (TYLENOL) 325 MG tablet Take 2 tablets (650 mg total) by mouth every 6 (six) hours as needed for mild pain (pain score 1-3). 20 tablet 0   apixaban (ELIQUIS) 5 MG TABS tablet Take 1 tablet (5 mg total) by mouth 2 (two) times daily. 60 tablet 0   chlorthalidone (HYGROTON) 25 MG tablet Take 25 mg by mouth every morning.     cholecalciferol (VITAMIN D3) 25 MCG (1000 UNIT) tablet Take 1,000 Units by mouth daily.     felodipine (PLENDIL) 10 MG 24 hr tablet Take 10 mg by mouth daily.     gabapentin (NEURONTIN) 300 MG capsule Take 1 capsule (300 mg total) by mouth 3 (three) times daily. 90 capsule 0   latanoprost (XALATAN) 0.005 % ophthalmic solution Place 1 drop into both eyes at bedtime.     losartan (COZAAR) 50 MG tablet Take 1 tablet (50 mg total) by mouth daily. 30 tablet 0   metoprolol  tartrate (LOPRESSOR) 50 MG tablet Take 1 tablet (50 mg total) by mouth 2 (two) times daily. 60 tablet 0   omeprazole (PRILOSEC) 40 MG capsule Take 40 mg by mouth daily.     oxyCODONE-acetaminophen (PERCOCET/ROXICET) 5-325 MG tablet Take 1 tablet by mouth every 6 (six) hours as needed for severe pain (pain score 7-10). 20 tablet 0   polyethylene glycol powder (GLYCOLAX/MIRALAX) 17 GM/SCOOP powder Mix as directed and take 1 capful (17 g) by mouth daily as needed for mild constipation. 238 g 0   rosuvastatin (CRESTOR) 10 MG tablet Take 10 mg by mouth at bedtime.     senna-docusate (SENOKOT-S) 8.6-50 MG tablet Take 1 tablet by mouth at bedtime. 30 tablet 0   tiZANidine (ZANAFLEX) 4 MG tablet Take 1 tablet (4 mg total) by mouth 3 (three) times daily. 30 tablet 0   No current facility-administered medications for this visit.     ROS:  See HPI  Physical Exam:  Vitals:   12/01/23 0908  BP: (!) 112/57  Pulse: 68  Resp: 18  Temp: 98 F (36.7 C)  TempSrc: Temporal  SpO2: 97%  Weight: 165 lb (74.8 kg)  Height: 5\' 8"  (1.727 m)    Incision: Right groin incision well-healed; lateral fasciotomy incision pictured below with devitalized skin edges; medial fasciotomy well-appearing Extremities: Right foot is warm with brisk DP PT and peroneal signal by Doppler; all the toes of the right foot  appear somewhat dusky Neuro: A&O       Assessment/Plan:  This is a 78 y.o. male who is s/p: Right common femoral endarterectomy with bovine patch angioplasty and right leg thrombectomy including tibial thrombectomy and 4 compartment fasciotomy.  [name] is a 78 year old male who presented with acute ischemia with motor deficit of the right foot and underwent right common femoral endarterectomy with bovine patch angioplasty and right leg thrombectomy including tibial thrombectomy and 4 compartment fasciotomy.  Fasciotomies were closed during hospitalization.  Neuropathic pain in the foot is improving  however still present.  He is only 3 weeks postop thus staples and sutures were kept in place.  I encouraged him to continue to improve mobility and work with physical therapy.  Also encouraged adequate nutrition.  He will wash his fasciotomy wounds with soap and water on a daily basis then pat dry.  Right groin incision has completely healed.  He will continue his DOAC and statin daily.  He has dusky appearing toes of the right foot which likely represent distal embolization.  He however has brisk DP PT and peroneal signals by Doppler.  We will allow his toes to demarcate over the next several weeks.  He will return in 2 weeks for suture and staple removal.  He will call/return office sooner with any questions or concerns.   Manuel Rutter, [facility] [phone]  Clinic MD:  Chestine Spore

## 2023-12-02 ENCOUNTER — Other Ambulatory Visit: Payer: Self-pay | Admitting: Physician Assistant

## 2023-12-10 ENCOUNTER — Other Ambulatory Visit (HOSPITAL_COMMUNITY): Payer: Self-pay

## 2023-12-13 NOTE — Progress Notes (Unsigned)
  Electrophysiology Office Note:   Date:  12/15/2023  ID:  STANDLEY BARGO, DOB May 06, 1946, MRN [id number]  Primary Cardiologist: None Primary Heart Failure: None Electrophysiologist: None      History of Present Illness:   Manuel Winters is a 78 y.o. male with h/o HTN, HLD, DM II, CKD III seen today for post hospital follow up.    Admitted 1/16-1/28/25 with RLE pain in the setting of right popliteal arterial occlusion requiring R CFA endarterectomy with bovine patch angioplasty, RLE thrombectomy, & fasciotomy. Hospital course was complicated by AFL requiring cardizem infusion. ECHO showed LVEF 55-60%.    Since discharge from hospital the patient reports he has little appetite and feels dizzy at times. He notes his blood pressure has been low since leaving the hospital.   He denies chest pain, palpitations, dyspnea, PND, orthopnea, nausea, vomiting, dizziness, syncope, edema, weight gain, or early satiety.   Review of systems complete and found to be negative unless listed in HPI.   EP Information / Studies Reviewed:    EKG is ordered today. Personal review as below.  EKG Interpretation Date/Time:  Tuesday December 15 2023 09:56:33 EST Ventricular Rate:  66 PR Interval:  116 QRS Duration:  80 QT Interval:  400 QTC Calculation: 419 R Axis:   -14  Text Interpretation: Sinus bradycardia with Premature atrial contractions Confirmed by Canary Brim ([id number]) on 12/15/2023 1:27:01 PM   Studies:  ECHO 11/06/23 > LVEF 55-60%    Arrhythmia / AAD AFL    Risk Assessment/Calculations:    CHA2DS2-VASc Score = 5   This indicates a 7.2% annual risk of stroke. The patient's score is based upon: CHF History: 0 HTN History: 1 Diabetes History: 1 Stroke History: 0 Vascular Disease History: 1 Age Score: 2 Gender Score: 0             Physical Exam:   VS:  BP (!) 92/58   Pulse 66   Ht 5\' 8"  (1.727 m)   Wt 155 lb 12.8 oz (70.7 kg)   SpO2 96%   BMI 23.69 kg/m    Wt Readings from  Last 3 Encounters:  12/15/23 155 lb 12.8 oz (70.7 kg)  12/01/23 165 lb (74.8 kg)  11/06/23 160 lb (72.6 kg)     GEN: Well nourished, well developed in no acute distress NECK: No JVD; No carotid bruits CARDIAC: Regular rate and rhythm, no murmurs, rubs, gallops RESPIRATORY:  Clear to auscultation without rales, wheezing or rhonchi  ABDOMEN: Soft, non-tender, non-distended EXTREMITIES:  RLE with surgical sites with staples / sutures in place, healing. No edema.   ASSESSMENT AND PLAN:    Atrial Flutter  New dx 10/2023 in setting of RLE vascular event requiring thrombectomy, LVEF 55-60%, TSH / K+ normal during admit.  -metoprolol 50 mg BID  -OAC for stroke prophylaxis  -EKG with SB with PAC's  Secondary Hypercoagulable State  -continue Eliquis 5mg  BID, dose reviewed and appropriate by age / wt   Hypertension  -BP soft, reduce losartan to 25 mg daily  -he is pending a visit with his PCP on 3/14 > they are planning labs then, discussed getting a BMP as well  PAD s/p Thrombectomy, 4 Compartment Fasciotomy -follows with [facility] -crestor   CKD IIIa -per primary   Follow up with Dr. Nelly Laurence  or EP APP in 3 months  Signed, Canary Brim, NP-C, AGACNP-BC [facility] - Electrophysiology  12/15/2023, 1:27 PM

## 2023-12-15 ENCOUNTER — Encounter: Payer: Self-pay | Admitting: Physician Assistant

## 2023-12-15 ENCOUNTER — Encounter: Payer: Self-pay | Admitting: Pulmonary Disease

## 2023-12-15 ENCOUNTER — Ambulatory Visit: Payer: Medicare PPO | Attending: Pulmonary Disease | Admitting: Pulmonary Disease

## 2023-12-15 ENCOUNTER — Ambulatory Visit (INDEPENDENT_AMBULATORY_CARE_PROVIDER_SITE_OTHER): Payer: Medicare PPO | Admitting: Physician Assistant

## 2023-12-15 VITALS — BP 133/65 | HR 70 | Temp 98.1°F | Resp 18 | Ht 68.0 in | Wt 153.0 lb

## 2023-12-15 VITALS — BP 92/58 | HR 66 | Ht 68.0 in | Wt 155.8 lb

## 2023-12-15 DIAGNOSIS — I4892 Unspecified atrial flutter: Secondary | ICD-10-CM | POA: Diagnosis not present

## 2023-12-15 DIAGNOSIS — I70209 Unspecified atherosclerosis of native arteries of extremities, unspecified extremity: Secondary | ICD-10-CM

## 2023-12-15 MED ORDER — METOPROLOL TARTRATE 50 MG PO TABS: 50.0000 mg | ORAL_TABLET | Freq: Two times a day (BID) | ORAL | 3 refills | Status: AC

## 2023-12-15 MED ORDER — LOSARTAN POTASSIUM 25 MG PO TABS: 25.0000 mg | ORAL_TABLET | Freq: Every day | ORAL | 3 refills | Status: AC

## 2023-12-15 MED ORDER — APIXABAN 5 MG PO TABS: 5.0000 mg | ORAL_TABLET | Freq: Two times a day (BID) | ORAL | 3 refills | Status: AC

## 2023-12-15 NOTE — Patient Instructions (Signed)
 Medication Instructions:  Decrease losartan (Cozaar) to 25 mg daily *If you need a refill on your cardiac medications before your next appointment, please call your pharmacy*  Lab Work: Have your primary care provider draw a BMP when you see them next month If you have labs (blood work) drawn today and your tests are completely normal, you will receive your results only by: MyChart Message (if you have MyChart) OR A paper copy in the mail If you have any lab test that is abnormal or we need to change your treatment, we will call you to review the results.  Follow-Up: At [facility], you and your health needs are our priority.  As part of our continuing mission to provide you with exceptional heart care, we have created designated Provider Care Teams.  These Care Teams include your primary Cardiologist (physician) and Advanced Practice Providers (APPs -  Physician Assistants and Nurse Practitioners) who all work together to provide you with the care you need, when you need it.  Your next appointment:   3 month(s)  Provider:   York Pellant, MD

## 2023-12-15 NOTE — Progress Notes (Signed)
 POST OPERATIVE OFFICE NOTE    CC:  F/u for surgery  HPI:  This is a 78 y.o. male who is s/p right common femoral endarterectomy with bovine patch angioplasty with right leg thrombectomy involving right popliteal cutdown and tibial thrombectomy and 4 compartment fasciotomies by Dr. Chestine Spore on 11/06/2023 due to acute limb ischemia of the right lower extremity with motor deficit. Lateral fasciotomy was closed in the operating room. He subsequently underwent medial fasciotomy incision closure by Dr. Myra Gianotti on 11/10/2023. After revascularization he developed severe neuropathic pain which was maintained with gabapentin during hospitalization. He was also discharged on a DOAC.   Pt was seen on 12/01/2023 and at that time, pt pain had improved since discharge.  He had started HHPT and was ambulatory around the house with a walker.  He was only able to minimally wiggle his toes however, he had full sensation of the right foot.   Pt returns today for follow up and here with his daughter.  Pt states he has been doing well.  He is working with PT.  He is walking with his walker.  His daughter states that he has a sore on the bottom of the right great toe that has gotten smaller since the last visit in our office.    No Known Allergies  Current Outpatient Medications  Medication Sig Dispense Refill   acetaminophen (TYLENOL) 325 MG tablet Take 2 tablets (650 mg total) by mouth every 6 (six) hours as needed for mild pain (pain score 1-3). 20 tablet 0   apixaban (ELIQUIS) 5 MG TABS tablet Take 1 tablet (5 mg total) by mouth 2 (two) times daily. 180 tablet 3   chlorthalidone (HYGROTON) 25 MG tablet Take 25 mg by mouth every morning.     cholecalciferol (VITAMIN D3) 25 MCG (1000 UNIT) tablet Take 1,000 Units by mouth daily.     felodipine (PLENDIL) 10 MG 24 hr tablet Take 10 mg by mouth daily.     fexofenadine (ALLEGRA) 180 MG tablet Take by mouth.     gabapentin (NEURONTIN) 300 MG capsule Take 1 capsule (300 mg  total) by mouth 3 (three) times daily. 90 capsule 0   ibuprofen (ADVIL) 800 MG tablet Take 1 tablet by mouth every 8 (eight) hours as needed.     latanoprost (XALATAN) 0.005 % ophthalmic solution Place 1 drop into both eyes at bedtime.     losartan (COZAAR) 25 MG tablet Take 1 tablet (25 mg total) by mouth daily. 90 tablet 3   metoprolol tartrate (LOPRESSOR) 50 MG tablet Take 1 tablet (50 mg total) by mouth 2 (two) times daily. 180 tablet 3   omeprazole (PRILOSEC) 40 MG capsule Take 40 mg by mouth daily.     oxyCODONE-acetaminophen (PERCOCET/ROXICET) 5-325 MG tablet Take 1 tablet by mouth every 6 (six) hours as needed for severe pain (pain score 7-10). 20 tablet 0   polyethylene glycol powder (GLYCOLAX/MIRALAX) 17 GM/SCOOP powder Mix as directed and take 1 capful (17 g) by mouth daily as needed for mild constipation. 238 g 0   rosuvastatin (CRESTOR) 10 MG tablet Take 10 mg by mouth at bedtime.     senna-docusate (SENOKOT-S) 8.6-50 MG tablet Take 1 tablet by mouth at bedtime. 30 tablet 0   tiZANidine (ZANAFLEX) 4 MG tablet Take 1 tablet (4 mg total) by mouth 3 (three) times daily. 30 tablet 0   No current facility-administered medications for this visit.     ROS:  See HPI  Physical Exam:  Today's  Vitals   12/15/23 1503  BP: 133/65  Pulse: 70  Resp: 18  Temp: 98.1 F (36.7 C)  TempSrc: Temporal  SpO2: 97%  Weight: 153 lb (69.4 kg)  Height: 5\' 8"  (1.727 m)  PainSc: 0-No pain   Body mass index is 23.26 kg/m.   Incision:  right groin incision completely healed. Lower leg incisions as pictured:     Extremities:  + doppler flow right peroneal and PT; minimally able to dorsiflex.  Minimally able to wiggle toes.  Sore plantar aspect of right great toe     Assessment/Plan:  This is a 78 y.o. male who is s/p:  right common femoral endarterectomy with bovine patch angioplasty with right leg thrombectomy involving right popliteal cutdown and tibial thrombectomy and 4 compartment  fasciotomies by Dr. Chestine Spore on 11/06/2023 due to acute limb ischemia of the right lower extremity with motor deficit. Lateral fasciotomy was closed in the operating room. He subsequently underwent medial fasciotomy incision closure by Dr. Myra Gianotti on 11/10/2023.  -pt with + doppler flow right PT and peroneal -still with minimal motor in right foot.  Continue working with PT -all suture and staples removed today.  Dr. Chestine Spore attempted to debride lateral wound.  Instructed daughter to cleanse wounds with soap and water daily and paint lateral eschar with betadine daily.  Will have him return in 2-3 weeks with ABI on a day Dr. Chestine Spore is in clinic and check his right great toe sore.  If not improving, he may need angiogram.   -continue crestor.  Pt on Eliquis -FMLA paperwork given to his daughter   Doreatha Massed, [facility] [phone]   Clinic MD:  pt seen with Dr. Chestine Spore

## 2023-12-18 ENCOUNTER — Other Ambulatory Visit: Payer: Self-pay | Admitting: *Deleted

## 2023-12-18 DIAGNOSIS — I70209 Unspecified atherosclerosis of native arteries of extremities, unspecified extremity: Secondary | ICD-10-CM

## 2023-12-25 ENCOUNTER — Ambulatory Visit (HOSPITAL_COMMUNITY)
Admission: RE | Admit: 2023-12-25 | Discharge: 2023-12-25 | Disposition: A | Discharge status: Home / Self Care | Payer: Medicare PPO | Source: Ambulatory Visit | Attending: Vascular Surgery | Admitting: Vascular Surgery

## 2023-12-25 DIAGNOSIS — I70209 Unspecified atherosclerosis of native arteries of extremities, unspecified extremity: Secondary | ICD-10-CM | POA: Insufficient documentation

## 2023-12-25 LAB — VAS US ABI WITH/WO TBI
Left ABI: 0.64
Right ABI: 0.53

## 2023-12-30 ENCOUNTER — Other Ambulatory Visit (HOSPITAL_COMMUNITY): Payer: Self-pay

## 2024-01-05 ENCOUNTER — Ambulatory Visit (INDEPENDENT_AMBULATORY_CARE_PROVIDER_SITE_OTHER): Payer: Medicare PPO | Admitting: Physician Assistant

## 2024-01-05 VITALS — BP 148/96 | HR 66 | Temp 98.3°F | Ht 73.0 in | Wt 166.8 lb

## 2024-01-05 DIAGNOSIS — S81801S Unspecified open wound, right lower leg, sequela: Secondary | ICD-10-CM

## 2024-01-05 DIAGNOSIS — I70221 Atherosclerosis of native arteries of extremities with rest pain, right leg: Secondary | ICD-10-CM

## 2024-01-05 NOTE — Progress Notes (Unsigned)
 POST OPERATIVE OFFICE NOTE    CC:  F/u for surgery  HPI:  Manuel Winters is a 78 y.o. male who presents today for a wound check.  He recently underwent right common femoral endarterectomy with bovine pericardial patch angioplasty, right lower extremity thrombectomy including right popliteal cutdown in tibial thrombectomy, and 4 compartment fasciotomies by Dr. Chestine Spore on 11/06/2023.  This was done for acute limb ischemia of the right lower extremity with motor and sensory deficit.  His lateral fasciotomy site was closed in the operating room.  He then subsequently underwent right medial fasciotomy site closure on 11/10/2023 by Dr. Myra Gianotti.  He has regained sensation in the right foot postoperatively.  His pain level and mobility has improved, and he can wiggle his toes on the right.   He presents today for wound check.  His sutures and staples were removed from his fasciotomy sites at his last visit.  He has had a dry eschar around some of his lateral fasciotomy site that is slow to heal.  At today's visit, the patient feels like his wound has slightly gotten smaller.  Some of the eschar has fallen off and reveals some of the wound bed.  He denies any erythema or tenderness to the area.  He denies any significant drainage from the wound bed.  He also denies any fevers or chills.  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    No Known Allergies  Current Outpatient Medications  Medication Sig Dispense Refill   acetaminophen (TYLENOL) 325 MG tablet Take 2 tablets (650 mg total) by mouth every 6 (six) hours as needed for mild pain (pain score 1-3). 20 tablet 0   apixaban (ELIQUIS) 5 MG TABS tablet Take 1 tablet (5 mg total) by mouth 2 (two) times daily. 180 tablet 3   chlorthalidone (HYGROTON) 25 MG tablet Take 25 mg by mouth every morning.     cholecalciferol (VITAMIN D3) 25 MCG (1000 UNIT) tablet Take 1,000 Units by mouth daily.     felodipine (PLENDIL) 10  MG 24 hr tablet Take 10 mg by mouth daily.     fexofenadine (ALLEGRA) 180 MG tablet Take by mouth.     gabapentin (NEURONTIN) 300 MG capsule Take 1 capsule (300 mg total) by mouth 3 (three) times daily. 90 capsule 0   ibuprofen (ADVIL) 800 MG tablet Take 1 tablet by mouth every 8 (eight) hours as needed.     latanoprost (XALATAN) 0.005 % ophthalmic solution Place 1 drop into both eyes at bedtime.     losartan (COZAAR) 25 MG tablet Take 1 tablet (25 mg total) by mouth daily. 90 tablet 3   metoprolol tartrate (LOPRESSOR) 50 MG tablet Take 1 tablet (50 mg total) by mouth 2 (two) times daily. 180 tablet 3   omeprazole (PRILOSEC) 40 MG capsule Take 40 mg by mouth daily.     oxyCODONE-acetaminophen (PERCOCET/ROXICET) 5-325 MG tablet Take 1 tablet by mouth every 6 (six) hours as needed for severe pain (pain score 7-10). (Patient not taking: Reported on 12/15/2023) 20 tablet 0   polyethylene glycol powder (GLYCOLAX/MIRALAX) 17 GM/SCOOP powder Mix as directed and take 1 capful (17 g) by mouth daily as needed for mild constipation. 238 g 0   rosuvastatin (CRESTOR) 10 MG tablet Take 10 mg by mouth at bedtime.     senna-docusate (SENOKOT-S) 8.6-50 MG tablet Take 1 tablet by mouth at bedtime. (Patient not taking: Reported on 12/15/2023) 30 tablet  0   tiZANidine (ZANAFLEX) 4 MG tablet Take 1 tablet (4 mg total) by mouth 3 (three) times daily. 30 tablet 0   No current facility-administered medications for this visit.     ROS:  See HPI  Physical Exam:   Incision: Right groin and medial fasciotomy site well-healed.  Right lateral fasciotomy site with some dry eschar and open wound bed.  Dry eschar was removed by Dr. Chestine Spore with some wound bed bleeding.  No obvious signs of infection Extremities: Brisk right DP/PT/peroneal Doppler signals.  Improved color appearance to the toes.  Healed ulceration of the right great toe Neuro: Intact sensation of the right foot.  Can wiggle toes on the right    Studies: ABIs  (12/25/2023) +---------+------------------+-----+----------+--------+  Right   Rt Pressure (mmHg)IndexWaveform  Comment   +---------+------------------+-----+----------+--------+  Brachial 158                                        +---------+------------------+-----+----------+--------+  PTA     70                0.44 monophasic          +---------+------------------+-----+----------+--------+  DP      83                0.53 monophasic          +---------+------------------+-----+----------+--------+  Great Toe54                0.34 Abnormal            +---------+------------------+-----+----------+--------+     +---------+------------------+-----+--------+-------+  Left    Lt Pressure (mmHg)IndexWaveformComment  +---------+------------------+-----+--------+-------+  Brachial 145                                     +---------+------------------+-----+--------+-------+  PTA     101               0.64 biphasic         +---------+------------------+-----+--------+-------+  DP      92                0.58 biphasic         +---------+------------------+-----+--------+-------+  Great Toe108               0.68 Abnormal         +---------+------------------+-----+--------+-------+     +-------+-----------+-----------+------------+------------+  ABI/TBIToday's ABIToday's TBIPrevious ABIPrevious TBI  +-------+-----------+-----------+------------+------------+  Right 0.53       0.34       NA          NA            +-------+-----------+-----------+------------+------------+  Left  0.64       0.68       NA          NA            +-------+-----------+-----------+------------+------------+     Assessment/Plan:  This is a 78 y.o. male who is here for wound check  -The patient recently underwent extensive right lower extremity revascularization with femoral endarterectomy, right lower extremity thrombectomy and 4  compartment fasciotomies.  His staples and sutures were removed from his fasciotomy sites at his last visit -We are monitoring a wound of his right lateral fasciotomy site.  He has developed some eschar at this area.  At today's visit some of the eschar has fallen away and reveals some of the wound bed.  There are no signs of infection -The wound was also evaluated by Dr. Chestine Spore.  We believe that the patient's wound bed is getting smaller in size.  All of the eschar was removed today and there is some bleeding from the wound bed.  -ABIs demonstrate moderate arterial insufficiency bilaterally.  Right ABI 0.53 and left ABI 0.64 -Given that the patient has healed his right great toe ulceration and his lateral fasciotomy site is slowly healing, we will give him more time before considering future intervention -I have shown the family today how to perform daily wet-to-dry dressings to the lateral fasciotomy site wound bed.  -He will return to clinic in 3 to 4 weeks for repeat wound check.  The patient is aware if his wound has not made any progress, he will likely require right lower extremity angiogram   Loel Dubonnet, PA-C [facility] [phone]   Clinic MD:  Chestine Spore

## 2024-02-02 ENCOUNTER — Ambulatory Visit (INDEPENDENT_AMBULATORY_CARE_PROVIDER_SITE_OTHER): Admitting: Physician Assistant

## 2024-02-02 VITALS — BP 134/79 | HR 68 | Temp 98.0°F | Resp 18 | Ht 72.0 in | Wt 172.0 lb

## 2024-02-02 DIAGNOSIS — I70221 Atherosclerosis of native arteries of extremities with rest pain, right leg: Secondary | ICD-10-CM

## 2024-02-02 NOTE — Progress Notes (Unsigned)
 POST OPERATIVE OFFICE NOTE    CC:  F/u for surgery  HPI:  Manuel Winters is a 78 y.o. male who is here for repeat wound check. He recently underwent right common femoral endarterectomy with bovine pericardial patch angioplasty, right lower extremity thrombectomy including right popliteal cutdown in tibial thrombectomy, and 4 compartment fasciotomies by Dr. Fulton Job on 11/06/2023.  This was done for acute limb ischemia of the right lower extremity with motor and sensory deficit.  His lateral fasciotomy site was closed in the operating room.  He then subsequently underwent right medial fasciotomy site closure on 11/10/2023 by Dr. Charlotte Cookey.  He has regained sensation in the right foot postoperatively.  His pain level and mobility has improved, and he can wiggle his toes on the right.   Postoperatively the patient has had a slow to heal wound from his lateral fasciotomy site.  The patient returns today for wound check.  He believes that his wound is slowly getting smaller.  He endorses a tiny amount of serosanguineous drainage from the wound bed.  Family has been performing daily wet-to-dry dressing changes to the area.  He also continues to have moderate right lower extremity swelling since revascularization.  He denies any fevers or chills.   No Known Allergies  Current Outpatient Medications  Medication Sig Dispense Refill   acetaminophen (TYLENOL) 325 MG tablet Take 2 tablets (650 mg total) by mouth every 6 (six) hours as needed for mild pain (pain score 1-3). 20 tablet 0   apixaban (ELIQUIS) 5 MG TABS tablet Take 1 tablet (5 mg total) by mouth 2 (two) times daily. 180 tablet 3   chlorthalidone (HYGROTON) 25 MG tablet Take 25 mg by mouth every morning.     cholecalciferol (VITAMIN D3) 25 MCG (1000 UNIT) tablet Take 1,000 Units by mouth daily.     felodipine (PLENDIL) 10 MG 24 hr tablet Take 10 mg by mouth daily.     fexofenadine (ALLEGRA) 180 MG tablet Take by mouth.     gabapentin (NEURONTIN) 300  MG capsule Take 1 capsule (300 mg total) by mouth 3 (three) times daily. 90 capsule 0   ibuprofen (ADVIL) 800 MG tablet Take 1 tablet by mouth every 8 (eight) hours as needed.     latanoprost (XALATAN) 0.005 % ophthalmic solution Place 1 drop into both eyes at bedtime.     losartan (COZAAR) 25 MG tablet Take 1 tablet (25 mg total) by mouth daily. 90 tablet 3   metoprolol tartrate (LOPRESSOR) 50 MG tablet Take 1 tablet (50 mg total) by mouth 2 (two) times daily. 180 tablet 3   omeprazole (PRILOSEC) 40 MG capsule Take 40 mg by mouth daily.     oxyCODONE-acetaminophen (PERCOCET/ROXICET) 5-325 MG tablet Take 1 tablet by mouth every 6 (six) hours as needed for severe pain (pain score 7-10). 20 tablet 0   polyethylene glycol powder (GLYCOLAX/MIRALAX) 17 GM/SCOOP powder Mix as directed and take 1 capful (17 g) by mouth daily as needed for mild constipation. 238 g 0   rosuvastatin (CRESTOR) 10 MG tablet Take 10 mg by mouth at bedtime.     senna-docusate (SENOKOT-S) 8.6-50 MG tablet Take 1 tablet by mouth at bedtime. 30 tablet 0   tiZANidine (ZANAFLEX) 4 MG tablet Take 1 tablet (4 mg total) by mouth 3 (three) times daily. 30 tablet 0   No current facility-administered medications for this visit.     ROS:  See HPI  Physical Exam:  Extremities: Brisk right DP/PT/peroneal Doppler signals.  Slowly  healing right lateral lower leg wound with healthy granulation tissue and some hypergranulation tissue Neuro: Intact sensation of the right foot.  Can wiggle his toes on the right      Assessment/Plan:  This is a 78 y.o. male who is here for wound check  -The patient's right lower extremity continues to improve after recent revascularization.  He has brisk DP/PT/peroneal Doppler signals in the right foot -His right lateral lower leg wound continues to heal slowly.  The tissue within the wound bed appears much healthier today with some hypergranulation tissue.  He endorses some serosanguineous drainage from  this area.  He denies any puslike drainage, fever, or chills. - I have treated some of the hypergranulation tissue in the wound bed with silver nitrate.  I have encouraged the patient to continue daily wet-to-dry dressing changes to this area.  The patient's family is requesting help with dressing changes, so I will place orders for home health nursing. - He can follow-up with our office in 4 weeks for repeat wound check   Deneise Finlay, PA-C [facility] [phone]   Clinic MD:  Fulton Job

## 2024-02-03 ENCOUNTER — Other Ambulatory Visit: Payer: Self-pay

## 2024-02-03 DIAGNOSIS — S81801S Unspecified open wound, right lower leg, sequela: Secondary | ICD-10-CM

## 2024-02-05 ENCOUNTER — Other Ambulatory Visit: Payer: Self-pay

## 2024-02-05 DIAGNOSIS — S81801S Unspecified open wound, right lower leg, sequela: Secondary | ICD-10-CM

## 2024-02-05 DIAGNOSIS — I70221 Atherosclerosis of native arteries of extremities with rest pain, right leg: Secondary | ICD-10-CM

## 2024-02-06 ENCOUNTER — Other Ambulatory Visit (HOSPITAL_COMMUNITY): Payer: Self-pay

## 2024-03-01 ENCOUNTER — Ambulatory Visit: Attending: Vascular Surgery | Admitting: Physician Assistant

## 2024-03-01 VITALS — BP 139/72 | HR 53 | Temp 98.3°F | Wt 173.8 lb

## 2024-03-01 DIAGNOSIS — S81801S Unspecified open wound, right lower leg, sequela: Secondary | ICD-10-CM

## 2024-03-01 DIAGNOSIS — I70221 Atherosclerosis of native arteries of extremities with rest pain, right leg: Secondary | ICD-10-CM

## 2024-03-04 ENCOUNTER — Other Ambulatory Visit: Payer: Self-pay | Admitting: *Deleted

## 2024-03-04 DIAGNOSIS — I70209 Unspecified atherosclerosis of native arteries of extremities, unspecified extremity: Secondary | ICD-10-CM

## 2024-03-04 DIAGNOSIS — I70221 Atherosclerosis of native arteries of extremities with rest pain, right leg: Secondary | ICD-10-CM

## 2024-03-08 NOTE — Progress Notes (Signed)
 Office Note   History of Present Illness   Manuel Winters is a 78 y.o. male who is here for repeat wound check. He recently underwent right common femoral endarterectomy with bovine pericardial patch angioplasty, right lower extremity thrombectomy including right popliteal cutdown in tibial thrombectomy, and 4 compartment fasciotomies by Dr. Fulton Job on 11/06/2023.  This was done for acute limb ischemia of the right lower extremity with motor and sensory deficit.  His lateral fasciotomy site was closed in the operating room.  He then subsequently underwent right medial fasciotomy site closure on 11/10/2023 by Dr. Charlotte Cookey.  He has regained sensation in the right foot postoperatively.  His pain level and mobility has improved, and he can wiggle his toes on the right. Postoperatively we have also been following a slow to heal lateral fasciotomy site.  He returns today for follow up. He is doing well at today's visit. He says his right foot continues to feel better. He still gets shooting pains across the top sometimes. He also believes his right leg wound is nearly healed.   Current Outpatient Medications  Medication Sig Dispense Refill   acetaminophen  (TYLENOL ) 325 MG tablet Take 2 tablets (650 mg total) by mouth every 6 (six) hours as needed for mild pain (pain score 1-3). 20 tablet 0   apixaban  (ELIQUIS ) 5 MG TABS tablet Take 1 tablet (5 mg total) by mouth 2 (two) times daily. 180 tablet 3   chlorthalidone (HYGROTON) 25 MG tablet Take 25 mg by mouth every morning.     cholecalciferol (VITAMIN D3) 25 MCG (1000 UNIT) tablet Take 1,000 Units by mouth daily.     felodipine (PLENDIL) 10 MG 24 hr tablet Take 10 mg by mouth daily.     fexofenadine (ALLEGRA) 180 MG tablet Take by mouth.     gabapentin  (NEURONTIN ) 300 MG capsule Take 1 capsule (300 mg total) by mouth 3 (three) times daily. 90 capsule 0   ibuprofen  (ADVIL ) 800 MG tablet Take 1 tablet by mouth every 8 (eight) hours as needed.     latanoprost   (XALATAN ) 0.005 % ophthalmic solution Place 1 drop into both eyes at bedtime.     losartan  (COZAAR ) 25 MG tablet Take 1 tablet (25 mg total) by mouth daily. 90 tablet 3   metoprolol  tartrate (LOPRESSOR ) 50 MG tablet Take 1 tablet (50 mg total) by mouth 2 (two) times daily. 180 tablet 3   omeprazole (PRILOSEC) 40 MG capsule Take 40 mg by mouth daily.     oxyCODONE -acetaminophen  (PERCOCET/ROXICET) 5-325 MG tablet Take 1 tablet by mouth every 6 (six) hours as needed for severe pain (pain score 7-10). 20 tablet 0   polyethylene glycol powder (GLYCOLAX /MIRALAX ) 17 GM/SCOOP powder Mix as directed and take 1 capful (17 g) by mouth daily as needed for mild constipation. 238 g 0   rosuvastatin  (CRESTOR ) 10 MG tablet Take 10 mg by mouth at bedtime.     senna-docusate (SENOKOT-S) 8.6-50 MG tablet Take 1 tablet by mouth at bedtime. 30 tablet 0   tiZANidine  (ZANAFLEX ) 4 MG tablet Take 1 tablet (4 mg total) by mouth 3 (three) times daily. 30 tablet 0   No current facility-administered medications for this visit.    REVIEW OF SYSTEMS (negative unless checked):   Cardiac:  []  Chest pain or chest pressure? []  Shortness of breath upon activity? []  Shortness of breath when lying flat? []  Irregular heart rhythm?  Vascular:  []  Pain in calf, thigh, or hip brought on by walking? []  Pain  in feet at night that wakes you up from your sleep? []  Blood clot in your veins? []  Leg swelling?  Pulmonary:  []  Oxygen at home? []  Productive cough? []  Wheezing?  Neurologic:  []  Sudden weakness in arms or legs? []  Sudden numbness in arms or legs? []  Sudden onset of difficult speaking or slurred speech? []  Temporary loss of vision in one eye? []  Problems with dizziness?  Gastrointestinal:  []  Blood in stool? []  Vomited blood?  Genitourinary:  []  Burning when urinating? []  Blood in urine?  Psychiatric:  []  Major depression  Hematologic:  []  Bleeding problems? []  Problems with blood  clotting?  Dermatologic:  []  Rashes or ulcers?  Constitutional:  []  Fever or chills?  Ear/Nose/Throat:  []  Change in hearing? []  Nose bleeds? []  Sore throat?  Musculoskeletal:  []  Back pain? []  Joint pain? []  Muscle pain?   Physical Examination    Vitals:   03/01/24 1412  BP: 139/72  Pulse: (!) 53  Temp: 98.3 F (36.8 C)  TempSrc: Temporal  SpO2: 98%  Weight: 173 lb 12.8 oz (78.8 kg)   Body mass index is 23.57 kg/m.  General:  WDWN in NAD; vital signs documented above Gait: Not observed HENT: WNL, normocephalic Pulmonary: normal non-labored breathing  Cardiac: regular Abdomen: soft, NT, no masses Skin: without rashes Vascular Exam/Pulses: brisk right DP/PT/Peroneal doppler signals Extremities: nearly healed right lateral lower leg wound Musculoskeletal: no muscle wasting or atrophy  Neurologic: A&O X 3;  No focal weakness or paresthesias are detected Psychiatric:  The pt has Normal affect.      Medical Decision Making   MATTIX IMHOF is a 78 y.o. male who presents for wound check  The patient recently underwent extensive right lower extremity revascularization with femoral endarterectomy, right lower extremity thrombectomy and 4 compartment fasciotomies. We have been closely following a slow to heal wound from his lateral fasciotomy site On exam his right lateral lower leg wound is nearly healed. There is a small amount of scabbing left His right leg remains well perfused with brisk DP/PT/Peroneal doppler signals. He denies any claudication, rest pain, or tissue loss He can follow up with our office in 1-2 months with ABIs and RLE arterial duplex   Deneise Finlay PA-C [facility] Office: [phone]  Clinic MD: Marcelino Sera

## 2024-03-09 ENCOUNTER — Ambulatory Visit: Payer: Medicare PPO | Admitting: Cardiovascular Disease

## 2024-03-21 ENCOUNTER — Encounter: Payer: Self-pay | Admitting: Cardiovascular Disease

## 2024-03-21 ENCOUNTER — Ambulatory Visit: Attending: Cardiovascular Disease | Admitting: Cardiovascular Disease

## 2024-03-21 VITALS — BP 116/78 | HR 58 | Ht 72.0 in | Wt 173.0 lb

## 2024-03-21 DIAGNOSIS — I4892 Unspecified atrial flutter: Secondary | ICD-10-CM | POA: Diagnosis not present

## 2024-03-21 NOTE — Progress Notes (Signed)
  Electrophysiology Office Note:    Date:  03/21/2024   ID:  Manuel Winters, DOB 18-Jun-1946, MRN [id number]  PCP:  Annie Barton, MD   [facility] Providers Cardiologist:  None     Referring MD: Annie Barton, MD   History of Present Illness:    Manuel Winters is a 78 y.o. male with a medical history significant for hypertension, dyslipidemia, type 2 diabetes, CKD 3, who presents for EP follow-up.      Discussed the use of AI scribe software for clinical note transcription with the patient, who gave verbal consent to proceed.  History of Present Illness Manuel Winters is a 78 year old male with a history of atrial flutter who presents for follow-up after recent hospitalization for right palpatine artery occlusion.  He was hospitalized from January 16th to January 28th, 2025, for a right palpatine artery occlusion, which required an interdirectomy and bovine patch angioplasty, as well as a lower extremity thrombectomy and fasciotomy. During this hospitalization, he developed atrial flutter, which was managed with a diltiazem  infusion.  Since the procedures, his legs have been doing well. He does not recall experiencing palpitations during the atrial flutter episode in the hospital and has not had any similar episodes since discharge. No current symptoms of shortness of breath or chest pain, and he describes his recovery as 'so far so good'.  No recurrence of the heart rhythm issue since hospital discharge.         Today, he reports that he is doing well and has no complaints other than his leg is still recovering.  EKGs/Labs/Other Studies Reviewed Today:     Echocardiogram:  TTE January 2025 LVEF 55 to 60%.     EKG:   EKG Interpretation Date/Time:  Monday March 21 2024 10:42:15 EDT Ventricular Rate:  58 PR Interval:  214 QRS Duration:  84 QT Interval:  410 QTC Calculation: 402 R Axis:   -51  Text Interpretation: Sinus bradycardia with 1st degree A-V block  Left axis deviation When compared with ECG of 15-Dec-2023 09:56, P wave morphology has changed Confirmed by Marlane Silver [phone]) on 03/21/2024 10:49:02 AM     Physical Exam:    VS:  BP 116/78 (BP Location: Right Arm, Patient Position: Sitting, Cuff Size: Normal)   Pulse (!) 58   Ht 6' (1.829 m)   Wt 173 lb (78.5 kg)   SpO2 99%   BMI 23.46 kg/m     Wt Readings from Last 3 Encounters:  03/21/24 173 lb (78.5 kg)  03/01/24 173 lb 12.8 oz (78.8 kg)  02/02/24 172 lb (78 kg)     GEN: Well nourished, well developed in no acute distress CARDIAC: RRR, no murmurs, rubs, gallops RESPIRATORY:  Normal work of breathing MUSCULOSKELETAL: no edema    ASSESSMENT & PLAN:     Atrial flutter Diagnosed in January 2025 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  PAD S/p thrombectomy and fasciotomy    Signed, Efraim Grange, MD  03/21/2024 10:54 AM    [facility]

## 2024-03-21 NOTE — Patient Instructions (Signed)
 Medication Instructions:  Your physician recommends that you continue on your current medications as directed. Please refer to the Current Medication list given to you today. *If you need a refill on your cardiac medications before your next appointment, please call your pharmacy*   Follow-Up: At [facility], you and your health needs are our priority.  As part of our continuing mission to provide you with exceptional heart care, our providers are all part of one team.  This team includes your primary Cardiologist (physician) and Advanced Practice Providers or APPs (Physician Assistants and Nurse Practitioners) who all work together to provide you with the care you need, when you need it.  Your next appointment:   1 year(s)  Provider:   You will see one of the following Advanced Practice Providers on your designated Care Team:   Mertha Abrahams, Kennard Pea [address]" [place], PA-C Suzann Riddle, NP Creighton Doffing, NP

## 2024-03-30 ENCOUNTER — Ambulatory Visit: Admitting: Cardiovascular Disease

## 2024-05-03 ENCOUNTER — Ambulatory Visit (INDEPENDENT_AMBULATORY_CARE_PROVIDER_SITE_OTHER)

## 2024-05-03 ENCOUNTER — Encounter: Payer: Self-pay | Admitting: Physician Assistant

## 2024-05-03 ENCOUNTER — Ambulatory Visit (HOSPITAL_COMMUNITY)
Admission: RE | Admit: 2024-05-03 | Discharge: 2024-05-03 | Disposition: A | Discharge status: Home / Self Care | Source: Ambulatory Visit | Attending: Vascular Surgery | Admitting: Vascular Surgery

## 2024-05-03 ENCOUNTER — Ambulatory Visit (HOSPITAL_BASED_OUTPATIENT_CLINIC_OR_DEPARTMENT_OTHER)
Admission: RE | Admit: 2024-05-03 | Discharge: 2024-05-03 | Disposition: A | Discharge status: Home / Self Care | Source: Ambulatory Visit | Attending: Vascular Surgery | Admitting: Vascular Surgery

## 2024-05-03 VITALS — BP 134/71 | Temp 97.7°F | Resp 18 | Ht 72.0 in | Wt 178.1 lb

## 2024-05-03 DIAGNOSIS — I70209 Unspecified atherosclerosis of native arteries of extremities, unspecified extremity: Secondary | ICD-10-CM | POA: Insufficient documentation

## 2024-05-03 DIAGNOSIS — I70221 Atherosclerosis of native arteries of extremities with rest pain, right leg: Secondary | ICD-10-CM

## 2024-05-03 LAB — VAS US ABI WITH/WO TBI
Left ABI: 0.55
Right ABI: 0.43

## 2024-05-03 NOTE — Progress Notes (Signed)
 HISTORY AND PHYSICAL     CC:  follow up. Requesting Provider:  Waylan Maxwell, MD  HPI: This is a 78 y.o. male who is here today for follow up for PAD.  Pt has hx of  right common femoral endarterectomy with bovine patch angioplasty with right leg thrombectomy involving right popliteal cutdown and tibial thrombectomy and 4 compartment fasciotomies by Dr. Gretta on 11/06/2023 due to acute limb ischemia of the right lower extremity with motor deficit. Lateral fasciotomy was closed in the operating room. He subsequently underwent medial fasciotomy incision closure by Dr. Serene on 11/10/2023. After revascularization he developed severe neuropathic pain which was maintained with gabapentin  during hospitalization. He was also discharged on a DOAC.   Pt was last seen 03/01/2024 and at that time, he was doing well and felt his legs were healing and getting better.    The pt returns today for follow up with his daughter.  He states he is not having any trouble with his right leg.  He is able to mow the yard.  He has continued his home therapy.  He denies any claudication, rest pain or non healing wounds.  He is very appreciative for his care and Dr. Gretta.   The pt is on a statin for cholesterol management.    The pt is not on an aspirin.    Other AC:  Eliquis  The pt is on ARB, BB for hypertension.  The pt is not on diabetic medication. Tobacco hx:  never  Pt does not have family hx of AAA.  Past Medical History:  Diagnosis Date   Diabetes mellitus without complication (HCC)    Hypertension     Past Surgical History:  Procedure Laterality Date   ENDARTERECTOMY FEMORAL  11/06/2023   Procedure: ENDARTERECTOMY RIGHT COMMON FEMORAL ARTERY WITH BOVINE PATCH;  Surgeon: Gretta Lonni PARAS, MD;  Location: [facility] OR;  Service: Vascular;;   FASCIOTOMY CLOSURE Right 11/10/2023   Procedure: FASCIOTOMY CLOSURE RIGHT LEG;  Surgeon: Serene Gaile ORN, MD;  Location: [facility] OR;  Service: Vascular;  Laterality: Right;    THIGH FASCIOTOMY Right 11/06/2023   Procedure: FASCIOTOMY;  Surgeon: Gretta Lonni PARAS, MD;  Location: [facility] OR;  Service: Vascular;  Laterality: Right;   THROMBECTOMY FEMORAL ARTERY Right 11/06/2023   Procedure: RIGHT THROMBECTOMY FEMORAL ARTERY;  Surgeon: Gretta Lonni PARAS, MD;  Location: [facility] OR;  Service: Vascular;  Laterality: Right;    No Known Allergies  Current Outpatient Medications  Medication Sig Dispense Refill   acetaminophen  (TYLENOL ) 325 MG tablet Take 2 tablets (650 mg total) by mouth every 6 (six) hours as needed for mild pain (pain score 1-3). 20 tablet 0   apixaban  (ELIQUIS ) 5 MG TABS tablet Take 1 tablet (5 mg total) by mouth 2 (two) times daily. 180 tablet 3   chlorthalidone (HYGROTON) 25 MG tablet Take 25 mg by mouth every morning.     cholecalciferol (VITAMIN D3) 25 MCG (1000 UNIT) tablet Take 1,000 Units by mouth daily.     felodipine (PLENDIL) 10 MG 24 hr tablet Take 10 mg by mouth daily.     fexofenadine (ALLEGRA) 180 MG tablet Take by mouth.     gabapentin  (NEURONTIN ) 300 MG capsule Take 1 capsule (300 mg total) by mouth 3 (three) times daily. 90 capsule 0   ibuprofen  (ADVIL ) 800 MG tablet Take 1 tablet by mouth every 8 (eight) hours as needed.     latanoprost  (XALATAN ) 0.005 % ophthalmic solution Place 1 drop into both eyes at bedtime.  losartan  (COZAAR ) 25 MG tablet Take 1 tablet (25 mg total) by mouth daily. 90 tablet 3   metoprolol  tartrate (LOPRESSOR ) 50 MG tablet Take 1 tablet (50 mg total) by mouth 2 (two) times daily. 180 tablet 3   omeprazole (PRILOSEC) 40 MG capsule Take 40 mg by mouth daily.     oxyCODONE -acetaminophen  (PERCOCET/ROXICET) 5-325 MG tablet Take 1 tablet by mouth every 6 (six) hours as needed for severe pain (pain score 7-10). 20 tablet 0   polyethylene glycol powder (GLYCOLAX /MIRALAX ) 17 GM/SCOOP powder Mix as directed and take 1 capful (17 g) by mouth daily as needed for mild constipation. 238 g 0   rosuvastatin  (CRESTOR ) 10 MG tablet  Take 10 mg by mouth at bedtime.     senna-docusate (SENOKOT-S) 8.6-50 MG tablet Take 1 tablet by mouth at bedtime. 30 tablet 0   tiZANidine  (ZANAFLEX ) 4 MG tablet Take 1 tablet (4 mg total) by mouth 3 (three) times daily. 30 tablet 0   No current facility-administered medications for this visit.    No family history on file.  Social History   Socioeconomic History   Marital status: Married    Spouse name: Not on file   Number of children: Not on file   Years of education: Not on file   Highest education level: Not on file  Occupational History   Not on file  Tobacco Use   Smoking status: Never   Smokeless tobacco: Never  Vaping Use   Vaping status: Never Used  Substance and Sexual Activity   Alcohol use: Yes    Comment: occasonal   Drug use: Never   Sexual activity: Not Currently  Other Topics Concern   Not on file  Social History Narrative   Not on file   Social Drivers of Health   Financial Resource Strain: Low Risk  (12/11/2023)   Received from [facility]   Overall Financial Resource Strain (CARDIA)    Difficulty of Paying Living Expenses: Not hard at all  Food Insecurity: No Food Insecurity (01/01/2024)   Received from [facility]   Hunger Vital Sign    Within the past 12 months, you worried that your food would run out before you got the money to buy more.: Never true    Within the past 12 months, the food you bought just didn't last and you didn't have money to get more.: Never true  Transportation Needs: No Transportation Needs (01/01/2024)   Received from [facility]   PRAPARE - Transportation    Lack of Transportation (Medical): No    Lack of Transportation (Non-Medical): No  Physical Activity: Sufficiently Active (12/11/2023)   Received from [facility]   Exercise Vital Sign    On average, how many days per week do you engage in moderate to strenuous exercise (like a brisk walk)?: 7 days    On average, how many minutes do you engage in  exercise at this level?: 30 min  Stress: No Stress Concern Present (12/11/2023)   Received from [facility] - Occupational Stress Questionnaire    Feeling of Stress : Not at all  Social Connections: Socially Integrated (12/11/2023)   Received from [facility]   Social Connection and Isolation Panel    In a typical week, how many times do you talk on the phone with family, friends, or neighbors?: More than three times a week    How often do you get together  with friends or relatives?: More than three times a week    How often do you attend church or religious services?: More than 4 times per year    Do you belong to any clubs or organizations such as church groups, unions, fraternal or athletic groups, or school groups?: Yes    How often do you attend meetings of the clubs or organizations you belong to?: Never    Are you married, widowed, divorced, separated, never married, or living with a partner?: Married  Intimate Partner Violence: Not At Risk (12/11/2023)   Received from [facility]   Humiliation, Afraid, Rape, and Kick questionnaire    Within the last year, have you been afraid of your partner or ex-partner?: No    Within the last year, have you been humiliated or emotionally abused in other ways by your partner or ex-partner?: No    Within the last year, have you been kicked, hit, slapped, or otherwise physically hurt by your partner or ex-partner?: No    Within the last year, have you been raped or forced to have any kind of sexual activity by your partner or ex-partner?: No     REVIEW OF SYSTEMS:   [X]  denotes positive finding, [ ]  denotes negative finding Cardiac  Comments:  Chest pain or chest pressure:    Shortness of breath upon exertion:    Short of breath when lying flat:    Irregular heart rhythm:        Vascular    Pain in calf, thigh, or hip brought on by ambulation:    Pain in feet at night that wakes you up  from your sleep:     Blood clot in your veins:    Leg swelling:         Pulmonary    Oxygen at home:    Productive cough:     Wheezing:         Neurologic    Sudden weakness in arms or legs:     Sudden numbness in arms or legs:     Sudden onset of difficulty speaking or slurred speech:    Temporary loss of vision in one eye:     Problems with dizziness:         Gastrointestinal    Blood in stool:     Vomited blood:         Genitourinary    Burning when urinating:     Blood in urine:        Psychiatric    Major depression:         Hematologic    Bleeding problems:    Problems with blood clotting too easily:        Skin    Rashes or ulcers:        Constitutional    Fever or chills:      PHYSICAL EXAMINATION:  Today's Vitals   05/03/24 1043  BP: 134/71  Resp: 18  Temp: 97.7 F (36.5 C)  TempSrc: Oral  Weight: 178 lb 1.6 oz (80.8 kg)  Height: 6' (1.829 m)  PainSc: 0-No pain   Body mass index is 24.15 kg/m.   General:  WDWN in NAD; vital signs documented above Gait: Not observed HENT: WNL, normocephalic Pulmonary: normal non-labored breathing , without wheezing Cardiac: regular HR, without carotid bruits Abdomen: soft, NT; aortic pulse is not palpable Skin: without rashes; well healed incisions on RLE  Vascular Exam/Pulses: Monophasic brisk doppler flow bilateral DP/PT/peroneal Extremities: without  ischemic changes, without Gangrene , without cellulitis; without open wounds Musculoskeletal: no muscle wasting or atrophy  Neurologic: A&O X 3 Psychiatric:  The pt has Normal affect.   Non-Invasive Vascular Imaging:   ABI's/TBI's on 05/03/2024: Right:  0.43/0.21 - Great toe pressure: 33 Left:  0.55/0.54 - Great toe pressure:  86   Arterial duplex on 05/03/2024: +-----------+--------+-----+---------------+----------+--------+  RIGHT     PSV cm/sRatioStenosis       Waveform  Comments   +-----------+--------+-----+---------------+----------+--------+  EIA Distal 92                          monophasic          +-----------+--------+-----+---------------+----------+--------+  CFA Distal 91                          monophasic          +-----------+--------+-----+---------------+----------+--------+  DFA       67                          monophasic          +-----------+--------+-----+---------------+----------+--------+  SFA Prox   98           50-74% stenosismonophasic          +-----------+--------+-----+---------------+----------+--------+  SFA Mid    90                          monophasic          +-----------+--------+-----+---------------+----------+--------+  SFA Distal 83                                              +-----------+--------+-----+---------------+----------+--------+  POP Prox   76                          monophasic          +-----------+--------+-----+---------------+----------+--------+  POP Distal 29                          monophasic          +-----------+--------+-----+---------------+----------+--------+  TP Trunk                occluded                           +-----------+--------+-----+---------------+----------+--------+  ATA Distal 53                          monophasic          +-----------+--------+-----+---------------+----------+--------+  PTA Prox   40                          monophasic          +-----------+--------+-----+---------------+----------+--------+  PTA Mid    28                          monophasic          +-----------+--------+-----+---------------+----------+--------+  PTA Distal 62  monophasic          +-----------+--------+-----+---------------+----------+--------+  PERO Distal45                          monophasic          +-----------+--------+-----+---------------+----------+--------+    Summary:  Right: 50-74% stenosis noted in the superficial femoral artery.   Previous ABI's/TBI's on 05/03/2024: Right:  0.53/0.34 - Great toe pressure: 54 Left:  0.64/0.68 - Great toe pressure: 108    ASSESSMENT/PLAN:: 78 y.o. male here for follow up for PAD with hx of  right common femoral endarterectomy with bovine patch angioplasty with right leg thrombectomy involving right popliteal cutdown and tibial thrombectomy and 4 compartment fasciotomies by Dr. Gretta on 11/06/2023 due to acute limb ischemia of the right lower extremity with motor deficit. Lateral fasciotomy was closed in the operating room. He subsequently underwent medial fasciotomy incision closure by Dr. Serene on 11/10/2023. After revascularization he developed severe neuropathic pain which was maintained with gabapentin  during hospitalization. He was also discharged on a DOAC.    -pt doing well without claudication, rest pain or non healing wounds.  He has brisk monophasic doppler flow bilateral feet.  -continue statin.  Pt also on Eliquis  -discussed importance of increased walking daily and continuing his HH exercises.   -fortunately, he is not smoking.  -pt will f/u in 6 months with Dr. Gretta as he would like to see him when he returns.  Pt and his daughter know to call sooner if he develops any non healing wounds or rest pain and they expressed good understanding.     Lucie Apt, [facility] [phone]  Clinic MD:   Magda

## 2024-05-04 ENCOUNTER — Other Ambulatory Visit: Payer: Self-pay

## 2024-05-04 DIAGNOSIS — I70213 Atherosclerosis of native arteries of extremities with intermittent claudication, bilateral legs: Secondary | ICD-10-CM

## 2024-05-30 ENCOUNTER — Encounter (HOSPITAL_COMMUNITY): Payer: Self-pay

## 2024-05-30 ENCOUNTER — Emergency Department (HOSPITAL_COMMUNITY)
Admission: EM | Admit: 2024-05-30 | Discharge: 2024-05-30 | Disposition: A | Discharge status: Home / Self Care | Attending: Emergency Medicine | Admitting: Emergency Medicine

## 2024-05-30 ENCOUNTER — Emergency Department (HOSPITAL_COMMUNITY)

## 2024-05-30 DIAGNOSIS — E1122 Type 2 diabetes mellitus with diabetic chronic kidney disease: Secondary | ICD-10-CM | POA: Insufficient documentation

## 2024-05-30 DIAGNOSIS — E876 Hypokalemia: Secondary | ICD-10-CM | POA: Insufficient documentation

## 2024-05-30 DIAGNOSIS — Z79899 Other long term (current) drug therapy: Secondary | ICD-10-CM | POA: Diagnosis not present

## 2024-05-30 DIAGNOSIS — Z7901 Long term (current) use of anticoagulants: Secondary | ICD-10-CM | POA: Diagnosis not present

## 2024-05-30 DIAGNOSIS — N189 Chronic kidney disease, unspecified: Secondary | ICD-10-CM | POA: Insufficient documentation

## 2024-05-30 DIAGNOSIS — I129 Hypertensive chronic kidney disease with stage 1 through stage 4 chronic kidney disease, or unspecified chronic kidney disease: Secondary | ICD-10-CM | POA: Insufficient documentation

## 2024-05-30 DIAGNOSIS — R112 Nausea with vomiting, unspecified: Secondary | ICD-10-CM | POA: Diagnosis present

## 2024-05-30 DIAGNOSIS — E860 Dehydration: Secondary | ICD-10-CM | POA: Diagnosis not present

## 2024-05-30 LAB — CBC WITH DIFFERENTIAL/PLATELET
Abs Immature Granulocytes: 0.02 K/uL (ref 0.00–0.07)
Basophils Absolute: 0 K/uL (ref 0.0–0.1)
Basophils Relative: 0 %
Eosinophils Absolute: 0 K/uL (ref 0.0–0.5)
Eosinophils Relative: 0 %
HCT: 37.4 % — ABNORMAL LOW (ref 39.0–52.0)
Hemoglobin: 12.5 g/dL — ABNORMAL LOW (ref 13.0–17.0)
Immature Granulocytes: 0 %
Lymphocytes Relative: 23 %
Lymphs Abs: 1.7 K/uL (ref 0.7–4.0)
MCH: 30 pg (ref 26.0–34.0)
MCHC: 33.4 g/dL (ref 30.0–36.0)
MCV: 89.9 fL (ref 80.0–100.0)
Monocytes Absolute: 0.5 K/uL (ref 0.1–1.0)
Monocytes Relative: 7 %
Neutro Abs: 4.9 K/uL (ref 1.7–7.7)
Neutrophils Relative %: 70 %
Platelets: 248 K/uL (ref 150–400)
RBC: 4.16 MIL/uL — ABNORMAL LOW (ref 4.22–5.81)
RDW: 14.9 % (ref 11.5–15.5)
WBC: 7.1 K/uL (ref 4.0–10.5)
nRBC: 0 % (ref 0.0–0.2)

## 2024-05-30 LAB — URINALYSIS, ROUTINE W REFLEX MICROSCOPIC
Bacteria, UA: NONE SEEN
Bilirubin Urine: NEGATIVE
Glucose, UA: NEGATIVE mg/dL
Ketones, ur: NEGATIVE mg/dL
Leukocytes,Ua: NEGATIVE
Nitrite: NEGATIVE
Protein, ur: NEGATIVE mg/dL
Specific Gravity, Urine: 1.027 (ref 1.005–1.030)
pH: 5 (ref 5.0–8.0)

## 2024-05-30 LAB — COMPREHENSIVE METABOLIC PANEL WITH GFR
ALT: 12 U/L (ref 0–44)
AST: 32 U/L (ref 15–41)
Albumin: 4.3 g/dL (ref 3.5–5.0)
Alkaline Phosphatase: 68 U/L (ref 38–126)
Anion gap: 12 (ref 5–15)
BUN: 12 mg/dL (ref 8–23)
CO2: 23 mmol/L (ref 22–32)
Calcium: 10.3 mg/dL (ref 8.9–10.3)
Chloride: 102 mmol/L (ref 98–111)
Creatinine, Ser: 1.31 mg/dL — ABNORMAL HIGH (ref 0.61–1.24)
GFR, Estimated: 55 mL/min — ABNORMAL LOW (ref >60)
Glucose, Bld: 126 mg/dL — ABNORMAL HIGH (ref 70–99)
Potassium: 3.3 mmol/L — ABNORMAL LOW (ref 3.5–5.1)
Sodium: 137 mmol/L (ref 135–145)
Total Bilirubin: 1.1 mg/dL (ref 0.0–1.2)
Total Protein: 9.5 g/dL — ABNORMAL HIGH (ref 6.5–8.1)

## 2024-05-30 LAB — TYPE AND SCREEN
ABO/RH(D): O POS
Antibody Screen: NEGATIVE

## 2024-05-30 LAB — MAGNESIUM: Magnesium: 1.7 mg/dL (ref 1.7–2.4)

## 2024-05-30 LAB — POC OCCULT BLOOD, ED: Fecal Occult Blood: NEGATIVE

## 2024-05-30 MED ORDER — SODIUM CHLORIDE 0.9 % IV BOLUS
500.0000 mL | Freq: Once | INTRAVENOUS | Status: AC
  Administered 2024-05-30 (×2): 500 mL via INTRAVENOUS

## 2024-05-30 MED ORDER — IOHEXOL 350 MG/ML SOLN
80.0000 mL | Freq: Once | INTRAVENOUS | Status: AC | PRN
  Administered 2024-05-30 (×2): 80 mL via INTRAVENOUS

## 2024-05-30 MED ORDER — ONDANSETRON 4 MG PO TBDP: 4.0000 mg | ORAL_TABLET | Freq: Three times a day (TID) | ORAL | 0 refills | Status: AC | PRN

## 2024-05-30 NOTE — ED Triage Notes (Addendum)
 Pt comes in for diarrhea (stool is black) and lack of apptite and feeling generally unwell. Pt had same s/s a few weeks ago and did not seek medical attention. S/s did resolve. Dark stool start yesterday. Pt has not been able to eat regularly for the past week. A&Ox4. Pt states when he eats sometimes he throws it up.   *hard of hearing

## 2024-05-30 NOTE — ED Provider Notes (Signed)
 [place] EMERGENCY DEPARTMENT AT [facility] Provider Note   CSN: [id number] Arrival date & time: 05/30/24  1058     Patient presents with: Diarrhea, Weakness, and Nausea   Manuel Winters is a 78 y.o. male.   Pt is a 78 yo male with a pmhx significant for htn, pvd, dm, atrial flutter (on Eliquis ) and CKD.  Pt has not been feeling well for the past few days.  He's had weakness and nausea.  He's been taking Zofran  which has been helping.  He had dark stool yesterday, but that has resolved.  No fevers. No abd pain now, but he had it earlier.  He's not had much appetite.       Prior to Admission medications   Medication Sig Start Date End Date Taking? Authorizing Provider  ondansetron  (ZOFRAN -ODT) 4 MG disintegrating tablet Take 1 tablet (4 mg total) by mouth every 8 (eight) hours as needed. 05/30/24  Yes Dean Clarity, MD  acetaminophen  (TYLENOL ) 325 MG tablet Take 2 tablets (650 mg total) by mouth every 6 (six) hours as needed for mild pain (pain score 1-3). 11/17/23   Sherrill Cable Latif, DO  apixaban  (ELIQUIS ) 5 MG TABS tablet Take 1 tablet (5 mg total) by mouth 2 (two) times daily. 12/15/23   Aniceto Daphne CROME, NP  chlorthalidone (HYGROTON) 25 MG tablet Take 25 mg by mouth every morning.    [provider]  cholecalciferol (VITAMIN D3) 25 MCG (1000 UNIT) tablet Take 1,000 Units by mouth daily.    [provider]  felodipine (PLENDIL) 10 MG 24 hr tablet Take 10 mg by mouth daily.    [provider]  fexofenadine (ALLEGRA) 180 MG tablet Take by mouth. 12/04/23   [provider]  gabapentin  (NEURONTIN ) 300 MG capsule Take 1 capsule (300 mg total) by mouth 3 (three) times daily. 11/17/23   Sherrill Cable Latif, DO  ibuprofen  (ADVIL ) 800 MG tablet Take 1 tablet by mouth every 8 (eight) hours as needed.    [provider]  latanoprost  (XALATAN ) 0.005 % ophthalmic solution Place 1 drop into both eyes at bedtime.    [provider]   losartan  (COZAAR ) 25 MG tablet Take 1 tablet (25 mg total) by mouth daily. 12/15/23   Aniceto Daphne CROME, NP  metoprolol  tartrate (LOPRESSOR ) 50 MG tablet Take 1 tablet (50 mg total) by mouth 2 (two) times daily. 12/15/23   Aniceto Daphne CROME, NP  omeprazole (PRILOSEC) 40 MG capsule Take 40 mg by mouth daily.    [provider]  oxyCODONE -acetaminophen  (PERCOCET/ROXICET) 5-325 MG tablet Take 1 tablet by mouth every 6 (six) hours as needed for severe pain (pain score 7-10). 12/03/23   Bethanie Cough, PA-C  polyethylene glycol powder (GLYCOLAX /MIRALAX ) 17 GM/SCOOP powder Mix as directed and take 1 capful (17 g) by mouth daily as needed for mild constipation. 11/17/23   Sherrill Cable Latif, DO  rosuvastatin  (CRESTOR ) 10 MG tablet Take 10 mg by mouth at bedtime.    [provider]  senna-docusate (SENOKOT-S) 8.6-50 MG tablet Take 1 tablet by mouth at bedtime. 11/17/23   Sherrill Cable Latif, DO  tiZANidine  (ZANAFLEX ) 4 MG tablet Take 1 tablet (4 mg total) by mouth 3 (three) times daily. 11/17/23   Sherrill Cable Donovan, DO    Allergies: Patient has no known allergies.    Review of Systems  Gastrointestinal:  Positive for abdominal pain, diarrhea, nausea and vomiting.  All other systems reviewed and are negative.   Updated Vital  Signs BP (!) 155/98 (BP Location: Right Arm)   Pulse 68   Temp 98.4 F (36.9 C) (Oral)   Resp 18   Ht 5' 7 (1.702 m)   Wt 76.2 kg   SpO2 100%   BMI 26.31 kg/m   Physical Exam Vitals and nursing note reviewed. Exam conducted with a chaperone present.  Constitutional:      Appearance: Normal appearance.  HENT:     Head: Normocephalic and atraumatic.     Right Ear: External ear normal.     Left Ear: External ear normal.     Nose: Nose normal.     Mouth/Throat:     Mouth: Mucous membranes are moist.     Pharynx: Oropharynx is clear.  Eyes:     Extraocular Movements: Extraocular movements intact.     Conjunctiva/sclera: Conjunctivae normal.      Pupils: Pupils are equal, round, and reactive to light.  Cardiovascular:     Rate and Rhythm: Normal rate and regular rhythm.     Pulses: Normal pulses.     Heart sounds: Normal heart sounds.  Pulmonary:     Effort: Pulmonary effort is normal.     Breath sounds: Normal breath sounds.  Abdominal:     General: Abdomen is flat. Bowel sounds are normal.     Palpations: Abdomen is soft.  Genitourinary:    Rectum: Guaiac result negative.  Musculoskeletal:        General: Normal range of motion.     Cervical back: Normal range of motion and neck supple.  Skin:    General: Skin is warm.     Capillary Refill: Capillary refill takes less than 2 seconds.  Neurological:     General: No focal deficit present.     Mental Status: He is alert and oriented to person, place, and time.  Psychiatric:        Mood and Affect: Mood normal.        Behavior: Behavior normal.     (all labs ordered are listed, but only abnormal results are displayed) Labs Reviewed  CBC WITH DIFFERENTIAL/PLATELET - Abnormal; Notable for the following components:      Result Value   RBC 4.16 (*)    Hemoglobin 12.5 (*)    HCT 37.4 (*)    All other components within normal limits  COMPREHENSIVE METABOLIC PANEL WITH GFR - Abnormal; Notable for the following components:   Potassium 3.3 (*)    Glucose, Bld 126 (*)    Creatinine, Ser 1.31 (*)    Total Protein 9.5 (*)    GFR, Estimated 55 (*)    All other components within normal limits  URINALYSIS, ROUTINE W REFLEX MICROSCOPIC - Abnormal; Notable for the following components:   Hgb urine dipstick SMALL (*)    All other components within normal limits  MAGNESIUM   POC OCCULT BLOOD, ED  TYPE AND SCREEN    EKG: EKG Interpretation Date/Time:  Monday May 30 2024 11:30:09 EDT Ventricular Rate:  59 PR Interval:  184 QRS Duration:  86 QT Interval:  408 QTC Calculation: 403 R Axis:   -29  Text Interpretation: Sinus bradycardia Otherwise normal ECG When compared  with ECG of 21-Mar-2024 10:42, PR interval has decreased No significant change since last tracing Confirmed by Dean Clarity [phone]) on 05/30/2024 1:17:44 PM  Radiology: CT ABDOMEN PELVIS W CONTRAST Result Date: 05/30/2024 CLINICAL DATA:  Melena, loss of appetite EXAM: CT ABDOMEN AND PELVIS WITH CONTRAST TECHNIQUE: Multidetector CT imaging of the abdomen  and pelvis was performed using the standard protocol following bolus administration of intravenous contrast. RADIATION DOSE REDUCTION: This exam was performed according to the departmental dose-optimization program which includes automated exposure control, adjustment of the mA and/or kV according to patient size and/or use of iterative reconstruction technique. CONTRAST:  80mL OMNIPAQUE  IOHEXOL  350 MG/ML SOLN COMPARISON:  CTA abdomen and pelvis and runoff dated 11/05/2023 FINDINGS: Lower chest: No focal consolidation or pulmonary nodule in the lung bases. No pleural effusion or pneumothorax demonstrated. Partially imaged heart size is normal. Coronary artery calcifications. Hepatobiliary: Diffuse parenchymal hypoattenuation can be seen with hepatic steatosis. No intra or extrahepatic biliary ductal dilation. Cholelithiasis. Pancreas: Pancreas divisum. No focal lesions or main ductal dilation. Spleen: Normal in size without focal abnormality. Adrenals/Urinary Tract: No adrenal nodules. No suspicious renal mass, calculi or hydronephrosis. No focal bladder wall thickening. Stomach/Bowel: Normal appearance of the stomach. Redundant appears of the proximal duodenum. No evidence of bowel wall thickening, distention, or inflammatory changes. Normal appendix. Vascular/Lymphatic: Aortic atherosclerosis. No enlarged abdominal or pelvic lymph nodes. Reproductive: Mildly enlarged prostate gland. Other: No free fluid, fluid collection, or free air. Musculoskeletal: No acute or abnormal lytic or blastic osseous lesions. Multilevel degenerative changes of the partially  imaged thoracic and lumbar spine. Partial fusion of L4-5. Postsurgical changes of the right inguinal region. Small bilateral fat containing inguinal hernias. IMPRESSION: 1. No acute abdominopelvic findings. 2. Hepatic steatosis. 3. Mildly enlarged prostate gland. 4.  Aortic Atherosclerosis (ICD10-I70.0). Electronically Signed   By: Limin  Xu M.D.   On: 05/30/2024 15:30     Procedures   Medications Ordered in the ED  sodium chloride  0.9 % bolus 500 mL (0 mLs Intravenous Stopped 05/30/24 1421)  iohexol  (OMNIPAQUE ) 350 MG/ML injection 80 mL (80 mLs Intravenous Contrast Given 05/30/24 1417)                                    Medical Decision Making Amount and/or Complexity of Data Reviewed Labs: ordered. Radiology: ordered.  Risk Prescription drug management.   This patient presents to the ED for concern of n/v/d, this involves an extensive number of treatment options, and is a complaint that carries with it a high risk of complications and morbidity.  The differential diagnosis includes sbo, colitis, cholecystitis, appendicitis, viral syn   Co morbidities that complicate the patient evaluation  htn, pvd, dm, atrial flutter (on Eliquis ) and CKD   Additional history obtained:  Additional history obtained from epic chart review External records from outside source obtained and reviewed including daughter   Lab Tests:  I Ordered, and personally interpreted labs.  The pertinent results include:  cbc with hgb 12.5 (8.1 in Jan); cmp with k low at 3.3; cr sl elevated at 1.31; UA neg for uti   Imaging Studies ordered:  I ordered imaging studies including ct abd/pelvis  I independently visualized and interpreted imaging which showed  No acute abdominopelvic findings.  2. Hepatic steatosis.  3. Mildly enlarged prostate gland.  4.  Aortic Atherosclerosis (ICD10-I70.0).   I agree with the radiologist interpretation   Medicines ordered and prescription drug management:  I ordered  medication including ivfs  for sx  Reevaluation of the patient after these medicines showed that the patient improved I have reviewed the patients home medicines and have made adjustments as needed   Test Considered:  ct   Critical Interventions:  ivfs  Problem List / ED Course:  Dehydration with n/v:  ct neg.  Labs without anything acute.  Pt is feeling much better.  He is tolerating fluids.  Pt is stable for d/c.  Return if worse.    Reevaluation:  After the interventions noted above, I reevaluated the patient and found that they have :improved   Social Determinants of Health:  Lives at home   Dispostion:  After consideration of the diagnostic results and the patients response to treatment, I feel that the patent would benefit from discharge with outpatient f/u.       Final diagnoses:  Dehydration  Nausea and vomiting, unspecified vomiting type    ED Discharge Orders          Ordered    ondansetron  (ZOFRAN -ODT) 4 MG disintegrating tablet  Every 8 hours PRN        05/30/24 1616               Dean Clarity, MD 05/30/24 1721

## 2024-08-09 ENCOUNTER — Telehealth: Payer: Self-pay

## 2024-08-09 NOTE — Telephone Encounter (Signed)
 Lindie Fuss called to report that Manuel Winters was experiencing intermittent right foot pain. She denies loss of sensation and loss of movement in the foot. Writer consulted with PA Ahmed Holster who advised that they may continue to monitor the foot and call this office if there is an increase in pain or loss of movement or sensation, change of color or if the foot becomes cold.  Bernice verbalized understanding of the above advice.

## 2024-11-03 ENCOUNTER — Other Ambulatory Visit: Payer: Self-pay

## 2024-11-03 DIAGNOSIS — I70213 Atherosclerosis of native arteries of extremities with intermittent claudication, bilateral legs: Secondary | ICD-10-CM

## 2024-11-08 ENCOUNTER — Encounter (HOSPITAL_COMMUNITY)

## 2024-11-08 ENCOUNTER — Ambulatory Visit: Admitting: Vascular Surgery

## 2024-11-22 ENCOUNTER — Ambulatory Visit: Admitting: Vascular Surgery

## 2024-11-22 ENCOUNTER — Ambulatory Visit (HOSPITAL_COMMUNITY)

## 2024-11-30 ENCOUNTER — Ambulatory Visit (HOSPITAL_COMMUNITY)

## 2024-12-13 ENCOUNTER — Ambulatory Visit: Admitting: Vascular Surgery
